# Patient Record
Sex: Male | Born: 1940 | Race: White | Hispanic: No | Smoking: Never smoker
Health system: Southern US, Community
[De-identification: ages and names within clinical notes are randomized; demographics above are authoritative.]

## PROBLEM LIST (undated history)

## (undated) DIAGNOSIS — I480 Paroxysmal atrial fibrillation: Secondary | ICD-10-CM

## (undated) DIAGNOSIS — E782 Mixed hyperlipidemia: Secondary | ICD-10-CM

## (undated) DIAGNOSIS — M1990 Unspecified osteoarthritis, unspecified site: Secondary | ICD-10-CM

## (undated) DIAGNOSIS — I2510 Atherosclerotic heart disease of native coronary artery without angina pectoris: Secondary | ICD-10-CM

## (undated) DIAGNOSIS — Z8701 Personal history of pneumonia (recurrent): Secondary | ICD-10-CM

## (undated) DIAGNOSIS — I219 Acute myocardial infarction, unspecified: Secondary | ICD-10-CM

## (undated) DIAGNOSIS — Z87442 Personal history of urinary calculi: Secondary | ICD-10-CM

## (undated) DIAGNOSIS — R252 Cramp and spasm: Secondary | ICD-10-CM

## (undated) DIAGNOSIS — I10 Essential (primary) hypertension: Secondary | ICD-10-CM

## (undated) DIAGNOSIS — F5221 Male erectile disorder: Secondary | ICD-10-CM

## (undated) DIAGNOSIS — N200 Calculus of kidney: Secondary | ICD-10-CM

## (undated) HISTORY — DX: Essential (primary) hypertension: I10

## (undated) HISTORY — PX: TOOTH EXTRACTION: SUR596

## (undated) HISTORY — DX: Mixed hyperlipidemia: E78.2

## (undated) HISTORY — DX: Male erectile disorder: F52.21

## (undated) HISTORY — DX: Paroxysmal atrial fibrillation: I48.0

## (undated) HISTORY — DX: Personal history of pneumonia (recurrent): Z87.01

## (undated) HISTORY — DX: Atherosclerotic heart disease of native coronary artery without angina pectoris: I25.10

## (undated) HISTORY — PX: CORONARY ANGIOPLASTY: SHX604

## (undated) HISTORY — PX: RETINAL DETACHMENT SURGERY: SHX105

## (undated) HISTORY — PX: BACK SURGERY: SHX140

---

## 1998-07-10 DIAGNOSIS — I219 Acute myocardial infarction, unspecified: Secondary | ICD-10-CM

## 1998-07-10 HISTORY — DX: Acute myocardial infarction, unspecified: I21.9

## 2003-01-21 ENCOUNTER — Encounter: Payer: Self-pay | Admitting: Cardiology

## 2003-01-21 ENCOUNTER — Ambulatory Visit (HOSPITAL_COMMUNITY): Admission: RE | Admit: 2003-01-21 | Discharge: 2003-01-22 | Payer: Self-pay | Admitting: Cardiology

## 2003-03-14 ENCOUNTER — Encounter: Payer: Self-pay | Admitting: Emergency Medicine

## 2003-03-14 ENCOUNTER — Emergency Department (HOSPITAL_COMMUNITY): Admission: EM | Admit: 2003-03-14 | Discharge: 2003-03-14 | Payer: Self-pay | Admitting: Emergency Medicine

## 2003-11-06 ENCOUNTER — Ambulatory Visit (HOSPITAL_COMMUNITY): Admission: RE | Admit: 2003-11-06 | Discharge: 2003-11-06 | Payer: Self-pay | Admitting: Cardiology

## 2004-07-21 ENCOUNTER — Ambulatory Visit: Payer: Self-pay | Admitting: Cardiology

## 2004-09-27 ENCOUNTER — Ambulatory Visit: Payer: Self-pay | Admitting: Gastroenterology

## 2005-07-28 ENCOUNTER — Ambulatory Visit: Payer: Self-pay | Admitting: Cardiology

## 2006-02-15 ENCOUNTER — Emergency Department: Payer: Self-pay

## 2006-03-08 ENCOUNTER — Ambulatory Visit: Payer: Self-pay | Admitting: Urology

## 2006-09-10 ENCOUNTER — Ambulatory Visit: Payer: Self-pay | Admitting: Urology

## 2006-10-11 ENCOUNTER — Ambulatory Visit: Payer: Self-pay | Admitting: Cardiology

## 2006-10-26 ENCOUNTER — Ambulatory Visit: Payer: Self-pay

## 2006-12-11 ENCOUNTER — Ambulatory Visit: Payer: Self-pay | Admitting: Gastroenterology

## 2007-06-12 ENCOUNTER — Ambulatory Visit: Payer: Self-pay | Admitting: Urology

## 2007-11-26 ENCOUNTER — Ambulatory Visit: Payer: Self-pay | Admitting: Cardiology

## 2008-06-10 ENCOUNTER — Ambulatory Visit: Payer: Self-pay | Admitting: Urology

## 2008-06-11 ENCOUNTER — Ambulatory Visit: Payer: Self-pay | Admitting: Internal Medicine

## 2008-09-25 ENCOUNTER — Encounter: Payer: Self-pay | Admitting: Cardiovascular Disease

## 2008-12-14 DIAGNOSIS — E785 Hyperlipidemia, unspecified: Secondary | ICD-10-CM | POA: Insufficient documentation

## 2008-12-14 DIAGNOSIS — I2510 Atherosclerotic heart disease of native coronary artery without angina pectoris: Secondary | ICD-10-CM

## 2008-12-14 DIAGNOSIS — F528 Other sexual dysfunction not due to a substance or known physiological condition: Secondary | ICD-10-CM | POA: Insufficient documentation

## 2008-12-14 DIAGNOSIS — I10 Essential (primary) hypertension: Secondary | ICD-10-CM

## 2008-12-28 ENCOUNTER — Encounter: Payer: Self-pay | Admitting: Internal Medicine

## 2008-12-28 ENCOUNTER — Ambulatory Visit: Payer: Self-pay | Admitting: Internal Medicine

## 2008-12-28 DIAGNOSIS — S60429A Blister (nonthermal) of unspecified finger, initial encounter: Secondary | ICD-10-CM | POA: Insufficient documentation

## 2009-06-14 ENCOUNTER — Ambulatory Visit: Payer: Self-pay | Admitting: Urology

## 2009-12-24 ENCOUNTER — Ambulatory Visit: Payer: Self-pay | Admitting: Cardiovascular Disease

## 2009-12-24 DIAGNOSIS — R5383 Other fatigue: Secondary | ICD-10-CM

## 2009-12-24 DIAGNOSIS — R079 Chest pain, unspecified: Secondary | ICD-10-CM | POA: Insufficient documentation

## 2009-12-24 DIAGNOSIS — R5381 Other malaise: Secondary | ICD-10-CM

## 2009-12-28 ENCOUNTER — Telehealth (INDEPENDENT_AMBULATORY_CARE_PROVIDER_SITE_OTHER): Payer: Self-pay | Admitting: *Deleted

## 2009-12-29 ENCOUNTER — Ambulatory Visit: Payer: Self-pay | Admitting: Cardiology

## 2009-12-29 ENCOUNTER — Encounter (HOSPITAL_COMMUNITY): Admission: RE | Admit: 2009-12-29 | Discharge: 2010-03-16 | Payer: Self-pay | Admitting: Cardiovascular Disease

## 2009-12-29 ENCOUNTER — Encounter: Payer: Self-pay | Admitting: Cardiology

## 2009-12-29 ENCOUNTER — Ambulatory Visit: Payer: Self-pay

## 2010-06-22 ENCOUNTER — Ambulatory Visit: Payer: Self-pay | Admitting: Urology

## 2010-06-28 ENCOUNTER — Encounter: Payer: Self-pay | Admitting: Cardiovascular Disease

## 2010-06-28 ENCOUNTER — Ambulatory Visit: Payer: Self-pay | Admitting: Cardiovascular Disease

## 2010-06-28 DIAGNOSIS — R42 Dizziness and giddiness: Secondary | ICD-10-CM

## 2010-08-09 NOTE — Assessment & Plan Note (Signed)
Summary: Cardiology Nuclear Study  Nuclear Med Background Indications for Stress Test: Evaluation for Ischemia, Stent Patency, PTCA Patency   History: Angioplasty, Heart Catheterization, Myocardial Perfusion Study, Stents  History Comments: '04 PTCA/Stent-RCA x 2; '05 Cath:Moderate, diffuse n/o CAD; '08 MPS:no ischemia, inferior infarct, EF=54%  Symptoms: Chest Pain, Chest Tightness  Symptoms Comments: Last episode of CP:10-days ago.   Nuclear Pre-Procedure Cardiac Risk Factors: Carotid Disease, Hypertension, Lipids, Overweight Caffeine/Decaff Intake: None NPO After: 7:00 PM Lungs: Clear IV 0.9% NS with Angio Cath: 20g     IV Site: (R) AC IV Started by: Irean Hong RN Chest Size (in) 50     Height (in): 74 Weight (lb): 234 BMI: 30.15 Tech Comments: Toprol held > 24 hours.  Nuclear Med Study 1 or 2 day study:  1 day     Stress Test Type:  Stress Reading MD:  Olga Millers, MD     Referring MD:  Julien Nordmann, MD Resting Radionuclide:  Technetium 36m Tetrofosmin     Resting Radionuclide Dose:  11 mCi  Stress Radionuclide:  Technetium 4m Tetrofosmin     Stress Radionuclide Dose:  33 mCi   Stress Protocol Exercise Time (min):  8:00 min     Max HR:  118 bpm     Predicted Max HR:  150 bpm  Max Systolic BP: 172 mm Hg     Percent Max HR:  78.67 %     METS: 10.1 Rate Pressure Product:  [address]    Stress Test Technologist:  Rea College CMA-N     Nuclear Technologist:  Domenic Polite CNMT  Rest Procedure  Myocardial perfusion imaging was performed at rest 45 minutes following the intravenous administration of Myoview Technetium 47m Tetrofosmin.  Stress Procedure  The patient exercised for eight minutes.  The patient stopped due to fatigue and denied any chest pain.  There were no significant ST-T wave changes, only occasional PVC's.  Myoview was injected at peak exercise and myocardial perfusion imaging was performed after a brief delay.  QPS Raw Data Images:  Acuisition  technically good; normal left ventricular size. Stress Images:  There is decreased uptake in the inferior wall. Rest Images:  There is decreased uptake in the inferior wall, less prominent compared to the stress images. Subtraction (SDS):  These findings are consistent with inferior thinning and mild inferior ischemia. Transient Ischemic Dilatation:  1.03  (Normal <1.22)  Lung/Heart Ratio:  .33  (Normal <0.45)  Quantitative Gated Spect Images QGS EDV:  125 ml QGS ESV:  55 ml QGS EF:  56 % QGS cine images:  Normal wall motion.   Overall Impression  Exercise Capacity: Fair exercise capacity. BP Response: Normal blood pressure response. Clinical Symptoms: There is dyspnea. ECG Impression: No significant ST segment change suggestive of ischemia. Overall Impression: Abnormal stress nuclear study with inferior thinning and mild inferior ischemia.  Appended Document: Cardiology Nuclear Study Appears unchanged from previous study  Appended Document: Cardiology Nuclear Study LMOM TCB   Appended Document: Cardiology Nuclear Study pt aware of test results.

## 2010-08-09 NOTE — Assessment & Plan Note (Signed)
Summary: EC6/AMD   Visit Type:  Follow-up Primary Provider:  [name]  CC:  chest pain last sunday when sleeping.  History of Present Illness: [name] is a very pleasant 70 year old male with a history of coronary artery disease.  He is status post previous stenting of the LAD and right coronary artery with drug-eluting stents.  Most recent catheterization was in April 2005, which showed moderate diffuse nonobstructive disease.  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 EF 54%.  He also has a history of hypertension and hyperlipidemia as well as kidney stones.   Typically he does aerobic 4 times per week, does weights 2 times per week. He reports that he likes to eat a lot the burns this on the treadmill. He had an episode of chest pain last week at rest lasting several hours, it was dull with significant tightness. He took a large aspirin and it seemed to go away without having to take any other medications. He also reports feeling hot sometimes when working out on the treadmill. chest pain episode was similar to previous symptoms in 2005.    Current Medications (verified): 1)  Plavix 75 Mg Tabs (Clopidogrel Bisulfate) .... Take 1 Tablet By Mouth Once A Day 2)  Toprol Xl 50 Mg Xr24h-Tab (Metoprolol Succinate) 3)  Zocor 40 Mg Tabs (Simvastatin) .... Take 1 By Mouth At Bedtime 4)  Folic Acid 400 Mcg Tabs (Folic Acid) .... Take 1 By Mouth Once Daily 5)  Aspirin 81 Mg Tbec (Aspirin) .... Take One Tablet By Mouth Daily 6)  Fish Oil 1000 Mg Caps (Omega-3 Fatty Acids) .... Take 1 By Mouth Two Times A Day 7)  Centrum  Tabs (Multiple Vitamins-Minerals) .... Take 1 By Mouth Once Daily 8)  Calcium 600 1500 Mg Tabs (Calcium Carbonate) .... Take 1 By Mouth Once Daily 9)  Ibuprofen 200 Mg Caps (Ibuprofen) .... Take 2-4 As Needed 10)  Allegra Odt 30 Mg Tbdp (Fexofenadine Hcl) .... Take 1 Tablet By Mouth Once A Day As Needed For Allergy  Symptions  Allergies (verified): 1)  ! Codeine  Past History:  Past Medical History: Last updated: 12/28/2008 1. CAD, UNSPECIFIED SITE    --s/p DES to LAD and RCA 2. HYPERLIPIDEMIA-MIXED  3. HYPERTENSION, UNSPECIFIED  4. ERECTILE DYSFUNCTION, NON-ORGANIC   Social History: Last updated: 12/14/2008 Full Time Married  Tobacco Use - No.  Alcohol Use - no Regular Exercise - yes Drug Use - no  Risk Factors: Exercise: yes (12/14/2008)  Risk Factors: Smoking Status: never (12/14/2008)  Review of Systems       The patient complains of chest pain.  The patient denies fever, weight loss, weight gain, vision loss, decreased hearing, hoarseness, syncope, dyspnea on exertion, peripheral edema, prolonged cough, abdominal pain, incontinence, muscle weakness, depression, and enlarged lymph nodes.         Feels hot on the treadmill  Vital Signs:  Patient profile:   69 year old male Height:      74 inches Weight:      239 pounds BMI:     30 .80 Pulse rate:   60 / minute BP sitting:   122 / 80  (left arm) Cuff size:   large  Vitals Entered By: Benedict Needy, RN (December 24, 2009 2:31 PM)  Physical Exam  General:  Well developed, well nourished, in no acute distress. Head:  normocephalic and atraumatic Neck:  Neck supple, no JVD. No masses, thyromegaly or  abnormal cervical nodes. Lungs:  Clear bilaterally to auscultation and percussion. Heart:  Non-displaced PMI, chest non-tender; regular rate and rhythm, S1, S2 without murmurs, rubs or gallops. Carotid upstroke normal, no bruit.  Pedals normal pulses. No edema, no varicosities. Abdomen:  Bowel sounds positive; abdomen soft and non-tender without masses Msk:  Back normal, normal gait. Muscle strength and tone normal. Pulses:  pulses normal in all 4 extremities Extremities:  No clubbing or cyanosis. Neurologic:  Alert and oriented x 3. Skin:  Intact without lesions or rashes. Psych:  Normal affect.   New Orders:     1)  EKG  w/ Interpretation (93000)  Due: 12/24/2009     2)  Nuclear Stress Test (Nuc Stress Test)  Due: 12/24/2009   EKG  Procedure date:  12/24/2009  Findings:      normal sinus rhythm with a rate of 60 beats per minute, first degree AV block. No significant ST or T wave changes.  Impression & Recommendations:  Problem # 1:  CHEST PAIN (ICD-786.50) etiology of his chest pain is concerning for angina. He has known coronary artery disease, History of PCI to the LAD and RCA. history of obesity, hyperlipidemia, hypertension. We have ordered a treadmill Myoview to be done in the next week We will call him with the results.  The following medications were removed from the medication list:    Amlodipine Besylate 5 Mg Tabs (Amlodipine besylate) .Marland Kitchen... Take one tablet by mouth daily His updated medication list for this problem includes:    Plavix 75 Mg Tabs (Clopidogrel bisulfate) .Marland Kitchen... Take 1 tablet by mouth once a day    Toprol Xl 50 Mg Xr24h-tab (Metoprolol succinate)    Aspirin 81 Mg Tbec (Aspirin) .Marland Kitchen... Take one tablet by mouth daily  Orders: Nuclear Stress Test (Nuc Stress Test)  Problem # 2:  HYPERLIPIDEMIA (ICD-272.4) new FDA recommendations have suggested we decrease his Zocor to 20 mg daily as he is on amlodipine. We will discuss this with him in followup with his stress test.  His updated medication list for this problem includes:    Zocor 40 Mg Tabs (Simvastatin) .Marland Kitchen... Take 1 by mouth at bedtime  Problem # 3:  HYPERLIPIDEMIA-MIXED (ICD-272.4) We will try to obtain his most recent lipid panel from Dr. Judithann Sheen. If he is at goal, with LDL less than 70, decreasing his simvastatin to 20 mg daily may not be sufficient and we may need to change him to Lipitor or Crestor.  His updated medication list for this problem includes:    Zocor 40 Mg Tabs (Simvastatin) .Marland Kitchen... Take 1 by mouth at bedtime  Orders: Nuclear Stress Test (Nuc Stress Test)  Other Orders: EKG w/ Interpretation  (93000)  Patient Instructions: 1)  Your physician recommends that you schedule a follow-up appointment in: 6 months 2)  Your physician has requested that you have an exercise stress myoview.  For further information please visit [email or web address]  Please follow instruction sheet, as given.

## 2010-08-09 NOTE — Progress Notes (Signed)
Summary: Nuclear Pre-Procedure  Phone Note Outgoing Call Call back at [facility] Phone [phone]   Call placed by: Stanton Kidney, EMT-P,  December 28, 2009 11:10 AM Action Taken: Phone Call Completed Summary of Call: Left message with information on Myoview Information Sheet (see scanned document for details).     Nuclear Med Background Indications for Stress Test: Evaluation for Ischemia, Stent Patency   History: Angioplasty, Heart Catheterization, Myocardial Perfusion Study, Stents  History Comments: '04 Angioplasty: RCA x2 > Stents: RCA x2 '05 Heart Cath: Mod. diffuse N/O CAD '08 MPS: EF=54%, (-) ischemia, inf. infarct  Symptoms: Chest Pain, Chest Tightness    Nuclear Pre-Procedure Cardiac Risk Factors: Carotid Disease, Hypertension, Lipids Height (in): 74

## 2010-08-11 NOTE — Assessment & Plan Note (Signed)
Summary: F6M/AMD   Visit Type:  Follow-up Primary [name]:  [name]  CC:  c/o mild chest pain for several months.  .  History of Present Illness: [name] is a very pleasant 70 year old male with a history of coronary artery disease.  He is status post previous stenting of the LAD and right coronary artery with drug-eluting stents.  Most recent catheterization was in April 2005, which showed moderate diffuse nonobstructive disease.  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 EF 54%.  He also has a history of hypertension and hyperlipidemia as well as kidney stones. He presents for routine follow up.  [name] reports that he has had dizziness with lying down and sometimes sitting up. Sometimes he notices this when he lies down to do bench press at the gym. He continues to walk on the treadmill and has no symptoms. He works out 4 times per week. No shortness of breath. He works for at least 4 miles at a time. he has had a recent sinus infection  He also reports having a little bit of chest tightness that comes on at night time or typically at rest. This is very light it does not come on with exertion  EKG shows normal sinus rhythm with rate of 59 beats per minute, first degree AV block, no other significant ST or T wave changes  Labs from March 2010 showed total cholesterol 131, LDL 85, HDL 38    Current Medications (verified): 1)  Plavix 75 Mg Tabs (Clopidogrel Bisulfate) .... Take 1 Tablet By Mouth Once A Day 2)  Toprol Xl 50 Mg Xr24h-Tab (Metoprolol Succinate) .... One Tablet Once Daily 3)  Zocor 40 Mg Tabs (Simvastatin) .... Take 1 By Mouth At Bedtime 4)  Folic Acid 400 Mcg Tabs (Folic Acid) .... Take 1 By Mouth Once Daily 5)  Aspirin 81 Mg Tbec (Aspirin) .... Take One Tablet By Mouth Daily 6)  Fish Oil 1000 Mg Caps (Omega-3 Fatty Acids) .... Take 1 By Mouth Two Times A Day 7)  Centrum  Tabs (Multiple Vitamins-Minerals)  .... Take 1 By Mouth Once Daily  Allergies (verified): 1)  ! Codeine  Past History:  Past Medical History: Last updated: 12/28/2008 1. CAD, UNSPECIFIED SITE    --s/p DES to LAD and RCA 2. HYPERLIPIDEMIA-MIXED  3. HYPERTENSION, UNSPECIFIED  4. ERECTILE DYSFUNCTION, NON-ORGANIC   Social History: Last updated: 12/14/2008 Full Time Married  Tobacco Use - No.  Alcohol Use - no Regular Exercise - yes Drug Use - no  Risk Factors: Exercise: yes (12/14/2008)  Risk Factors: Smoking Status: never (12/14/2008)  Review of Systems       The patient complains of chest pain.  The patient denies fever, weight loss, weight gain, vision loss, decreased hearing, hoarseness, syncope, dyspnea on exertion, peripheral edema, prolonged cough, abdominal pain, incontinence, muscle weakness, depression, and enlarged lymph nodes.         Dizzy  Vital Signs:  Patient profile:   70 year old male Height:      74 inches Weight:      238 pounds BMI:     30.67 Pulse rate:   59 / minute BP sitting:   154 / 92  (left arm) Cuff size:   large  Vitals Entered By: Bishop Dublin, CMA (June 28, 2010 10:54 AM)  Physical Exam  General:  Well developed, well nourished, in no acute distress. Head:  normocephalic and atraumatic Neck:  Neck supple, no JVD. No masses, thyromegaly or abnormal cervical nodes. Lungs:  Clear bilaterally to auscultation and percussion. Heart:  Non-displaced PMI, chest non-tender; regular rate and rhythm, S1, S2 without murmurs, rubs or gallops. Carotid upstroke normal, no bruit.  Pedals normal pulses. No edema, no varicosities. Abdomen:  Bowel sounds positive; abdomen soft and non-tender without masses Msk:  Back normal, normal gait. Muscle strength and tone normal. Pulses:  pulses normal in all 4 extremities Extremities:  No clubbing or cyanosis. Neurologic:  Alert and oriented x 3. Skin:  Intact without lesions or rashes. Psych:  Normal affect.   Impression &  Recommendations:  Problem # 1:  CHEST PAIN (ICD-786.50) Symptoms are atypical in nature. He is exercising with no symptoms. I have suggested that we monitor him for now.  His updated medication list for this problem includes:    Plavix 75 Mg Tabs (Clopidogrel bisulfate) .Marland Kitchen... Take 1 tablet by mouth once a day    Toprol Xl 50 Mg Xr24h-tab (Metoprolol succinate) ..... One tablet once daily    Aspirin 81 Mg Tbec (Aspirin) .Marland Kitchen... Take one tablet by mouth daily  Problem # 2:  DIZZINESS (ICD-780.4) Dizziness with lying back is likely secondary to vestibular/inner ear disease. Unlikely to be cardiac. He does have a recent sinus infection.  Problem # 3:  CAD, UNSPECIFIED SITE (ICD-414.00) continue aggressive lipid management. No further testing at this time.  His updated medication list for this problem includes:    Plavix 75 Mg Tabs (Clopidogrel bisulfate) .Marland Kitchen... Take 1 tablet by mouth once a day    Toprol Xl 50 Mg Xr24h-tab (Metoprolol succinate) ..... One tablet once daily    Aspirin 81 Mg Tbec (Aspirin) .Marland Kitchen... Take one tablet by mouth daily  Problem # 4:  HYPERLIPIDEMIA-MIXED (ICD-272.4) We have suggested to him that he have a new lipid panel as his last was March 2010.  His updated medication list for this problem includes:    Zocor 40 Mg Tabs (Simvastatin) .Marland Kitchen... Take 1 by mouth at bedtime  Patient Instructions: 1)  Your physician recommends that you schedule a follow-up appointment in: 6 months 2)  Your physician recommends that you continue on your current medications as directed. Please refer to the Current Medication list given to you today. 3)  Your physician has requested that you regularly monitor and record your blood pressure readings at home.  Please use the same machine at the same time of day to check your readings and record them to bring to your follow-up visit.

## 2010-08-12 ENCOUNTER — Encounter: Payer: Self-pay | Admitting: Cardiovascular Disease

## 2010-08-12 ENCOUNTER — Ambulatory Visit (INDEPENDENT_AMBULATORY_CARE_PROVIDER_SITE_OTHER): Payer: MEDICARE | Admitting: Cardiovascular Disease

## 2010-08-12 DIAGNOSIS — I10 Essential (primary) hypertension: Secondary | ICD-10-CM

## 2010-08-12 DIAGNOSIS — I2510 Atherosclerotic heart disease of native coronary artery without angina pectoris: Secondary | ICD-10-CM

## 2010-08-12 DIAGNOSIS — E785 Hyperlipidemia, unspecified: Secondary | ICD-10-CM

## 2010-08-17 ENCOUNTER — Telehealth: Payer: Self-pay | Admitting: Cardiovascular Disease

## 2010-08-17 NOTE — Assessment & Plan Note (Signed)
Summary: ELEVATED BP/ 183/99 /AMD   Vital Signs:  Patient profile:   70 year old male Height:      74 inches Weight:      240 pounds BMI:     30.93 Pulse rate:   64 / minute BP sitting:   158 / 89  (left arm) Cuff size:   large  Vitals Entered By: Bishop Dublin, CMA (August 12, 2010 2:47 PM)  Visit Type:  Follow-up Primary Provider:  [name]  CC:  Elevated blood pressure and dizziness and headache..  History of Present Illness: 70 year old male with a history of coronary artery disease.  He is status post previous stenting of the LAD and right coronary artery with drug-eluting stents.  Most recent catheterization was in April 2005, which showed moderate diffuse nonobstructive disease.  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 EF 54%.  He also has a history of hypertension and hyperlipidemia as well as kidney stones. He presents for routine follow up.  He has had problems with chronic dizziness and ringing in his ears. He believes this is secondary to an inner ear problem. Overall he feels well though he is very concerned about hypertension recently. He reports a systolic pressure in the 180 range recently. He has not been checking this at home as he does not have a blood pressure cuff. He would like to start a medication for his blood pressure.  He denies any significant chest pain. He has been using a treadmill several times per week with no symptoms of shortness of breath or discomfort. his weight has been increasing over the past several years. He does eat out frequently/most nights.  old EKG shows normal sinus rhythm with rate of 59 beats per minute, first degree AV block, no other significant ST or T wave changes  Labs from March 2010 showed total cholesterol 131, LDL 85, HDL 38    Current Medications (verified): 1)  Plavix 75 Mg Tabs (Clopidogrel Bisulfate) .... Take 1 Tablet By Mouth Once A Day 2)  Toprol Xl 50  Mg Xr24h-Tab (Metoprolol Succinate) .... One Tablet Once Daily 3)  Zocor 40 Mg Tabs (Simvastatin) .... Take 1 By Mouth At Bedtime 4)  Folic Acid 400 Mcg Tabs (Folic Acid) .... Take 1 By Mouth Once Daily 5)  Aspirin 81 Mg Tbec (Aspirin) .... Take One Tablet By Mouth Daily 6)  Fish Oil 1000 Mg Caps (Omega-3 Fatty Acids) .... Take 1 By Mouth Two Times A Day 7)  Centrum  Tabs (Multiple Vitamins-Minerals) .... Take 1 By Mouth Once Daily  Allergies (verified): 1)  ! Codeine  Past History:  Past Medical History: Last updated: 12/28/2008 1. CAD, UNSPECIFIED SITE    --s/p DES to LAD and RCA 2. HYPERLIPIDEMIA-MIXED  3. HYPERTENSION, UNSPECIFIED  4. ERECTILE DYSFUNCTION, NON-ORGANIC   Social History: Last updated: 12/14/2008 Full Time Married  Tobacco Use - No.  Alcohol Use - no Regular Exercise - yes Drug Use - no  Risk Factors: Exercise: yes (12/14/2008)  Risk Factors: Smoking Status: never (12/14/2008)  Review of Systems  The patient denies fever, weight loss, weight gain, vision loss, decreased hearing, hoarseness, chest pain, syncope, dyspnea on exertion, peripheral edema, prolonged cough, abdominal pain, incontinence, muscle weakness, depression, and enlarged lymph nodes.         dizziness and ringing in his ears  Physical Exam  General:  Well developed, well nourished, in no acute distress.obese Head:  normocephalic and atraumatic Neck:  Neck supple, no JVD. No masses, thyromegaly or abnormal cervical nodes. Lungs:  Clear bilaterally to auscultation and percussion. Heart:  Non-displaced PMI, chest non-tender; regular rate and rhythm, S1, S2 without murmurs, rubs or gallops. Carotid upstroke normal, no bruit.  Pedals normal pulses. No edema, no varicosities. Abdomen:  Bowel sounds positive; abdomen soft and non-tender without masses Msk:  Back normal, normal gait. Muscle strength and tone normal. Pulses:  pulses normal in all 4 extremities Extremities:  No clubbing or  cyanosis. Neurologic:  Alert and oriented x 3. Skin:  Intact without lesions or rashes. Psych:  Normal affect.   Impression & Recommendations:  Problem # 1:  HYPERTENSION, UNSPECIFIED (ICD-401.9) his blood pressure is elevated. We will start him on lisinopril 20 mg daily. Alternatively, we could try HCTZ or amlodipine. I've asked him to monitor his blood pressure and call us in the next 2 weeks to confirm his blood pressure is within an adequate range  His updated medication list for this problem includes:    Toprol Xl 50 Mg Xr24h-tab (Metoprolol succinate) ..... One tablet once daily    Aspirin 81 Mg Tbec (Aspirin) .Marland Kitchen... Take one tablet by mouth daily    Lisinopril 20 Mg Tabs (Lisinopril) .Marland Kitchen... 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  Problem # 3:  HYPERLIPIDEMIA (ICD-272.4) Continue Zocor. We'll try to obtain the most recent cholesterol from Dr. Judithann Sheen.  His updated medication list for this problem includes:    Zocor 40 Mg Tabs (Simvastatin) .Marland Kitchen... Take 1 by mouth at bedtime  Problem # 4:  CAD, UNSPECIFIED SITE (ICD-414.00) Continue aggressive medical management and lipid control.  His updated medication list for this problem includes:    Plavix 75 Mg Tabs (Clopidogrel bisulfate) .Marland Kitchen... Take 1 tablet by mouth once a day    Toprol Xl 50 Mg Xr24h-tab (Metoprolol succinate) ..... One tablet once daily    Aspirin 81 Mg Tbec (Aspirin) .Marland Kitchen... Take one tablet by mouth daily    Lisinopril 20 Mg Tabs (Lisinopril) .Marland Kitchen... Take one tablet by mouth daily  Patient Instructions: 1)  Your physician recommends that you schedule a follow-up appointment in: 6 months 2)  Your physician has recommended you make the following change in your medication: START Lisinopril 20mg  once daily. Prescriptions: LISINOPRIL 20 MG TABS (LISINOPRIL) Take one tablet by mouth daily  #30 x 6   Entered by:   Lanny Hurst RN    Authorized by:   Dossie Arbour MD   Signed by:   Lanny Hurst RN on 08/12/2010   Method used:   Electronically to        [facility] [address] (retail)       [address]       [place], [place]  [address]       Ph: [phone]       Fax: [phone]   RxID:   [id number]

## 2010-08-31 NOTE — Progress Notes (Signed)
Summary: [facility]  Called pt, LMOM TCB. Lanny Hurst RN  August 17, 2010 3:59 PM   ---- Converted from flag ---- ---- 08/16/2010 9:17 PM, Dossie Arbour MD wrote: maybe just have him  go to [facility] for lipids/LFTs    ---- 08/15/2010 9:27 AM, Lanny Hurst RN wrote: Requested labs from Dr. Judithann Sheen, they have no record of chol levels. Just FYI. Will contact pt to see if another MD had done these. Thanks, Megan  Pt thinks he did have chol drawn at Dr. Judithann Sheen, pt will call there and let us know. If no results, pt will have them drawn at [facility]. Lanny Hurst RN  August 18, 2010 10:39 AM  ------------------------------

## 2010-11-22 NOTE — Assessment & Plan Note (Signed)
[facility] OFFICE NOTE   NAME:[name], [name]                     MRN:          [id number]  DATE:11/26/2007                            DOB:          08-14-1940    [name] is a pleasant 70 year old male who has a history of coronary  disease and he has had prior drug-eluting stent to his right coronary  artery.  His most recent catheterization was performed on November 06, 2003.  At that time he was found to have minor irregularities in his  left main.  There was a 60% ostial LAD stenosis.  The stent in the  proximal LAD had no significant restenosis.  There was a 50% mid LAD.  The circumflex had a large first obtuse marginal which had a 70%  stenosis in the mid area.  A second smaller second obtuse marginal had a  90% stenosis at origin.  There was a 60-70% stenosis in proximal right  coronary artery but there was no restenosis in the stent.  The PDA had a  70-80% lesion.  He has been treated medically.  His most recent Myoview  was performed on October 26, 2006.  At that time there was a prior  inferior infarct versus diaphragmatic attenuation but there was no  ischemia noted.  His ejection fraction was 54%.  Since he was seen  previously he has done well from a symptomatic standpoint.  There is no  dyspnea, chest pain, palpitations or syncope, and there is no  claudication.  Note, he does not smoke.  He does exercise routinely and  does follow a diet as well.   MEDICATIONS:  1. Plavix 75 mg p.o. daily.  2. Toprol 50 mg p.o. daily.  3. Folate daily.  4. Calcium.  5. Fish oil 1 g p.o. daily.  6. Aspirin 81 mg p.o. daily.  7. Zocor 40 mg p.o. daily.  8. Multivitamin.   Physical exam today shows a blood pressure of 155/86 and his pulse [place]  53.  He weighs 224 pounds.  HEENT:  Normal.  NECK:  Supple.  There are no bruits noted.  CHEST:  Clear.  CARDIOVASCULAR:  A bradycardic rate with a regular rhythm.   There are no  murmurs, rubs or gallops noted.  ABDOMEN:  No pulsatile mass and no bruit.  EXTREMITIES:  No edema.   Electrocardiogram shows a sinus bradycardia at a rate of 53.  The axis  is normal.  There is a first-degree AV block.  There are no ST changes  noted.   DIAGNOSES:  1. Coronary artery disease.  [name] is doing well from a      symptomatic standpoint with no chest pain or shortness of breath.      His most recent Myoview showed no ischemia and preserved left      ventricular function.  We will continue with medical therapy      including his aspirin, Plavix, beta blocker and statin.  Note, he      will continue with risk factor modification.  He does not smoke.  He is exercising routinely and following a diet.  2. Hypertension.  His blood pressure is mildly elevated today.      However, he states it typically runs better than this.  He will      track this at home.  I have instructed him to contact us if the      systolic is greater than 130 or diastolic greater than 85.  If so,      we could add an ACE inhibitor in the future.  3. Hyperlipidemia.  He will continue on his statin and fish oil and we      will have his most recent lipids and liver forward to [place] from Dr.      Judithann Sheen' office.   He will return to the [place] office in 1 year.     Madolyn Frieze Jens Som, MD, [facility]  Electronically Signed    BSC/MedQ  DD: 11/26/2007  DT: 11/26/2007  Job #: [id number]   cc:   Alla German, MD

## 2010-11-22 NOTE — Assessment & Plan Note (Signed)
[facility] OFFICE NOTE   NICOLAI, LABONTE                     MRN:          [id number]  DATE:06/11/2008                            DOB:          [birth year]    PRIMARY CARE PHYSICIAN:  Duane Lope. Judithann Sheen, MD   INTERVAL HISTORY:  [name] is a very pleasant 70 year old male with  a history of coronary artery disease.  He is status post previous  stenting of the LAD and right coronary artery.  This was with drug-  eluting stents.  Most recent catheterization was in April 2005, which  showed moderate diffuse nonobstructive disease.  He did have a Myoview  in April 2008 which showed a 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  There is an EF of 54%.  He also has a history of hypertension and  hyperlipidemia as well as kidney stones.   He presents today to discuss the possibility of starting Viagra for  erectile dysfunction.  He discussed this Dr. Judithann Sheen who referred him  over here for our opinion.  At baseline, he is very active.  He walks 4-  7 miles everyday on the treadmill at 4.5 miles an hour at 10% grade.  He  denies any chest pain or shortness of breath with this at all.  He does  take Imdur at 30 mg a day.  He has not had any problems with heart  failure or other problems.   CURRENT MEDICATIONS:  1. Plavix 75 a day.  2. Toprol 50 a day.  3. Folic acid.  4. Calcium.  5. Fish oil.  6. Aspirin 81.  7. Zocor 40.  8. Imdur 30.  9. Centrum.  10.Multivitamin.   PHYSICAL EXAMINATION:  GENERAL:  He is well appearing in no acute  distress.  Ambulates around the clinic without any respiratory  difficulty.  VITAL SIGNS:  Blood pressure is 158/84, heart rate is 56, weight is 226.  HEENT:  Normal.  NECK:  Supple.  No JVD.  Carotids are 2+ bilaterally without any bruits.  There is no lymphadenopathy or thyromegaly.  CARDIAC:  PMI is nondisplaced.  Regular rate and rhythm.  No murmurs,  rubs, or  gallops.  LUNGS:  Clear.  ABDOMEN:  Obese, nontender, nondistended.  No hepatosplenomegaly.  No  bruits.  No masses.  Good bowel sounds.  EXTREMITIES:  Warm with no cyanosis, clubbing, or edema.  No rash.  NEURO:  Alert and oriented x3.  Cranial nerves II through XII are  intact.  Moves all 4 extremities without difficulty.  Affect is  pleasant.   EKG shows sinus bradycardia at a rate of 55 and a first-degree AV block.  No ST-T wave abnormalities.   ASSESSMENT AND PLAN:  1. Coronary artery disease.  This is stable.  No evidence of ischemia.      He has excellent exercise tolerance.  2. Erectile dysfunction.  Given his excellent exercise capacity with      no ischemic symptoms, I feel fine stopping his Imdur and starting      him on Viagra.  We will  start him on 50 mg a day and he can titrate      to 100 as needed.  I have made it very clear that he is never to      taking Imdur and nitroglycerin together.  3. Hypertension.  I suggested that we probably start an ACE inhibitor.      He would like to discuss this with Dr. Judithann Sheen first.  We would      consider lisinopril 10 mg a day.  4. Hyperlipidemia, followed by Dr. Judithann Sheen.  Goal LDL is less than 70.     Bevelyn Buckles. Bensimhon, MD  Electronically Signed    DRB/MedQ  DD: 06/11/2008  DT: 06/12/2008  Job #: [id number]   cc:   Duane Lope. Judithann Sheen, MD

## 2010-11-25 NOTE — Cardiovascular Report (Signed)
NAMEEVIE, CRUMPLER                        ACCOUNT NO.:  [id number]   MEDICAL RECORD NO.:  [id number]                   PATIENT TYPE:  OIB   LOCATION:  2899                                 FACILITY:  [facility]   PHYSICIAN:  Carole Binning, M.D. [facility]         DATE OF BIRTH:  1941-03-12   DATE OF PROCEDURE:  11/06/2003  DATE OF DISCHARGE:  11/06/2003                              CARDIAC CATHETERIZATION   PROCEDURES PERFORMED:  1. Left heart catheterization with,  2. Coronary angiography and  3. Left ventriculography.   CARDIOLOGIST:  Carole Binning, M.D.   INDICATIONS:  Mr. Mccaskey is a 70 year old male with history of coronary  artery disease and prior stents to the left anterior descending artery and  right coronary artery.  His most recent procedure was in December 2004 at  which time he had two drug-eluting stents placed in the right coronary  artery.  He presented to the office two days ago with symptoms of  progressive chest pain similar to his previous angina.  He was therefore  referred for cardiac catheterization.   PROCEDURAL NOTE:  A 6 French sheath was placed in the right femoral artery.  Coronary angiography was performed using standard Judkins 6 French  catheters.  Left ventriculography was performed with an angled pigtail  catheter.   CONTRAST MATERIAL:  Contrast was Omnipaque.   COMPLICATIONS:  There were no complications.   RESULTS:   HEMODYNAMIC DATA:  Left ventricular pressure 136/12.  Aortic pressure  158/90.  There is no aortic valve gradient.   VENTRICULOGRAPHIC DATA:  Left Ventriculogram:  Wall motion is normal.  Ejection fraction was estimated at greater than or equal to 65%.   ARTERIOGRAPHIC DATA:  Coronary Arteriography (Right Dominant)  Left Main:  The left main has minor luminal irregularities.   Left Anterior Descending Artery:  The left anterior descending artery has a  60% stenosis in the ostium.  There is a stent in the proximal left  anterior  descending, which is patent with less than 10% residual stenosis in the  stent.  Just beyond the stent there is a 50% stenosis in the mid LAD.  Further down in the mid and distal LAD there are serial 50% stenoses.  The  LAD gives rise to two small diagonal branches.   Left Circumflex:  The left circumflex gives rise to a large first obtuse  marginal, which has a 70% stenosis in the mid body.  There is a small second  obtuse marginal with a 90% stenosis at its origin and a very small obtuse  marginal branch.   Right Coronary Artery:  The right coronary artery is a dominant vessel.  There is a 60-70% stenosis in the proximal right coronary artery.  There is  a stent in the midvessel with less than 10% stenosis within the stent.  There is a stent in the distal vessel with less than 10% stenosis within the  stent.  The distal right coronary artery gives rise to a normal-sized  posterior descending artery, which has a somewhat 70-80% stenosis at its  origin.  There is also a normal-sized first posterolateral branch and a  small second posterolateral branch arising from the distal right coronary  artery.   In comparison to the catheterization films from November 2004 the stents  that were placed at that time are widely patent.  The disease that is seen  now was present previously.  Thee are no obvious new significant lesions  identified.   IMPRESSION:  1. Normal left ventricular systolic function.  2. Moderate three-vessel coronary artery disease of borderline severity,     which is not significantly changed from last catheterization in November     2004.   PLAN:  We will proceed with a stress Cardiolite to identify and localize  ischemia.  This will dictate our further management.                                               Carole Binning, M.D. [facility]    MWP/MEDQ  D:  11/06/2003  T:  11/06/2003  Job:  [id number]   cc:   Learta Codding, M.D. [facility]   Cardiac Catheterization  Laboratory   Aram Beecham, M.D.

## 2010-11-25 NOTE — Cardiovascular Report (Signed)
NAMELANKFORD, GUTZMER                        ACCOUNT NO.:  [id number]   MEDICAL RECORD NO.:  [id number]                   PATIENT TYPE:  OIB   LOCATION:  2855                                 FACILITY:  [facility]   PHYSICIAN:  Veneda Melter, M.D.                   DATE OF BIRTH:  [birth year]   DATE OF PROCEDURE:  01/21/2003  DATE OF DISCHARGE:                              CARDIAC CATHETERIZATION   PROCEDURES PERFORMED:  1. PTCA and stent placement to mid right coronary artery.  2. PTCA and stent placement to distal right coronary artery.  3. Perclose of right femoral artery.   DIAGNOSES:  1. Coronary atherosclerotic disease.  2. Angina pectoris.   HISTORY:  Mr. Syme is a 70 year old white male with a history of coronary  disease who has previously undergone percutaneous intervention to the right  coronary artery.  He presents with substernal chest discomfort with stress  imaging studies showing ischemia in the inferior wall.  Cardiac  catheterization by Dr. Andee Lineman showed sequential narrowing in the mid and  distal right coronary artery of a high-grade nature with noncritical disease  in the left coronary system and well-preserved LV function.  He is referred  for percutaneous intervention.   TECHNIQUE:  Informed consent had been obtained.  An existing 6 French sheath  in the right groin was upsized to a 7 [place] sheath.  The patient had been  pretreated with aspirin and Plavix, and he was given heparin to maintain ACT  of 280 seconds.  A 7 [place] guide catheter was then used to engage the  right coronary artery and selective guide shots obtained which confirmed the  presence of a high-grade narrowing of 70% in the mid right coronary artery  after the RV marginal branch and the previously stented segment and a distal  high-grade narrowing of 80-90% prior to the PDA.  A 0.014 extra-support wire  was introduced and advanced into the distal RCA, and a 3.0 x 15-mm Maverick  balloon was introduced.  This was used to predilate the distal lesion at 8  atmospheres for 30 seconds and brought back to predilate the mid lesion at 8  atmospheres for 30 seconds.  Repeat angiography showed significant  improvement in distal lesion with only 30-40% residual narrowing.  There was  50-60% residual narrowing in the mid lesion.  No vessel damage had occurred.  A 3.0 x 18-mm Cypher stent was introduced and carefully positioned in the  distal RCA and deployed at 14 atmospheres for 45 seconds.  A 3.5 x 18-mm  Cypher stent was introduced and carefully positioned in the mid RCA and  deployed at 12 atmospheres for 45 seconds.  A 3.5 x 15-mm Quantum Maverick  balloon was then used to postdilate the mid RCA stent.  Two inflations were  performed at 14 atmospheres for 30 seconds.  A 3.25 x 15-mm Quantum Maverick  balloon  was used to dilate the distal stent.  Two inflations were performed  at 14 atmospheres for 30 seconds.  Repeat angiography was then performed  after administration of intracoronary nitroglycerin showing excellent  result, with full coverage of both lesions, no vessel damage, and TIMI 3  flow through the RCA.  The patient tolerated the procedure well without  chest discomfort.  The guide catheter was then removed due to oozing in the  right groin.  A Perclose suture closure device was deployed.  Adequate  hemostasis was achieved, and manual pressure was held for five minutes for  subcutaneous oozing.  Lidocaine 1% with epinephrine was also injected  locally at the site.  Hemostasis was achieved and the patient transferred to  the holding area in stable condition.   FINAL RESULTS:  1. Successful PTCA and stent placement to the mid right coronary artery with     reduction of 70% narrowing to 0% and placement of a 3.5 x 18-mm Cypher     stent.  2. Successful PTCA and stent placement to the distal right coronary artery     with reduction of 80% narrowing to 0% and  placement of a 3.0 x 18-mm     Cypher stent dilated to 3.25 mm.   ASSESSMENT AND PLAN:  Mr. Simkins is a 70 year old gentleman with severe  disease of the right coronary artery status post placement of two drug-  eluting stents.  He should be continued on Plavix for a minimum of six  months' time.  Aggressive risk factor modification will also be pursued.                                               Veneda Melter, M.D.    Melton Alar  D:  01/21/2003  T:  01/21/2003  Job:  [id number]  Judithann Sheen, M.D.   Learta Codding, M.D.  [address]  [address]  [place]  [place] [address]   cc:   Judithann Sheen, M.D.   Learta Codding, M.D.  [address]  [address]  [place]  [place] [address]

## 2010-11-25 NOTE — Assessment & Plan Note (Signed)
[facility]                            CARDIOLOGY OFFICE NOTE   [name], [name]                     MRN:          [id number]  DATE:10/11/2006                            DOB:          January 30, 1941    [name] returns for follow up today.  He is a pleasant gentleman,  and has a history of coronary disease.  Some call in and previously  followed by Dr. Andee Lineman.  He has had previous and drug-eluting stents to  his right coronary artery.  His most recent nuclear study on Nov 11, 2003, showed no scar or ischemia and ejection fraction was 66%.  Since  he was last seen he denies any chest pain, palpitations, dyspnea, or  pedal edema.  There is no claudication.  He is exercising routinely and  following a diet.  He does not smoke.   MEDICATIONS INCLUDE:  1. Plavix 75 mg p.o. daily.  2. Toprol 50 mg p.o. daily.  3. Folate.  4. Calcium.  5. Fish oil 1000 mg p.o. daily.  6. Aspirin 81 mg p.o. daily.  7. Zocor 40 mg p.o. q.h.s.  8. Imdur 30 mg p.o. daily.  9. A multivitamin.   PHYSICAL EXAM:  VITAL SIGNS:  Blood pressure of 142/73, and his pulse is  60.  NECK:  Supple.  There are no bruits.  CHEST:  Clear.  CARDIOVASCULAR EXAM:  Reveals a regular rate and rhythm.  There are no  murmurs, rubs, or gallops noted.  ABDOMINAL EXAM:  Shows no pulsatile masses, and no bruits.  EXTREMITIES:  Showed no edema.   ELECTROCARDIOGRAM:  Shows a sinus rhythm at a rate of 60.  There is a  first-degree AV block.  There are no ST changes noted.   DIAGNOSES:  1. Coronary artery disease status post drug-eluting stent to the right      coronary artery and 2004 -- no recent symptoms of chest pain, or      shortness of breath.  We will plan to risk stratify with a stress      Myoview is it has been three years since his previous study.  If it      shows no change, then we will continue with medical therapy.  2. Hypertension -- blood pressure reasonably well controlled  on      present medications.  3. Hyperlipidemia -- we will have his most recent lipids and liver      studies forwarded to [place] from      Dr. Judithann Sheen' office.  Our goal LDL should be less than 70, given his      history of coronary disease.  I will see him back in our [place]      office in 12 months.     Madolyn Frieze Jens Som, MD, [facility]  Electronically Signed    BSC/MedQ  DD: 10/11/2006  DT: 10/11/2006  Job #: [id number]   cc:   Duane Lope. Judithann Sheen, M.D.

## 2010-11-25 NOTE — Discharge Summary (Signed)
[name], [name]                        ACCOUNT NO.:  [id number]   MEDICAL RECORD NO.:  [id number]                   PATIENT TYPE:  OIB   LOCATION:  6525                                 FACILITY:  [facility]   PHYSICIAN:  Learta Codding, M.D.                 DATE OF BIRTH:  1940/09/02   DATE OF ADMISSION:  01/21/2003  DATE OF DISCHARGE:  01/22/2003                                 DISCHARGE SUMMARY   DISCHARGE DIAGNOSES:  1. Coronary artery disease.     a. Abnormal Cardiolite on January 05, 2003, with inferior wall ischemia.     b. Status post Cypher stent x 2 to the mid and distal right coronary        artery.     c. Patent prior stent to the mid right coronary artery at catheterization        this admission.     d. Ejection fraction 60%.     e. History of non-Q-wave myocardial infarction.  2. Treated hyperlipidemia.  3. Impaired fasting glucose.  4. History of carotid artery disease.   PROCEDURES PERFORMED THIS ADMISSION:  1. Cardiac catheterization by Learta Codding, M.D., on January 21, 2003:  Left     main with no flow limiting CAD, proximal LAD 50-60%, OM1 60-70%, RCA     patent stent, 90% mid and 99% distal stenoses, no MR, EF 60%.  2. Status post percutaneous coronary intervention by Veneda Melter, M.D., on     January 21, 2003:  PTCA and stent with a Cypher stent x 2 to the mid and     distal RCA.   HISTORY OF PRESENT ILLNESS:  [name] is a 70 year old male with a  history of non-Q-wave myocardial infarction and stenting to the LAD, who was  referred by Dr. Judithann Sheen for both resting and exertional chest pain.  A  Cardiolite stress study was positive for reversible defect in the inferior  wall consistent with ischemia.  Therefore, he was referred for elective  cardiac catheterization.  This was performed on January 21, 2003.  The results  are noted above.   HOSPITAL COURSE:  He was subsequently admitted for percutaneous coronary  intervention with a Cypher stent x 2 to the mid  and distal RCA as noted  above.  He tolerated the procedure well and had no immediate complications.  On the morning of January 22, 2003, he was felt to be ready for discharge to  home by Dr. Andee Lineman.  Dr. Andee Lineman did note that the patient had mildly  elevated blood sugars.  He felt that the patient was exhibiting signs of  impaired fasting glucose and that he should have a followup blood sugar and  hemoglobin A1C in about six months.  He will need ACE inhibitors and this  can be done by his primary care physician at follow-up appointment.  He  recommends enalapril 2.5 mg b.i.d.  or Altace 2.5 mg daily.  The patient's  Zocor was increased to 40 mg q.h.s.  Diet and exercise were both discussed  with the patient.  His Imdur was discontinued.  He was discharged to home on  January 22, 2003, in stable condition.   LABORATORY DATA:  White count 6000, hemoglobin 13.1, hematocrit 37.3,  platelet count 151,000.  INR 1.0.  Sodium 143, potassium 4.0, chloride 109,  CO2 28, glucose 114, BUN 16, creatinine 1.1, calcium 8.7.  Post procedure CK-  MB 1.2.  Chest x-ray with no active disease.   DISCHARGE MEDICATIONS:  1. Plavix 75 mg daily.     a. The patient should be continued on Plavix and aspirin indefinitely.  2. Toprol XL 50 mg daily.  3. Zocor 40 mg q.h.s.  4. Aspirin 325 mg daily.  5. Nitroglycerin p.r.n. chest pain.  6. Folic acid 400 mcg daily.  7. Vioxx 25 mg daily as needed.  8. Calcium 600 mg daily.  9. Centrum vitamins daily.   PAIN MANAGEMENT:  1. Tylenol as needed.  2. Nitroglycerin as needed for chest pain.   SPECIAL INSTRUCTIONS:  He is to call our office or 911 with recurrent chest  pain.   ACTIVITY:  No driving.  No lifting, exertion, work, or sex for three days.   DIET:  Low salt, low fat, carbohydrate modified.  He will be given  information prior to discharge on diet.   WOUND CARE:  He is to call our office for any groin swelling, bleeding, or  bruising.   FOLLOWUP:  He is  to follow up with Dr. Margarita Mail physician assistant on January 29, 2003, at 3 p.m.  He will follow up with Dr. Andee Lineman in three to six  months and will be contacted with an appointment.  He should contact Dr.  Judithann Sheen for a follow-up appointment in the next two to three weeks.     Tereso Newcomer, P.A.                        Learta Codding, M.D.    SW/MEDQ  D:  01/22/2003  T:  01/22/2003  Job:  [id number]   cc:   Aram Beecham, M.D.   Learta Codding, M.D.  [address]  [address]  [place]  [place] [address]    cc:   Aram Beecham, M.D.   Learta Codding, M.D.  [address]  [address]  [place]  [place] [address]

## 2010-12-26 ENCOUNTER — Encounter: Payer: Self-pay | Admitting: Cardiovascular Disease

## 2011-01-11 ENCOUNTER — Other Ambulatory Visit: Payer: Self-pay | Admitting: *Deleted

## 2011-01-11 MED ORDER — METOPROLOL SUCCINATE ER 50 MG PO TB24: 50.0000 mg | ORAL_TABLET | Freq: Every day | ORAL | Status: DC

## 2011-01-12 ENCOUNTER — Other Ambulatory Visit: Payer: Self-pay | Admitting: *Deleted

## 2011-01-12 NOTE — Telephone Encounter (Signed)
I sent this but forgot to put the pharmacy in, we will need to call and verify pharmacy.

## 2011-01-13 NOTE — Telephone Encounter (Signed)
Left message to let us know what pharmacy he uses

## 2011-01-19 ENCOUNTER — Other Ambulatory Visit: Payer: Self-pay | Admitting: *Deleted

## 2011-01-19 MED ORDER — METOPROLOL SUCCINATE ER 50 MG PO TB24: 50.0000 mg | ORAL_TABLET | Freq: Every day | ORAL | Status: DC

## 2011-02-07 ENCOUNTER — Encounter: Payer: Self-pay | Admitting: Cardiovascular Disease

## 2011-02-14 ENCOUNTER — Encounter: Payer: Self-pay | Admitting: Cardiovascular Disease

## 2011-02-14 ENCOUNTER — Ambulatory Visit (INDEPENDENT_AMBULATORY_CARE_PROVIDER_SITE_OTHER): Payer: Medicare Other | Admitting: Cardiovascular Disease

## 2011-02-14 DIAGNOSIS — R079 Chest pain, unspecified: Secondary | ICD-10-CM

## 2011-02-14 DIAGNOSIS — R42 Dizziness and giddiness: Secondary | ICD-10-CM

## 2011-02-14 DIAGNOSIS — E785 Hyperlipidemia, unspecified: Secondary | ICD-10-CM

## 2011-02-14 DIAGNOSIS — I10 Essential (primary) hypertension: Secondary | ICD-10-CM

## 2011-02-14 DIAGNOSIS — I2510 Atherosclerotic heart disease of native coronary artery without angina pectoris: Secondary | ICD-10-CM

## 2011-02-14 NOTE — Assessment & Plan Note (Signed)
Mildly elevated today. He will monitor at home.

## 2011-02-14 NOTE — Progress Notes (Addendum)
Patient ID: [name], male    DOB: 06-16-1941, 70 y.o.   MRN: [id number]  HPI Comments: [name] is a very pleasant 70 year old male with a history of coronary artery disease, status post previous stenting of the LAD and right coronary artery with drug-eluting stents.  Most recent catheterization was in April 2005, which showed moderate diffuse nonobstructive disease.  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 EF 54%.  He also has a history of hypertension and hyperlipidemia as well as kidney stones. He presents for routine follow up.  Overall he reports that he has been doing well. No significant chest pain or shortness of breath. He continues to walk 4 times a week. 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 No new complaints. Previous symptoms of dizziness has resolved.   EKG shows normal sinus rhythm with rate of 57 beats per minute, first degree AV block, no other significant ST or T wave changes   Old labs March 2010 showed total cholesterol 131, LDL 85, HDL 38      Outpatient Encounter Prescriptions as of 02/14/2011  Medication Sig Dispense Refill  . aspirin 81 MG tablet Take 81 mg by mouth daily.        . clopidogrel (PLAVIX) 75 MG tablet Take 75 mg by mouth daily.        . metoprolol (TOPROL-XL) 50 MG 24 hr tablet Take 1 tablet (50 mg total) by mouth daily.  30 tablet  8  . Multiple Vitamins-Minerals (CENTRUM PO) Take by mouth daily.        . simvastatin (ZOCOR) 40 MG tablet Take 40 mg by mouth at bedtime.          Review of Systems  Constitutional: Negative.   HENT: Negative.   Eyes: Negative.   Respiratory: Negative.   Cardiovascular: Negative.   Gastrointestinal: Negative.   Musculoskeletal: Negative.   Skin: Negative.   Neurological: Negative.   Hematological: Negative.   Psychiatric/Behavioral: Negative.   All other systems reviewed and are negative.    BP 151/85  Pulse 57  Ht 5\' 6"   (1.676 m)  Wt 220 lb (99.791 kg)  BMI 35.51 kg/m2   Physical Exam  Nursing note and vitals reviewed. Constitutional: He is oriented to person, place, and time. He appears well-developed and well-nourished.  HENT:  Head: Normocephalic.  Nose: Nose normal.  Mouth/Throat: Oropharynx is clear and moist.  Eyes: Conjunctivae are normal. Pupils are equal, round, and reactive to light.  Neck: Normal range of motion. Neck supple. No JVD present.  Cardiovascular: Normal rate, regular rhythm, S1 normal, S2 normal, normal heart sounds and intact distal pulses.  Exam reveals no gallop and no friction rub.   No murmur heard. Pulmonary/Chest: Effort normal and breath sounds normal. No respiratory distress. He has no wheezes. He has no rales. He exhibits no tenderness.  Abdominal: Soft. Bowel sounds are normal. He exhibits no distension. There is no tenderness.  Musculoskeletal: Normal range of motion. He exhibits no edema and no tenderness.  Lymphadenopathy:    He has no cervical adenopathy.  Neurological: He is alert and oriented to person, place, and time. Coordination normal.  Skin: Skin is warm and dry. No rash noted. No erythema.  Psychiatric: He has a normal mood and affect. His behavior is normal. Judgment and thought content normal.           Assessment and Plan

## 2011-02-14 NOTE — Assessment & Plan Note (Signed)
No further signs of dizziness. Symptoms seem to have resolved.

## 2011-02-14 NOTE — Assessment & Plan Note (Signed)
Currently with no symptoms of angina. No further workup at this time. Continue current medication regimen. 

## 2011-02-14 NOTE — Assessment & Plan Note (Signed)
We will try to obtain his most recent cholesterol panel from Dr. Judithann Sheen.

## 2011-02-14 NOTE — Assessment & Plan Note (Signed)
No significant chest pain on today's visit. No further workup has been ordered.

## 2011-02-14 NOTE — Patient Instructions (Signed)
You are doing well. No medication changes were made. Please call us if you have new issues that need to be addressed before your next appt.  We will call you for a follow up Appt. In 12 months  

## 2011-02-24 ENCOUNTER — Other Ambulatory Visit: Payer: Self-pay | Admitting: *Deleted

## 2011-02-24 MED ORDER — SIMVASTATIN 40 MG PO TABS: 40.0000 mg | ORAL_TABLET | Freq: Every day | ORAL | Status: DC

## 2011-02-24 MED ORDER — CLOPIDOGREL BISULFATE 75 MG PO TABS: 75.0000 mg | ORAL_TABLET | Freq: Every day | ORAL | Status: DC

## 2011-06-26 ENCOUNTER — Ambulatory Visit: Payer: Self-pay | Admitting: Urology

## 2011-10-09 ENCOUNTER — Encounter: Payer: Medicare Other | Admitting: Cardiovascular Disease

## 2011-10-09 NOTE — Progress Notes (Signed)
   Patient ID: [name], male    DOB: June 13, 1941, 71 y.o.   MRN: [id number]  HPI Comments: Mr. Ragsdale is a very pleasant 71 year old male with a history of coronary artery disease, status post previous stenting of the LAD and right coronary artery with drug-eluting stents.  Most recent catheterization was in April 2005, which showed moderate diffuse nonobstructive disease.  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 EF 54%.  He also has a history of hypertension and hyperlipidemia as well as kidney stones. He presents for routine follow up.  Overall he reports that he has been doing well. No significant chest pain or shortness of breath. He continues to walk 4 times a week. 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 No new complaints. Previous symptoms of dizziness has resolved.   EKG shows normal sinus rhythm with rate of 57 beats per minute, first degree AV block, no other significant ST or T wave changes   Old labs March 2010 showed total cholesterol 131, LDL 85, HDL 38      Outpatient Encounter Prescriptions as of 02/14/2011  Medication Sig Dispense Refill  . aspirin 81 MG tablet Take 81 mg by mouth daily.        . clopidogrel (PLAVIX) 75 MG tablet Take 75 mg by mouth daily.        . metoprolol (TOPROL-XL) 50 MG 24 hr tablet Take 1 tablet (50 mg total) by mouth daily.  30 tablet  8  . Multiple Vitamins-Minerals (CENTRUM PO) Take by mouth daily.        . simvastatin (ZOCOR) 40 MG tablet Take 40 mg by mouth at bedtime.          Review of Systems  Constitutional: Negative.   HENT: Negative.   Eyes: Negative.   Respiratory: Negative.   Cardiovascular: Negative.   Gastrointestinal: Negative.   Musculoskeletal: Negative.   Skin: Negative.   Neurological: Negative.   Hematological: Negative.   Psychiatric/Behavioral: Negative.   All other systems reviewed and are negative.    There were no vitals taken for this  visit.   Physical Exam  Nursing note and vitals reviewed. Constitutional: He is oriented to person, place, and time. He appears well-developed and well-nourished.  HENT:  Head: Normocephalic.  Nose: Nose normal.  Mouth/Throat: Oropharynx is clear and moist.  Eyes: Conjunctivae are normal. Pupils are equal, round, and reactive to light.  Neck: Normal range of motion. Neck supple. No JVD present.  Cardiovascular: Normal rate, regular rhythm, S1 normal, S2 normal, normal heart sounds and intact distal pulses.  Exam reveals no gallop and no friction rub.   No murmur heard. Pulmonary/Chest: Effort normal and breath sounds normal. No respiratory distress. He has no wheezes. He has no rales. He exhibits no tenderness.  Abdominal: Soft. Bowel sounds are normal. He exhibits no distension. There is no tenderness.  Musculoskeletal: Normal range of motion. He exhibits no edema and no tenderness.  Lymphadenopathy:    He has no cervical adenopathy.  Neurological: He is alert and oriented to person, place, and time. Coordination normal.  Skin: Skin is warm and dry. No rash noted. No erythema.  Psychiatric: He has a normal mood and affect. His behavior is normal. Judgment and thought content normal.           Assessment and Plan

## 2011-10-20 ENCOUNTER — Ambulatory Visit: Payer: Medicare Other | Admitting: Cardiovascular Disease

## 2012-02-14 ENCOUNTER — Other Ambulatory Visit: Payer: Self-pay | Admitting: *Deleted

## 2012-02-14 MED ORDER — METOPROLOL SUCCINATE ER 50 MG PO TB24: 50.0000 mg | ORAL_TABLET | Freq: Every day | ORAL | Status: DC

## 2012-02-14 NOTE — Telephone Encounter (Signed)
Refilled Metoprolol

## 2012-02-20 ENCOUNTER — Ambulatory Visit (INDEPENDENT_AMBULATORY_CARE_PROVIDER_SITE_OTHER): Payer: Medicare Other | Admitting: Cardiovascular Disease

## 2012-02-20 ENCOUNTER — Encounter: Payer: Self-pay | Admitting: Cardiovascular Disease

## 2012-02-20 VITALS — BP 132/80 | HR 74 | Ht 72.0 in | Wt 216.0 lb

## 2012-02-20 DIAGNOSIS — I2510 Atherosclerotic heart disease of native coronary artery without angina pectoris: Secondary | ICD-10-CM

## 2012-02-20 DIAGNOSIS — R42 Dizziness and giddiness: Secondary | ICD-10-CM

## 2012-02-20 DIAGNOSIS — I10 Essential (primary) hypertension: Secondary | ICD-10-CM

## 2012-02-20 DIAGNOSIS — E785 Hyperlipidemia, unspecified: Secondary | ICD-10-CM

## 2012-02-20 NOTE — Patient Instructions (Addendum)
Hold the metoprolol for a short trial period to see if dizziness resolves Monitor your blood pressure Call the office for high blood pressure (greater than 140 on regular basis)  Please call us if you have new issues that need to be addressed before your next appt.  Your physician wants you to follow-up in: 12 months.  You will receive a reminder letter in the mail two months in advance. If you don't receive a letter, please call our office to schedule the follow-up appointment.

## 2012-02-20 NOTE — Assessment & Plan Note (Signed)
We have suggested he stay on his statin. Cholesterol at goal, LDL less than 70

## 2012-02-20 NOTE — Assessment & Plan Note (Signed)
He will hold his metoprolol for his dizziness, track his blood pressure. We could start lisinopril or other medication for blood pressure after he does a trial off the metoprolol.

## 2012-02-20 NOTE — Progress Notes (Signed)
Patient ID: [name], male    DOB: 12-Dec-1940, 71 y.o.   MRN: [id number]  HPI Comments: Mr. Hellard is a very pleasant 71 year old male with a history of coronary artery disease, status post previous stenting of the LAD and right coronary artery with drug-eluting stents.  Most recent catheterization was in April 2005, which showed moderate diffuse nonobstructive disease.  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 EF 54%.  He also has a history of hypertension and hyperlipidemia as well as kidney stones. He presents for routine follow up.   He reports that he has had dizziness for 10 years, worse in the past year. He reports 3 months ago he bent over and he passed out in the street. Symptoms of dizziness are positional such as when he gets out of bed too quickly. On his last clinic visit, notes indicate his symptoms had resolved. He did have a heart rate of 57 on his last visit. He reports blood pressure at home is in the 130 range systolic, heart rates in the high 50s. He exercises frequently during the week. He has lost 4 pounds from his last clinic visit. Overall he feels well and is working full-time job.  Blood pressures lying down 123/78, heart rate 69, Blood pressure sitting 142/90 with heart rate 67 Blood pressure standing 151/83 heart rate 73    EKG shows normal sinus rhythm with rate of 67 beats per minute, first degree AV block, no other significant ST or T wave changes    Outpatient Encounter Prescriptions as of 02/20/2012  Medication Sig Dispense Refill  . aspirin 81 MG tablet Take 81 mg by mouth daily.        . clopidogrel (PLAVIX) 75 MG tablet Take 1 tablet (75 mg total) by mouth daily.  30 tablet  12  . Fexofenadine HCl (ALLEGRA PO) Take by mouth as needed.      . metoprolol succinate (TOPROL-XL) 50 MG 24 hr tablet Take 1 tablet (50 mg total) by mouth daily.  30 tablet  8  . Multiple Vitamins-Minerals (CENTRUM PO) Take by  mouth daily.        . Omega-3 Fatty Acids (FISH OIL) 1000 MG CAPS Take 1,000 mg by mouth 2 (two) times daily.      . simvastatin (ZOCOR) 40 MG tablet Take 1 tablet (40 mg total) by mouth at bedtime.  30 tablet  12     Review of Systems  Constitutional: Negative.   HENT: Negative.   Eyes: Negative.   Respiratory: Negative.   Cardiovascular: Negative.   Gastrointestinal: Negative.   Musculoskeletal: Negative.   Skin: Negative.   Neurological: Positive for dizziness.  Hematological: Negative.   Psychiatric/Behavioral: Negative.   All other systems reviewed and are negative.    BP 132/80  Pulse 74  Ht 6' (1.829 m)  Wt 216 lb (97.977 kg)  BMI 29.29 kg/m2  Physical Exam  Nursing note and vitals reviewed. Constitutional: He is oriented to person, place, and time. He appears well-developed and well-nourished.  HENT:  Head: Normocephalic.  Nose: Nose normal.  Mouth/Throat: Oropharynx is clear and moist.  Eyes: Conjunctivae are normal. Pupils are equal, round, and reactive to light.  Neck: Normal range of motion. Neck supple. No JVD present.  Cardiovascular: Normal rate, regular rhythm, S1 normal, S2 normal, normal heart sounds and intact distal pulses.  Exam reveals no gallop and no friction rub.   No murmur heard.  Pulmonary/Chest: Effort normal and breath sounds normal. No respiratory distress. He has no wheezes. He has no rales. He exhibits no tenderness.  Abdominal: Soft. Bowel sounds are normal. He exhibits no distension. There is no tenderness.  Musculoskeletal: Normal range of motion. He exhibits no edema and no tenderness.  Lymphadenopathy:    He has no cervical adenopathy.  Neurological: He is alert and oriented to person, place, and time. Coordination normal.  Skin: Skin is warm and dry. No rash noted. No erythema.  Psychiatric: He has a normal mood and affect. His behavior is normal. Judgment and thought content normal.           Assessment and Plan

## 2012-02-20 NOTE — Assessment & Plan Note (Signed)
He is more symptomatic from his dizziness on this visit. We have suggested he weaned back on his metoprolol to see if this helps his symptoms. If this does help his dizziness, he would likely need additional blood pressure medication such as lisinopril. He will closely track his blood pressure and call us with his numbers.

## 2012-02-20 NOTE — Assessment & Plan Note (Signed)
Currently with no symptoms of angina. No further workup at this time. Continue current medication regimen. 

## 2012-03-28 ENCOUNTER — Other Ambulatory Visit: Payer: Self-pay | Admitting: *Deleted

## 2012-03-28 MED ORDER — METOPROLOL SUCCINATE ER 50 MG PO TB24: 50.0000 mg | ORAL_TABLET | Freq: Every day | ORAL | Status: DC

## 2012-03-28 MED ORDER — CLOPIDOGREL BISULFATE 75 MG PO TABS: 75.0000 mg | ORAL_TABLET | Freq: Every day | ORAL | Status: DC

## 2012-03-28 NOTE — Telephone Encounter (Signed)
Refilled Simvastatin and clopidogrel.

## 2012-05-08 ENCOUNTER — Telehealth: Payer: Self-pay | Admitting: Cardiovascular Disease

## 2012-05-08 NOTE — Telephone Encounter (Signed)
Is this ok?

## 2012-05-08 NOTE — Telephone Encounter (Signed)
Pt having colonoscopy on 11/20 and needs cardiac clearance. Needs to hold plavix and ASA for 5 days. [address] [place] [phone] fax [phone]

## 2012-05-12 NOTE — Telephone Encounter (Signed)
Always a risk holding both Would confirm that GI needs both held. No recent stent placement Should be ok

## 2012-05-13 NOTE — Telephone Encounter (Signed)
Letter faxed to # provided 

## 2012-05-29 ENCOUNTER — Ambulatory Visit: Payer: Self-pay | Admitting: Gastroenterology

## 2012-05-31 ENCOUNTER — Other Ambulatory Visit: Payer: Self-pay | Admitting: *Deleted

## 2012-05-31 MED ORDER — SIMVASTATIN 40 MG PO TABS: 40.0000 mg | ORAL_TABLET | Freq: Every day | ORAL | Status: DC

## 2012-05-31 NOTE — Telephone Encounter (Signed)
Refilled Simvastatin. 

## 2013-04-02 ENCOUNTER — Encounter: Payer: Self-pay | Admitting: Cardiovascular Disease

## 2013-04-02 ENCOUNTER — Ambulatory Visit (INDEPENDENT_AMBULATORY_CARE_PROVIDER_SITE_OTHER): Payer: Medicare Other | Admitting: Cardiovascular Disease

## 2013-04-02 VITALS — BP 140/98 | HR 61 | Ht 72.0 in | Wt 216.5 lb

## 2013-04-02 DIAGNOSIS — I10 Essential (primary) hypertension: Secondary | ICD-10-CM

## 2013-04-02 DIAGNOSIS — E785 Hyperlipidemia, unspecified: Secondary | ICD-10-CM

## 2013-04-02 DIAGNOSIS — I2510 Atherosclerotic heart disease of native coronary artery without angina pectoris: Secondary | ICD-10-CM

## 2013-04-02 DIAGNOSIS — R42 Dizziness and giddiness: Secondary | ICD-10-CM

## 2013-04-02 MED ORDER — METOPROLOL SUCCINATE ER 50 MG PO TB24: 50.0000 mg | ORAL_TABLET | Freq: Every day | ORAL | Status: DC

## 2013-04-02 MED ORDER — SIMVASTATIN 40 MG PO TABS: 40.0000 mg | ORAL_TABLET | Freq: Every day | ORAL | Status: DC

## 2013-04-02 MED ORDER — CLOPIDOGREL BISULFATE 75 MG PO TABS: 75.0000 mg | ORAL_TABLET | Freq: Every day | ORAL | Status: DC

## 2013-04-02 NOTE — Assessment & Plan Note (Signed)
Currently with no symptoms of angina. No further workup at this time. Continue current medication regimen. 

## 2013-04-02 NOTE — Assessment & Plan Note (Signed)
Cholesterol is at goal on the current lipid regimen. No changes to the medications were made.  

## 2013-04-02 NOTE — Progress Notes (Signed)
Patient ID: [name], male    DOB: February 06, 1941, 71 y.o.   MRN: [id number]  HPI Comments: [name] is a very pleasant 72 year old male with a history of coronary artery disease, status post previous stenting of the LAD and right coronary artery with drug-eluting stents.  Most recent catheterization was in April 2005, which showed moderate diffuse nonobstructive disease.  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 EF 54%.  He also has a history of hypertension and hyperlipidemia as well as kidney stones. He presents for routine follow up.   Overall he reports that he is doing well. He continues to have his chronic dizziness which she attributes to in her ear problem. Symptoms worse if he looks up and turns his head. He is doing vigorous exercise weekly, walks approximately 20 miles per week. Blood pressure at home typically 120 over 60s. No symptoms of chest pain or shortness of breath. Some dizziness if he gets out of bed too quickly. Overall he feels well and is working full-time job.   EKG shows normal sinus rhythm with rate of 61 beats per minute, first degree AV block, no other significant ST or T wave changes  Lab work August 2014 showing total cholesterol 127, LDL 72, HDL 35    Outpatient Encounter Prescriptions as of 04/02/2013  Medication Sig Dispense Refill  . aspirin 81 MG tablet Take 81 mg by mouth daily.        . clopidogrel (PLAVIX) 75 MG tablet Take 1 tablet (75 mg total) by mouth daily.  30 tablet  8  . Fexofenadine HCl (ALLEGRA PO) Take by mouth as needed.      . metoprolol succinate (TOPROL-XL) 50 MG 24 hr tablet Take 1 tablet (50 mg total) by mouth daily.  30 tablet  8  . Multiple Vitamins-Minerals (CENTRUM PO) Take by mouth daily.        . Omega-3 Fatty Acids (FISH OIL) 1000 MG CAPS Take 1,000 mg by mouth 2 (two) times daily.      . simvastatin (ZOCOR) 40 MG tablet Take 1 tablet (40 mg total) by mouth at bedtime.  30  tablet  6   No facility-administered encounter medications on file as of 04/02/2013.     Review of Systems  Constitutional: Negative.   HENT: Negative.   Eyes: Negative.   Respiratory: Negative.   Cardiovascular: Negative.   Gastrointestinal: Negative.   Endocrine: Negative.   Musculoskeletal: Negative.   Skin: Negative.   Allergic/Immunologic: Negative.   Neurological: Positive for dizziness.  Hematological: Negative.   Psychiatric/Behavioral: Negative.   All other systems reviewed and are negative.    BP 140/98  Pulse 61  Ht 6' (1.829 m)  Wt 216 lb 8 oz (98.204 kg)  BMI 29.36 kg/m2  Physical Exam  Nursing note and vitals reviewed. Constitutional: He is oriented to person, place, and time. He appears well-developed and well-nourished.  HENT:  Head: Normocephalic.  Nose: Nose normal.  Mouth/Throat: Oropharynx is clear and moist.  Eyes: Conjunctivae are normal. Pupils are equal, round, and reactive to light.  Neck: Normal range of motion. Neck supple. No JVD present.  Cardiovascular: Normal rate, regular rhythm, S1 normal, S2 normal, normal heart sounds and intact distal pulses.  Exam reveals no gallop and no friction rub.   No murmur heard. Pulmonary/Chest: Effort normal and breath sounds normal. No respiratory distress. He has no wheezes. He has no rales. He exhibits no  tenderness.  Abdominal: Soft. Bowel sounds are normal. He exhibits no distension. There is no tenderness.  Musculoskeletal: Normal range of motion. He exhibits no edema and no tenderness.  Lymphadenopathy:    He has no cervical adenopathy.  Neurological: He is alert and oriented to person, place, and time. Coordination normal.  Skin: Skin is warm and dry. No rash noted. No erythema.  Psychiatric: He has a normal mood and affect. His behavior is normal. Judgment and thought content normal.      Assessment and Plan

## 2013-04-02 NOTE — Assessment & Plan Note (Signed)
Blood pressure is well controlled on today's visit. No changes made to the medications. 

## 2013-04-02 NOTE — Patient Instructions (Addendum)
You are doing well. No medication changes were made.  Please call us if you have new issues that need to be addressed before your next appt.  Your physician wants you to follow-up in: 12 months.  You will receive a reminder letter in the mail two months in advance. If you don't receive a letter, please call our office to schedule the follow-up appointment. 

## 2013-04-02 NOTE — Assessment & Plan Note (Addendum)
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

## 2013-06-25 ENCOUNTER — Ambulatory Visit: Payer: Self-pay | Admitting: Urology

## 2014-04-03 ENCOUNTER — Encounter: Payer: Self-pay | Admitting: Cardiovascular Disease

## 2014-04-03 ENCOUNTER — Ambulatory Visit (INDEPENDENT_AMBULATORY_CARE_PROVIDER_SITE_OTHER): Payer: Medicare Other | Admitting: Cardiovascular Disease

## 2014-04-03 VITALS — BP 165/93 | HR 56 | Ht 73.0 in | Wt 215.8 lb

## 2014-04-03 DIAGNOSIS — R42 Dizziness and giddiness: Secondary | ICD-10-CM

## 2014-04-03 DIAGNOSIS — R079 Chest pain, unspecified: Secondary | ICD-10-CM

## 2014-04-03 DIAGNOSIS — I2510 Atherosclerotic heart disease of native coronary artery without angina pectoris: Secondary | ICD-10-CM

## 2014-04-03 DIAGNOSIS — E785 Hyperlipidemia, unspecified: Secondary | ICD-10-CM

## 2014-04-03 DIAGNOSIS — I10 Essential (primary) hypertension: Secondary | ICD-10-CM

## 2014-04-03 MED ORDER — LISINOPRIL 20 MG PO TABS: 20.0000 mg | ORAL_TABLET | Freq: Every day | ORAL | Status: DC

## 2014-04-03 NOTE — Assessment & Plan Note (Signed)
Cholesterol is at goal on the current lipid regimen. No changes to the medications were made.  

## 2014-04-03 NOTE — Patient Instructions (Addendum)
You are doing well. Please start lisinopril daily as needed for blood pressure  Please call us if you have new issues that need to be addressed before your next appt.  Your physician wants you to follow-up in: 12 months.  You will receive a reminder letter in the mail two months in advance. If you don't receive a letter, please call our office to schedule the follow-up appointment.

## 2014-04-03 NOTE — Assessment & Plan Note (Addendum)
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

## 2014-04-03 NOTE — Progress Notes (Signed)
Patient ID: [name], male    DOB: 20-Jun-1941, 73 y.o.   MRN: [id number]  HPI Comments: Mr. Mcfadyen is a very pleasant 73 year old male with a history of coronary artery disease, status post previous stenting of the LAD and right coronary artery with drug-eluting stents.   catheterization in April 2005, which showed moderate diffuse nonobstructive disease.  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 EF 54%.  He also has a history of hypertension and hyperlipidemia as well as kidney stones. He presents for routine follow up.   Overall he reports that he is doing well. He reports that he is walking 20 miles per week. Blood pressure at home is sometimes running 150 systolic. Recently has been running higher. Prior to this was running 120s. He reports he has more stress recently. Previously was on lisinopril, took himself off when blood pressure got better. Weight is about the same, possibly down one or 2 pounds from prior clinic visit.  Lab work 01/29/2014 showing total cholesterol 133, LDL 79, HDL 39 Previously had problems with chronic dizziness which he attributes to in her ear problem.  he is working full-time job. No recent chest pain or shortness of breath with exertion   EKG shows normal sinus rhythm with rate of 56 beats per minute, first degree AV block, no other significant ST or T wave changes   Outpatient Encounter Prescriptions as of 04/03/2014  Medication Sig  . aspirin 81 MG tablet Take 81 mg by mouth daily.    . clopidogrel (PLAVIX) 75 MG tablet Take 1 tablet (75 mg total) by mouth daily.  Marland Kitchen Fexofenadine HCl (ALLEGRA PO) Take by mouth as needed.  . metoprolol succinate (TOPROL-XL) 50 MG 24 hr tablet Take 1 tablet (50 mg total) by mouth daily.  . Multiple Vitamins-Minerals (CENTRUM PO) Take by mouth daily.    . Omega-3 Fatty Acids (FISH OIL) 1000 MG CAPS Take 1,000 mg by mouth 2 (two) times daily.  . simvastatin (ZOCOR) 40  MG tablet Take 1 tablet (40 mg total) by mouth at bedtime.    Review of Systems  Constitutional: Negative.   HENT: Negative.   Eyes: Negative.   Respiratory: Negative.   Cardiovascular: Negative.   Gastrointestinal: Negative.   Endocrine: Negative.   Musculoskeletal: Negative.   Skin: Negative.   Allergic/Immunologic: Negative.   Neurological: Negative.   Hematological: Negative.   Psychiatric/Behavioral: Negative.   All other systems reviewed and are negative.   BP 165/93  Pulse 56  Ht  (1.854 m)  Wt 215 lb 12 oz (97.864 kg)  BMI 28.47 kg/m2  Physical Exam  Nursing note and vitals reviewed. Constitutional: He is oriented to person, place, and time. He appears well-developed and well-nourished.  HENT:  Head: Normocephalic.  Nose: Nose normal.  Mouth/Throat: Oropharynx is clear and moist.  Eyes: Conjunctivae are normal. Pupils are equal, round, and reactive to light.  Neck: Normal range of motion. Neck supple. No JVD present.  Cardiovascular: Normal rate, regular rhythm, S1 normal, S2 normal, normal heart sounds and intact distal pulses.  Exam reveals no gallop and no friction rub.   No murmur heard. Pulmonary/Chest: Effort normal and breath sounds normal. No respiratory distress. He has no wheezes. He has no rales. He exhibits no tenderness.  Abdominal: Soft. Bowel sounds are normal. He exhibits no distension. There is no tenderness.  Musculoskeletal: Normal range of motion. He exhibits no edema and no tenderness.  Lymphadenopathy:    He has no cervical adenopathy.  Neurological: He is alert and oriented to person, place, and time. Coordination normal.  Skin: Skin is warm and dry. No rash noted. No erythema.  Psychiatric: He has a normal mood and affect. His behavior is normal. Judgment and thought content normal.      Assessment and Plan

## 2014-04-03 NOTE — Assessment & Plan Note (Signed)
Currently with no symptoms of angina. No further workup at this time. Continue current medication regimen. 

## 2014-04-03 NOTE — Assessment & Plan Note (Signed)
No recent episodes of chest pain or symptoms concerning for angina

## 2014-04-03 NOTE — Assessment & Plan Note (Signed)
Blood pressure is elevated today. We will restart lisinopril 20 mg daily

## 2014-04-08 ENCOUNTER — Other Ambulatory Visit: Payer: Self-pay | Admitting: *Deleted

## 2014-04-08 MED ORDER — SIMVASTATIN 40 MG PO TABS: 40.0000 mg | ORAL_TABLET | Freq: Every day | ORAL | Status: DC

## 2014-04-08 MED ORDER — CLOPIDOGREL BISULFATE 75 MG PO TABS: 75.0000 mg | ORAL_TABLET | Freq: Every day | ORAL | Status: DC

## 2014-04-08 MED ORDER — METOPROLOL SUCCINATE ER 50 MG PO TB24: 50.0000 mg | ORAL_TABLET | Freq: Every day | ORAL | Status: DC

## 2014-04-13 ENCOUNTER — Inpatient Hospital Stay: Payer: Self-pay | Admitting: Internal Medicine

## 2014-04-13 LAB — URINALYSIS, COMPLETE
BACTERIA: NONE SEEN
BILIRUBIN, UR: NEGATIVE
Cellular Cast: 17
Glucose,UR: 50 mg/dL (ref 0–75)
Granular Cast: 10
KETONE: NEGATIVE
LEUKOCYTE ESTERASE: NEGATIVE
NITRITE: NEGATIVE
PH: 5 (ref 4.5–8.0)
RBC,UR: 11 /HPF (ref 0–5)
Specific Gravity: 1.02 (ref 1.003–1.030)
Squamous Epithelial: NONE SEEN

## 2014-04-13 LAB — BASIC METABOLIC PANEL
Anion Gap: 10 (ref 7–16)
BUN: 48 mg/dL — ABNORMAL HIGH (ref 7–18)
CALCIUM: 8.8 mg/dL (ref 8.5–10.1)
Chloride: 109 mmol/L — ABNORMAL HIGH (ref 98–107)
Co2: 19 mmol/L — ABNORMAL LOW (ref 21–32)
Creatinine: 4.46 mg/dL — ABNORMAL HIGH (ref 0.60–1.30)
EGFR (African American): 17 — ABNORMAL LOW
EGFR (Non-African Amer.): 14 — ABNORMAL LOW
Glucose: 140 mg/dL — ABNORMAL HIGH (ref 65–99)
Osmolality: 291 (ref 275–301)
POTASSIUM: 4 mmol/L (ref 3.5–5.1)
Sodium: 138 mmol/L (ref 136–145)

## 2014-04-13 LAB — CBC WITH DIFFERENTIAL/PLATELET
BASOS ABS: 0 10*3/uL (ref 0.0–0.1)
BASOS PCT: 0.3 %
Eosinophil #: 0.1 10*3/uL (ref 0.0–0.7)
Eosinophil %: 1.4 %
HCT: 50.8 % (ref 40.0–52.0)
HGB: 16.9 g/dL (ref 13.0–18.0)
LYMPHS ABS: 0.6 10*3/uL — AB (ref 1.0–3.6)
Lymphocyte %: 12.2 %
MCH: 31 pg (ref 26.0–34.0)
MCHC: 33.1 g/dL (ref 32.0–36.0)
MCV: 94 fL (ref 80–100)
MONOS PCT: 13.2 %
Monocyte #: 0.7 x10 3/mm (ref 0.2–1.0)
NEUTROS ABS: 3.6 10*3/uL (ref 1.4–6.5)
NEUTROS PCT: 72.9 %
PLATELETS: 182 10*3/uL (ref 150–440)
RBC: 5.44 10*6/uL (ref 4.40–5.90)
RDW: 13.4 % (ref 11.5–14.5)
WBC: 4.9 10*3/uL (ref 3.8–10.6)

## 2014-04-13 LAB — TROPONIN I: Troponin-I: <0.02 ng/mL

## 2014-04-13 LAB — CK: CK, TOTAL: 105 U/L

## 2014-04-14 LAB — CBC WITH DIFFERENTIAL/PLATELET
BASOS ABS: 0 10*3/uL (ref 0.0–0.1)
BASOS PCT: 0.4 %
Eosinophil #: 0 10*3/uL (ref 0.0–0.7)
Eosinophil %: 1 %
HCT: 48.1 % (ref 40.0–52.0)
HGB: 15.7 g/dL (ref 13.0–18.0)
LYMPHS ABS: 0.6 10*3/uL — AB (ref 1.0–3.6)
LYMPHS PCT: 14.2 %
MCH: 30.4 pg (ref 26.0–34.0)
MCHC: 32.5 g/dL (ref 32.0–36.0)
MCV: 93 fL (ref 80–100)
Monocyte #: 0.4 x10 3/mm (ref 0.2–1.0)
Monocyte %: 11.3 %
NEUTROS ABS: 2.9 10*3/uL (ref 1.4–6.5)
Neutrophil %: 73.1 %
Platelet: 162 10*3/uL (ref 150–440)
RBC: 5.16 10*6/uL (ref 4.40–5.90)
RDW: 13.1 % (ref 11.5–14.5)
WBC: 3.9 10*3/uL (ref 3.8–10.6)

## 2014-04-14 LAB — BASIC METABOLIC PANEL
Anion Gap: 11 (ref 7–16)
BUN: 46 mg/dL — ABNORMAL HIGH (ref 7–18)
CHLORIDE: 114 mmol/L — AB (ref 98–107)
CREATININE: 4.22 mg/dL — AB (ref 0.60–1.30)
Calcium, Total: 8.1 mg/dL — ABNORMAL LOW (ref 8.5–10.1)
Co2: 12 mmol/L — ABNORMAL LOW (ref 21–32)
EGFR (Non-African Amer.): 15 — ABNORMAL LOW
GFR CALC AF AMER: 18 — AB
Glucose: 113 mg/dL — ABNORMAL HIGH (ref 65–99)
OSMOLALITY: 287 (ref 275–301)
POTASSIUM: 3.5 mmol/L (ref 3.5–5.1)
Sodium: 137 mmol/L (ref 136–145)

## 2014-04-14 LAB — CLOSTRIDIUM DIFFICILE(ARMC)

## 2014-04-14 LAB — RAPID INFLUENZA A&B ANTIGENS (ARMC ONLY)

## 2014-04-15 LAB — COMPREHENSIVE METABOLIC PANEL
AST: 20 U/L (ref 15–37)
Albumin: 2.9 g/dL — ABNORMAL LOW (ref 3.4–5.0)
Alkaline Phosphatase: 55 U/L
Anion Gap: 4 — ABNORMAL LOW (ref 7–16)
BUN: 40 mg/dL — AB (ref 7–18)
Bilirubin,Total: 0.4 mg/dL (ref 0.2–1.0)
CALCIUM: 7.6 mg/dL — AB (ref 8.5–10.1)
Chloride: 116 mmol/L — ABNORMAL HIGH (ref 98–107)
Co2: 18 mmol/L — ABNORMAL LOW (ref 21–32)
Creatinine: 3.12 mg/dL — ABNORMAL HIGH (ref 0.60–1.30)
EGFR (African American): 25 — ABNORMAL LOW
EGFR (Non-African Amer.): 21 — ABNORMAL LOW
GLUCOSE: 106 mg/dL — AB (ref 65–99)
Osmolality: 286 (ref 275–301)
Potassium: 3.3 mmol/L — ABNORMAL LOW (ref 3.5–5.1)
SGPT (ALT): 16 U/L
Sodium: 138 mmol/L (ref 136–145)
Total Protein: 6.3 g/dL — ABNORMAL LOW (ref 6.4–8.2)

## 2014-04-15 LAB — CBC WITH DIFFERENTIAL/PLATELET
Basophil #: 0 10*3/uL (ref 0.0–0.1)
Basophil %: 0.5 %
Eosinophil #: 0 10*3/uL (ref 0.0–0.7)
Eosinophil %: 0.7 %
HCT: 43.5 % (ref 40.0–52.0)
HGB: 14.9 g/dL (ref 13.0–18.0)
Lymphocyte #: 0.8 10*3/uL — ABNORMAL LOW (ref 1.0–3.6)
Lymphocyte %: 19.9 %
MCH: 31 pg (ref 26.0–34.0)
MCHC: 34.1 g/dL (ref 32.0–36.0)
MCV: 91 fL (ref 80–100)
MONO ABS: 0.6 x10 3/mm (ref 0.2–1.0)
Monocyte %: 15.2 %
NEUTROS ABS: 2.7 10*3/uL (ref 1.4–6.5)
Neutrophil %: 63.7 %
Platelet: 173 10*3/uL (ref 150–440)
RBC: 4.78 10*6/uL (ref 4.40–5.90)
RDW: 13.7 % (ref 11.5–14.5)
WBC: 4.3 10*3/uL (ref 3.8–10.6)

## 2014-04-15 LAB — PHOSPHORUS: Phosphorus: 4.3 mg/dL (ref 2.5–4.9)

## 2014-04-16 LAB — CBC WITH DIFFERENTIAL/PLATELET
Basophil #: 0 10*3/uL (ref 0.0–0.1)
Basophil %: 0.6 %
EOS ABS: 0.1 10*3/uL (ref 0.0–0.7)
EOS PCT: 2.3 %
HCT: 38.5 % — AB (ref 40.0–52.0)
HGB: 13.2 g/dL (ref 13.0–18.0)
LYMPHS ABS: 1.1 10*3/uL (ref 1.0–3.6)
Lymphocyte %: 24.9 %
MCH: 31 pg (ref 26.0–34.0)
MCHC: 34.2 g/dL (ref 32.0–36.0)
MCV: 91 fL (ref 80–100)
MONO ABS: 0.7 x10 3/mm (ref 0.2–1.0)
Monocyte %: 16.1 %
Neutrophil #: 2.4 10*3/uL (ref 1.4–6.5)
Neutrophil %: 56.1 %
PLATELETS: 172 10*3/uL (ref 150–440)
RBC: 4.25 10*6/uL — ABNORMAL LOW (ref 4.40–5.90)
RDW: 13.2 % (ref 11.5–14.5)
WBC: 4.3 10*3/uL (ref 3.8–10.6)

## 2014-04-16 LAB — COMPREHENSIVE METABOLIC PANEL
ALBUMIN: 2.4 g/dL — AB (ref 3.4–5.0)
ALT: 15 U/L
Alkaline Phosphatase: 42 U/L — ABNORMAL LOW
Anion Gap: 11 (ref 7–16)
BUN: 29 mg/dL — ABNORMAL HIGH (ref 7–18)
Bilirubin,Total: 0.3 mg/dL (ref 0.2–1.0)
CALCIUM: 7.9 mg/dL — AB (ref 8.5–10.1)
CO2: 17 mmol/L — AB (ref 21–32)
Chloride: 116 mmol/L — ABNORMAL HIGH (ref 98–107)
Creatinine: 1.46 mg/dL — ABNORMAL HIGH (ref 0.60–1.30)
Glucose: 87 mg/dL (ref 65–99)
Osmolality: 292 (ref 275–301)
POTASSIUM: 3.8 mmol/L (ref 3.5–5.1)
SGOT(AST): 18 U/L (ref 15–37)
SODIUM: 144 mmol/L (ref 136–145)
Total Protein: 5.2 g/dL — ABNORMAL LOW (ref 6.4–8.2)

## 2014-04-16 LAB — STOOL CULTURE

## 2014-04-16 LAB — UR PROT ELECTROPHORESIS, URINE RANDOM

## 2014-04-16 LAB — WBCS, STOOL

## 2014-04-17 LAB — CBC WITH DIFFERENTIAL/PLATELET
Basophil #: 0 10*3/uL (ref 0.0–0.1)
Basophil %: 0.5 %
EOS PCT: 2.9 %
Eosinophil #: 0.1 10*3/uL (ref 0.0–0.7)
HCT: 36.6 % — AB (ref 40.0–52.0)
HGB: 12.6 g/dL — AB (ref 13.0–18.0)
LYMPHS ABS: 0.9 10*3/uL — AB (ref 1.0–3.6)
LYMPHS PCT: 19.5 %
MCH: 31 pg (ref 26.0–34.0)
MCHC: 34.5 g/dL (ref 32.0–36.0)
MCV: 90 fL (ref 80–100)
Monocyte #: 0.6 x10 3/mm (ref 0.2–1.0)
Monocyte %: 13.2 %
Neutrophil #: 2.9 10*3/uL (ref 1.4–6.5)
Neutrophil %: 63.9 %
Platelet: 181 10*3/uL (ref 150–440)
RBC: 4.08 10*6/uL — AB (ref 4.40–5.90)
RDW: 12.9 % (ref 11.5–14.5)
WBC: 4.6 10*3/uL (ref 3.8–10.6)

## 2014-04-17 LAB — BASIC METABOLIC PANEL
ANION GAP: 8 (ref 7–16)
BUN: 20 mg/dL — ABNORMAL HIGH (ref 7–18)
CALCIUM: 8 mg/dL — AB (ref 8.5–10.1)
CO2: 21 mmol/L (ref 21–32)
CREATININE: 1.1 mg/dL (ref 0.60–1.30)
Chloride: 117 mmol/L — ABNORMAL HIGH (ref 98–107)
EGFR (African American): >60
Glucose: 88 mg/dL (ref 65–99)
Osmolality: 293 (ref 275–301)
Potassium: 3.6 mmol/L (ref 3.5–5.1)
Sodium: 146 mmol/L — ABNORMAL HIGH (ref 136–145)

## 2014-04-17 LAB — PROTEIN ELECTROPHORESIS(ARMC)

## 2014-05-05 ENCOUNTER — Other Ambulatory Visit: Payer: Self-pay | Admitting: *Deleted

## 2014-05-05 MED ORDER — LISINOPRIL 20 MG PO TABS: 20.0000 mg | ORAL_TABLET | Freq: Every day | ORAL | Status: DC

## 2014-10-31 NOTE — Consult Note (Signed)
PATIENT NAME:  [name], [name] MR#:  [id number] DATE OF BIRTH:  [birth year]  DATE OF CONSULTATION:  04/14/2014  REFERRING PHYSICIAN:   CONSULTING PHYSICIAN:  Cala BradfordKimberly A. Arvilla MarketMills, ANP (Adult Nurse Practitioner)  REFERRING PHYSICIAN:  Aram BeechamJeffrey Sparks, MD.    CONSULTING PHYSICIAN: Lynnae Prudeobert Elliott, MD/Damond Borchers Arvilla MarketMills, ANP.    REASON FOR CONSULTATION: Diarrhea.   HISTORY OF PRESENT ILLNESS: This 74 year old patient was in his usual state of health until last Thursday. He developed acute episode of diarrhea with watery liquid stool 20 times per day. He had a fever, severe chills, and body aches. He thought he had the flu. He took Tylenol. His 75 year old granddaughter who he has been around had the same symptoms. The patient continued to have severe diarrhea for 4 days when he presented to his family doctor with profound weakness. He was found to have renal failure, 13 pound weight loss, and to be severely dehydrated.  Labs revealed a negative Clostridium difficile,  negative influenza study, negative Campylobacter stool infection. Ultrasound of the kidneys showed medical renal disease, stable renal cysts, no hydronephrosis. He has received IV fluids and consultation with nephrologist who suspects this is renal failure from prerenal azotemia from dehydration.  Today, he reports he still has 15 episodes of  diarrhea over 24 hours. He was up out of his sleep with diarrhea 5 times night. He has had green watery stool at least 1 cupful 3 times today. No bloody stools. He does not have cramps, fevers, or chills. He did have 1 episode of vomiting today and that was after lemon Jell-O. He hates the taste of Jell-O. He says he been tolerating other liquids just fine. He says he is really not feeling much better since admission despite the IV fluids.   LABORATORY STUDIES: The patient did have a colonoscopy in 2013 that showed diverticulosis. He has had a history of polyps. The patient reports he has never had diarrhea  like this in the past.   PAST MEDICAL HISTORY:  1. Hypertension.  2. DJD.   3. Kidney stone.  4. BPH.   5. CAD with 3 stents.   PAST SURGICAL HISTORY:  1.  Left eye surgery for detached retina repair.  2.  Three back surgeries.  3.  Cardiac catheterization with 3 stents placed.  4.  Colonoscopies, most recently 2013.   MEDICATIONS AT HOME:   1.  Metoprolol succinate 25 mg daily.  2.  Plavix 75 mg daily.  3.  Zocor 10 mg at bedtime.   ALLERGIES: CODEINE.   HABITS: Negative tobacco or alcohol.   REVIEW OF SYSTEMS: The patient denies recent antibiotic therapy. GI history as noted. He denies chest pain or pressure, heaviness, tightness, or cough. He still feels weak. He has fatigue. No dizziness, no headache. Diarrhea persists as noted. He is no longer is no longer having the body ache or the rigor that he had initially.   LABORATORY DATA: BUN is 46, creatinine 4.46, today it is down to 4.22. Baseline creatinine is 1.1 dated 01/29/2014. His potassium is 3.5 today. CBC is unremarkable including WBC 3.9, hemoglobin 15.7. Clostridium difficile  negative, stool for Campylobacter negative, influenza is negative. Urinalysis was turbid urine, positive for WBC 141 per high-power field, positive for mucus, granular cast, cellular casts, and no bacteria were seen.   LABORATORY TESTS PENDING: Hepatitis viral panel, protein electrophoresis serum, ANCA, stool for comprehensive culture, urine protein electrophoresis.    PHYSICAL EXAMINATION:  VITAL SIGNS: 98, 74, 17, 109/70. Pulse oximetry on  room air is 96%.  GENERAL: Elderly Caucasian male resting in bed talking with his daughter. He is very pleasant. No distress.  HEENT: Head is normocephalic. Conjunctivae are pink. Sclerae anicteric. Oral mucosa is somewhat dry and intact.  NECK: Supple. Trachea is midline.  CARDIAC: S1, S2 without murmur or gallop.  LUNGS: CTA. Respirations are nonlabored.  ABDOMEN: Soft. Bowel sounds are present and normal.  Nontender in all quadrants.  RECTAL: Deferred.  SKIN: Warm and dry without rash or edema  NEUROLOGIC: Cranial nerves grossly intact. Muscle strength is intact. The patient is moving about in bed. Gait not evaluated.  PSYCHIATRIC: Affect and mood within normal.  MUSCULOSKELETAL:  No joint effusion or tenderness noted.   IMPRESSION: The patient presents with acute renal failure thought secondary to significant diarrhea. His diarrhea was accompanied by rigor, body aches, and flulike illness similar to what  his granddaughter experienced. It took her a full week to get over the symptoms. The patient presents to the hospital with bowel movements 20 times per day since last Thursday. He reports about 15 bowel movements while here at the hospital. He is receiving IV fluids and has had nephrology consultation. The patient denies any specific change in medication, antibiotic therapy, or exposure to bad food. He does eat out at restaurants a lot, but he has noted no bad food problems. He has no significant abdominal pain or tenderness. Stool studies so far have been negative for Clostridium difficile, Campylobacter, and negative for influenza. Stool for comprehensive culture is still outstanding.   PLAN: Etiology for such severe diarrhea to include bacterial diarrhea that has not yet been identified. The association with the flulike illness really supports a viral or bacterial gastroenteritis. This is most likely a bacterial diarrheal event.   PLAN:   1.  Recommend stool for WBC. Hold on giving Imodium until we are certain that the WBC returns negative. I discussed this case with Dr. Mechele Collin in collaboration of care. He is in favor of beginning this patient on Cipro 500 mg 1 pill today. We will ask pharmacy to renal dose this medication.  2.  He should continue Zofran as needed for nausea and vomiting. He denies nausea. He says he vomited because of Jell-O. I also would recommend considering  a proton pump  inhibitor. He is on Plavix. If he shows no improvement in the next 24-48 hours then Dr. Mechele Collin will consider doing a flexible sigmoidoscopy for luminal evaluation.   Thank you for the consultation. These services provided by Cala Bradford A. Arvilla Market, MS, APRN, BC, ANP under collaborative agreement with Lynnae Prude, MD.      ____________________________ Ranae Plumber Arvilla Market, ANP (Adult Nurse Practitioner) kam:bu D: 04/14/2014 16:43:44 ET T: 04/14/2014 17:01:10 ET JOB#: [id number]  cc: Cala Bradford A. Arvilla Market, ANP (Adult Nurse Practitioner), <Dictator> Ranae Plumber Suzette Battiest, MSN, ANP-BC Adult Nurse Practitioner ELECTRONICALLY SIGNED 04/14/2014 17:54

## 2014-10-31 NOTE — Consult Note (Signed)
Pt stool cult came back neg.  Abd not tender today.  Will give antidiarrheal now.  Some bacterial pathogens do not show up on stool cultures and would continue cipro for now but looking more like viral, expect improvement in 48 hours.  Electronic Signatures: Scot JunElliott, Merrillyn Ackerley T (MD)  (Signed on 07-Oct-15 17:37)  Authored  Last Updated: 07-Oct-15 17:37 by Scot JunElliott, Saul Dorsi T (MD)

## 2014-10-31 NOTE — H&P (Signed)
PATIENT NAME:  [name], [name] MR#:  [id number] DATE OF BIRTH:  [birth year]  PRIMARY CARE PHYSICIAN:  Duane Lope. Judithann Sheen, MD  REQUESTING PHYSICIAN: Gladstone Pih, MD  CHIEF COMPLAINT: Feeling achy and dehydrated with diarrhea.  HISTORY OF PRESENT ILLNESS: The patient is a 74 year old male with a known history of hypertension, BPH, with history of kidney stone, who is being admitted for acute renal failure. The patient has been having diarrhea for the last 4 days. The patient's dog has been sick for about a week which he claims caused him to start having diarrhea for the last 4 days, watery in nature, about 10-15 times a day. He is also feeling achy all over the body and went to see his primary care physician, Dr. Judithann Sheen, who ordered some labs and was found to have acute renal failure and was requested to come to the Emergency Department. While in the ED, his repeat lab check showed creatinine of 4.46, and he is being admitted for further evaluation and  management.  PAST MEDICAL HISTORY:  1.  Hypertension.  2.  DJD.  3.  History of kidney stone.  4.  BPH.  5.  Erectile dysfunction.  6.  Obesity.  FAMILY HISTORY:  1.  Positive for cancer in the mother (ovarian cancer).  2.  Father had heart disease.   SOCIAL HISTORY: No alcohol. No tobacco abuse.   ALLERGIES: CODEINE.    MEDICATIONS AT HOME: 1.  Metoprolol succinate 25 mg p.o. daily.  2.  Plavix 75 mg p.o. daily.  3.  Zocor 10 mg p.o. at bedtime.   REVIEW OF SYSTEMS:   CONSTITUTIONAL: No fever. Positive for fatigue and weakness.  EYES: No blurred or double vision.  EARS, NOSE AND THROAT: Decreased hearing in one ear for the last couple days. Also some hoarseness of voice. No ear pain.  RESPIRATORY: No cough, wheezing, hemoptysis.  CARDIOVASCULAR: No chest pain, orthopnea, edema.  GASTROINTESTINAL: Positive for diarrhea. No nausea, vomiting, no abdominal pain.  GENITOURINARY: No dysuria or hematuria, decrease urinary stream.   ENDOCRINE: No polyuria or nocturia.   HEMATOLOGIC: No anemia or easy bruising.  SKIN: No rash or lesion.  MUSCULOSKELETAL: Achy all over the body.  NEUROLOGIC: No tingling, numbness, positive for generalized weakness.  PSYCHIATRIC: No history of anxiety or depression.   PHYSICAL EXAMINATION: VITAL SIGNS: Temperature is 98, heart rate 95 per minute, respirations 18, blood pressure 132/79. He is saturating 96% on room air.  GENERAL: The patient is a 74 year old male lying in the bed comfortably without any acute distress.  HEENT: Head atraumatic, normocephalic. Pupils equal, round, react to light and accommodation. No scleral icterus. Extraocular muscles intact. Oropharynx and nasopharynx dry and clear.  NECK:  Supple, no jugular venous distention. Showed no thyroid enlargement or tenderness.  LUNGS: Clear to auscultation bilaterally. No wheezing, rales, rhonchi, or crepitation.  CARDIOVASCULAR: S1, S2 is normal. No murmurs, rubs, or gallop.  ABDOMEN: Soft, nontender, nondistended. Bowel sounds present. No organomegaly or mass.  EXTREMITIES: No pedal edema, cyanosis, clubbing.  NEUROLOGIC: Cranial nerves III through XII intact. Muscle strength 5/5 in extremities. Sensation intact.  PSYCHIATRIC: The patient is alert and oriented x 3.  SKIN: No obvious rash, lesion. It is dry.  MUSCULOSKELETAL: No joint effusion or tenderness.   LABORATORY PANEL: Normal CBC. Negative urinalysis. Normal first set of cardiac enzymes. BMP within normal limits, except BUN of 48, creatinine 4.46. Blood sugar of 140.   IMPRESSION AND PLAN: 1.  Acute renal failure, likely prerenal  in nature from ongoing diarrhea. Would avoid any nephrotoxins, start him on aggressive intravenous hydration.  Obtain renal ultrasound.  Consider Foley if he is not able to void. Monitor closely. 2.  Diarrhea, likely viral in nature. We will get stools for comprehensive study and Clostridium difficile, monitor.   3.  Flu-like symptoms.  Will check influenza titers, provide symptomatic treatment.  Aggressive intravenous hydration. 4.  Hypertension.  Will continue close monitoring, hold metoprolol at this time.   CODE STATUS: Full code.   TOTAL TIME TAKING CARE OF THIS PATIENT: 55 minutes.   ____________________________ Ellamae SiaVipul S. Sherryll BurgerShah, MD vss:LT D: 04/13/2014 19:22:14 ET T: 04/13/2014 20:00:54 ET JOB#: [id number]  cc: Jemima Petko S. Sherryll BurgerShah, MD, <Dictator> Duane LopeJeffrey D. Judithann SheenSparks, MD Patricia PesaVIPUL S Jeslie Lowe MD ELECTRONICALLY SIGNED 04/14/2014 15:56

## 2014-10-31 NOTE — Discharge Summary (Signed)
PATIENT NAME:  [name], [name] MR#:  [id number] DATE OF BIRTH:  11/30/1940  DATE OF ADMISSION:  04/13/2014 DATE OF DISCHARGE:  04/17/2014  REASON FOR ADMISSION: Dehydration with diarrhea.   HISTORY OF PRESENT ILLNESS: Please see the dictated HPI done by Dr. Sherryll BurgerShah on 04/13/2014.   PAST MEDICAL HISTORY:  1.  Benign hypertension.  2.  Osteoarthritis.  3.  Nephrolithiasis.  4.  Benign prostatic hypertrophy.  5.  Obesity.  6.  Erectile dysfunction.   MEDICATIONS ON ADMISSION: Please see admission note.   ALLERGIES: CODEINE.   SOCIAL HISTORY: Negative for alcohol or tobacco abuse.   FAMILY HISTORY: Positive for ovarian cancer and heart disease.   REVIEW OF SYSTEMS: As per HPI.  PHYSICAL EXAMINATION:  GENERAL: The patient was in no acute distress.  VITAL SIGNS: Stable, he was afebrile.  HEENT: Unremarkable.  NECK: Supple without JVD.  LUNGS: Clear.  CARDIAC: Exam revealed a regular rate and rhythm. Normal S1, S2.  ABDOMEN: Soft and nontender.  EXTREMITIES: Without edema.  NEUROLOGIC: Grossly nonfocal.   LABORATORY DATA: Remarkable for a BUN of 48 with a creatinine of 4.46.   HOSPITAL COURSE: The patient was admitted with acute renal failure with dehydration and diarrhea. He was seen in consultation by nephrology and gastroenterology. Stool cultures were negative. Renal workup was negative. He was hydrated with improvement of his renal function. He was placed empirically on antibiotics because of his diarrhea. Again, the cultures were negative. He improved with conservative therapy. His diet was advanced and he tolerated this well. His diarrhea improved. His renal function improved. By 04/17/2014 the patient was stable and ready for discharge.   DISCHARGE DIAGNOSES:  1.  Acute renal failure.  2.  Dehydration.  3.  Diarrhea.  4.  Viral gastroenteritis.  5.  Benign hypertension.   DISCHARGE MEDICATIONS:  1.  Toprol XL 25 mg p.o. daily.  2.  Zocor 10 mg p.o. at bedtime.  3.   Plavix 75 mg p.o. daily.  4.  Tramadol 50 mg p.o. q. 6 hours p.r.n. pain.  5.  Loperamide 2 mg every 4 hours as needed.  6.  Zofran 4 mg every 4 hours as needed.  7.  Cipro 250 mg p.o. b.i.d. x 1 week.   FOLLOWUP PLANS AND APPOINTMENTS: The patient was discharged on a low residue diet. He will follow up with me within 1 week's time, sooner if needed.    ____________________________ Duane LopeJeffrey D. Judithann SheenSparks, MD jds:lt D: 04/25/2014 10:34:47 ET T: 04/25/2014 10:57:18 ET JOB#: [id number]  cc: Duane LopeJeffrey D. Judithann SheenSparks, MD, <Dictator> Lada Fulbright Rodena Medin Graelyn Bihl MD ELECTRONICALLY SIGNED 04/25/2014 13:22

## 2014-10-31 NOTE — Consult Note (Signed)
See consult note from NP Fransico SettersKim Mills.  Pt first notable symptom was diffuse body aches on Thursday and then diarrhea on Friday.  He likely has a problem with viral syndrome causing severe diarrhea.  Stool cult pending.  Will start Cipro at reduced renal dose at this time while waiting on cultures due to severity of diarrhea.  Will follow with you.  Electronic Signatures: Scot JunElliott, Robert T (MD)  (Signed on 06-Oct-15 17:50)  Authored  Last Updated: 06-Oct-15 17:50 by Scot JunElliott, Robert T (MD)

## 2014-10-31 NOTE — Consult Note (Signed)
Pt doing better, no pain and much less diarrhea.  Probably can go home tomorrow. would finish a 5 day course of Cipro, use Imodium prn.  I will sign off.  Electronic Signatures: Scot JunElliott, Nahmir Zeidman T (MD)  (Signed on 08-Oct-15 18:34)  Authored  Last Updated: 08-Oct-15 18:34 by Scot JunElliott, Nalleli Largent T (MD)

## 2015-02-01 ENCOUNTER — Other Ambulatory Visit: Payer: Self-pay | Admitting: Internal Medicine

## 2015-02-01 DIAGNOSIS — R05 Cough: Secondary | ICD-10-CM

## 2015-02-01 DIAGNOSIS — R053 Chronic cough: Secondary | ICD-10-CM

## 2015-02-01 DIAGNOSIS — R0609 Other forms of dyspnea: Secondary | ICD-10-CM

## 2015-02-08 ENCOUNTER — Ambulatory Visit
Admission: RE | Admit: 2015-02-08 | Discharge: 2015-02-08 | Disposition: A | Discharge status: Home / Self Care | Payer: Medicare Other | Source: Ambulatory Visit | Attending: Internal Medicine | Admitting: Internal Medicine

## 2015-02-08 DIAGNOSIS — R0602 Shortness of breath: Secondary | ICD-10-CM | POA: Diagnosis present

## 2015-02-08 DIAGNOSIS — R599 Enlarged lymph nodes, unspecified: Secondary | ICD-10-CM | POA: Insufficient documentation

## 2015-02-08 DIAGNOSIS — R053 Chronic cough: Secondary | ICD-10-CM

## 2015-02-08 DIAGNOSIS — R05 Cough: Secondary | ICD-10-CM | POA: Insufficient documentation

## 2015-02-08 DIAGNOSIS — R0609 Other forms of dyspnea: Secondary | ICD-10-CM

## 2015-02-08 DIAGNOSIS — N289 Disorder of kidney and ureter, unspecified: Secondary | ICD-10-CM | POA: Insufficient documentation

## 2015-03-01 ENCOUNTER — Other Ambulatory Visit: Payer: Self-pay | Admitting: Specialist

## 2015-03-01 DIAGNOSIS — R590 Localized enlarged lymph nodes: Secondary | ICD-10-CM

## 2015-04-09 ENCOUNTER — Ambulatory Visit (INDEPENDENT_AMBULATORY_CARE_PROVIDER_SITE_OTHER): Payer: Medicare Other | Admitting: Cardiovascular Disease

## 2015-04-09 ENCOUNTER — Encounter: Payer: Self-pay | Admitting: Cardiovascular Disease

## 2015-04-09 VITALS — BP 142/88 | HR 56 | Ht 72.0 in | Wt 228.0 lb

## 2015-04-09 DIAGNOSIS — R5383 Other fatigue: Secondary | ICD-10-CM

## 2015-04-09 DIAGNOSIS — Z955 Presence of coronary angioplasty implant and graft: Secondary | ICD-10-CM | POA: Diagnosis not present

## 2015-04-09 DIAGNOSIS — R5381 Other malaise: Secondary | ICD-10-CM

## 2015-04-09 DIAGNOSIS — R42 Dizziness and giddiness: Secondary | ICD-10-CM

## 2015-04-09 DIAGNOSIS — E785 Hyperlipidemia, unspecified: Secondary | ICD-10-CM

## 2015-04-09 DIAGNOSIS — I10 Essential (primary) hypertension: Secondary | ICD-10-CM

## 2015-04-09 DIAGNOSIS — R0602 Shortness of breath: Secondary | ICD-10-CM | POA: Insufficient documentation

## 2015-04-09 DIAGNOSIS — I25119 Atherosclerotic heart disease of native coronary artery with unspecified angina pectoris: Secondary | ICD-10-CM

## 2015-04-09 NOTE — Assessment & Plan Note (Signed)
Etiology of his congestion is unclear. Rales in his lungs bilaterally at the bases suggesting a pulmonary etiology CT scan of the chest reviewed with him in detail, this shows significant three-vessel coronary artery disease. We have scheduled a stress test given his shortness of breath and general malaise and known coronary artery disease

## 2015-04-09 NOTE — Assessment & Plan Note (Addendum)
Stress test ordered to rule out ischemia given symptoms of chest congestion, shortness of breath with exertion If this shows no ischemia, clinical exam suggests pulmonary issue as he has rales at the bases bilaterally Recommended he talk with primary care to start an inhaler Could even try pulse prednisone

## 2015-04-09 NOTE — Assessment & Plan Note (Signed)
Blood pressure is well controlled on today's visit. No changes made to the medications. 

## 2015-04-09 NOTE — Assessment & Plan Note (Signed)
Cholesterol is at goal on the current lipid regimen. No changes to the medications were made.  

## 2015-04-09 NOTE — Progress Notes (Signed)
Patient ID: JYDEN KROMER, male    DOB: August 05, 1940, 74 y.o.   MRN: [id number]  HPI Comments: Mr. Berkovich is a very pleasant 74 year old male with a history of coronary artery disease, status post previous stenting of the LAD and right coronary artery with drug-eluting stents.   catheterization in April 2005, which showed moderate diffuse nonobstructive disease.  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 EF 54%.  He also has a history of hypertension and hyperlipidemia as well as kidney stones. He presents for routine follow up of his coronary artery disease  In follow-up, he reports that it has been a difficult year. He has severe diarrhea October 2015 and was put in the hospital for dehydration He developed the flu in February 2016 Since then he has had chest congestion, shortness of breath Seen by primary care physician, echocardiogram July 2016 was essentially normal Seen by pulmonary, Dr. Meredeth Ide He is walking 3 up to 4 miles several days per week but reports having continued congestion Denies any leg edema, abdominal bloating, weight gain concerning for fluid retention He wonders if it could be cardiac related  Lab work reviewed with him showing total cholesterol 137, LDL 79  EKG on today's visit shows normal sinus rhythm with rate 56 bpm, no significant ST or T-wave changes   other past medical history Lab work 01/29/2014 showing total cholesterol 133, LDL 79, HDL 39 Previously had problems with chronic dizziness which he attributes to in her ear problem.  he is working full-time job.      Allergies  Allergen Reactions  . Codeine     REACTION: Vomiting    Current Outpatient Prescriptions on File Prior to Visit  Medication Sig Dispense Refill  . aspirin 81 MG tablet Take 81 mg by mouth daily.      . clopidogrel (PLAVIX) 75 MG tablet Take 1 tablet (75 mg total) by mouth daily. 90 tablet 3  . Fexofenadine HCl (ALLEGRA PO)  Take by mouth as needed.    . metoprolol succinate (TOPROL-XL) 50 MG 24 hr tablet Take 1 tablet (50 mg total) by mouth daily. 90 tablet 3  . Multiple Vitamins-Minerals (CENTRUM PO) Take by mouth daily.      . Omega-3 Fatty Acids (FISH OIL) 1000 MG CAPS Take 1,000 mg by mouth 2 (two) times daily.    . simvastatin (ZOCOR) 40 MG tablet Take 1 tablet (40 mg total) by mouth at bedtime. 90 tablet 3   No current facility-administered medications on file prior to visit.    Past Medical History  Diagnosis Date  . CAD (coronary artery disease)     unspecified site. s/p DES to LAD and RCA  . Hyperlipidemia, mixed   . HTN (hypertension)   . ED (erectile dysfunction) of non-organic origin     History reviewed. No pertinent past surgical history.  Social History  reports that he has never smoked. He does not have any smokeless tobacco history on file. He reports that he does not drink alcohol or use illicit drugs.  Family History Family history is unknown by patient.   Review of Systems  Constitutional: Positive for fatigue.  Respiratory: Positive for shortness of breath.   Cardiovascular: Negative.   Gastrointestinal: Negative.   Musculoskeletal: Negative.   Neurological: Negative.   Hematological: Negative.   Psychiatric/Behavioral: Negative.   All other systems reviewed and are negative.   BP 142/88 mmHg  Pulse 56  Ht  6' (1.829 m)  Wt 228 lb (103.42 kg)  BMI 30.92 kg/m2  Physical Exam  Constitutional: He is oriented to person, place, and time. He appears well-developed and well-nourished.  HENT:  Head: Normocephalic.  Nose: Nose normal.  Mouth/Throat: Oropharynx is clear and moist.  Eyes: Conjunctivae are normal. Pupils are equal, round, and reactive to light.  Neck: Normal range of motion. Neck supple. No JVD present.  Cardiovascular: Normal rate, regular rhythm, S1 normal, S2 normal, normal heart sounds and intact distal pulses.  Exam reveals no gallop and no friction rub.    No murmur heard. Pulmonary/Chest: Effort normal. No respiratory distress. He has no wheezes. He has rales. He exhibits no tenderness.  Rales at the bases bilaterally  Abdominal: Soft. Bowel sounds are normal. He exhibits no distension. There is no tenderness.  Musculoskeletal: Normal range of motion. He exhibits no edema or tenderness.  Lymphadenopathy:    He has no cervical adenopathy.  Neurological: He is alert and oriented to person, place, and time. Coordination normal.  Skin: Skin is warm and dry. No rash noted. No erythema.  Psychiatric: He has a normal mood and affect. His behavior is normal. Judgment and thought content normal.      Assessment and Plan   Nursing note and vitals reviewed.

## 2015-04-09 NOTE — Patient Instructions (Addendum)
You are doing well. No medication changes were made.  We will schedule a exercise stress test for shortness of breath, coronary disease, h/o stent. You are scheduled for:________________________________ Please do not take your metoprolol the morning of your test  Please call us if you have new issues that need to be addressed before your next appt.  Your physician wants you to follow-up in: 12 months.  You will receive a reminder letter in the mail two months in advance. If you don't receive a letter, please call our office to schedule the follow-up appointment.  Exercise Stress Electrocardiogram An exercise stress electrocardiogram is a test that is done to evaluate the blood supply to your heart. This test may also be called exercise stress electrocardiography. The test is done while you are walking on a treadmill. The goal of this test is to raise your heart rate. This test is done to find areas of poor blood flow to the heart by determining the extent of coronary artery disease (CAD).   CAD is defined as narrowing in one or more heart (coronary) arteries of more than 70%. If you have an abnormal test result, this may mean that you are not getting adequate blood flow to your heart during exercise. Additional testing may be needed to understand why your test was abnormal. LET [facility] CARE [name] KNOW ABOUT:   Any allergies you have.  All medicines you are taking, including vitamins, herbs, eye drops, creams, and over-the-counter medicines.  Previous problems you or members of your family have had with the use of anesthetics.  Any blood disorders you have.  Previous surgeries you have had.  Medical conditions you have.  Possibility of pregnancy, if this applies. RISKS AND COMPLICATIONS Generally, this is a safe procedure. However, as with any procedure, complications can occur. Possible complications can include:  Pain or pressure in the following areas:  Chest.  Jaw or  neck.  Between your shoulder blades.  Radiating down your left arm.  Dizziness or light-headedness.  Shortness of breath.  Increased or irregular heartbeats.  Nausea or vomiting.  Heart attack (rare). BEFORE THE PROCEDURE  Avoid all forms of caffeine 24 hours before your test or as directed by your health care [name]. This includes coffee, tea (even decaffeinated tea), caffeinated sodas, chocolate, cocoa, and certain pain medicines.  Follow your health care [name]'s instructions regarding eating and drinking before the test.  Take your medicines as directed at regular times with water unless instructed otherwise. Exceptions may include:  If you have diabetes, ask how you are to take your insulin or pills. It is common to adjust insulin dosing the morning of the test.  If you are taking beta-blocker medicines, it is important to talk to your health care [name] about these medicines well before the date of your test. Taking beta-blocker medicines may interfere with the test. In some cases, these medicines need to be changed or stopped 24 hours or more before the test.  If you wear a nitroglycerin patch, it may need to be removed prior to the test. Ask your health care [name] if the patch should be removed before the test.  If you use an inhaler for any breathing condition, bring it with you to the test.  If you are an outpatient, bring a snack so you can eat right after the stress phase of the test.  Do not smoke for 4 hours prior to the test or as directed by your health care [name].  Do  not apply lotions, powders, creams, or oils on your chest prior to the test.  Wear loose-fitting clothes and comfortable shoes for the test. This test involves walking on a treadmill. PROCEDURE  Multiple patches (electrodes) will be put on your chest. If needed, small areas of your chest may have to be shaved to get better contact with the electrodes. Once the electrodes are attached  to your body, multiple wires will be attached to the electrodes and your heart rate will be monitored.  Your heart will be monitored both at rest and while exercising.  You will walk on a treadmill. The treadmill will be started at a slow pace. The treadmill speed and incline will gradually be increased to raise your heart rate. AFTER THE PROCEDURE  Your heart rate and blood pressure will be monitored after the test.  You may return to your normal schedule including diet, activities, and medicines, unless your health care [name] tells you otherwise. Document Released: 06/23/2000 Document Revised: 07/01/2013 Document Reviewed: 03/03/2013 [facility] Patient Information 2015 [place], [place]. This information is not intended to replace advice given to you by your health care [name]. Make sure you discuss any questions you have with your health care [name].

## 2015-04-09 NOTE — Assessment & Plan Note (Signed)
Etiology of his symptoms is unclear. Stress test ordered as above

## 2015-04-14 ENCOUNTER — Ambulatory Visit (INDEPENDENT_AMBULATORY_CARE_PROVIDER_SITE_OTHER): Payer: Medicare Other

## 2015-04-14 DIAGNOSIS — Z955 Presence of coronary angioplasty implant and graft: Secondary | ICD-10-CM

## 2015-04-14 DIAGNOSIS — I25119 Atherosclerotic heart disease of native coronary artery with unspecified angina pectoris: Secondary | ICD-10-CM | POA: Diagnosis not present

## 2015-04-14 DIAGNOSIS — R42 Dizziness and giddiness: Secondary | ICD-10-CM | POA: Diagnosis not present

## 2015-04-14 DIAGNOSIS — R0602 Shortness of breath: Secondary | ICD-10-CM | POA: Diagnosis not present

## 2015-04-15 LAB — EXERCISE TOLERANCE TEST
CSEPHR: 71 %
Estimated workload: 7.2 METS
Exercise duration (min): 6 min
Exercise duration (sec): 8 s
MPHR: 145 {beats}/min
Peak HR: 103 {beats}/min
Rest HR: 72 {beats}/min

## 2015-04-16 ENCOUNTER — Other Ambulatory Visit: Payer: Self-pay

## 2015-04-16 DIAGNOSIS — R079 Chest pain, unspecified: Secondary | ICD-10-CM

## 2015-04-21 ENCOUNTER — Telehealth: Payer: Self-pay

## 2015-04-21 NOTE — Telephone Encounter (Signed)
Pt daughter called, states that pt has not received paperwork regarding his stress test. Please call. Pt stress test is in the morning. Daughter states the contact # is her work, if she is not there, her dad will be there.

## 2015-04-21 NOTE — Telephone Encounter (Signed)
Spoke w/ Patty.  Advised her of instructions. She verbalizes understanding and will call back w/ any questions or concerns.

## 2015-04-22 ENCOUNTER — Encounter
Admission: RE | Admit: 2015-04-22 | Discharge: 2015-04-22 | Disposition: A | Discharge status: Home / Self Care | Payer: Medicare Other | Source: Ambulatory Visit | Attending: Cardiovascular Disease | Admitting: Cardiovascular Disease

## 2015-04-22 DIAGNOSIS — R079 Chest pain, unspecified: Secondary | ICD-10-CM

## 2015-04-22 HISTORY — DX: Acute myocardial infarction, unspecified: I21.9

## 2015-04-22 LAB — NM MYOCAR MULTI W/SPECT W/WALL MOTION / EF
CHL CUP NUCLEAR SRS: 8
CHL CUP NUCLEAR SSS: 22
CHL CUP STRESS STAGE 1 HR: 59 {beats}/min
CHL CUP STRESS STAGE 2 GRADE: 0 %
CHL CUP STRESS STAGE 2 SPEED: 0 mph
CHL CUP STRESS STAGE 3 GRADE: 0 %
CHL CUP STRESS STAGE 3 HR: 59 {beats}/min
CHL CUP STRESS STAGE 3 SPEED: 0 mph
CHL CUP STRESS STAGE 4 HR: 65 {beats}/min
CHL CUP STRESS STAGE 5 GRADE: 0 %
CHL CUP STRESS STAGE 5 SPEED: 0 mph
CHL CUP STRESS STAGE 6 DBP: 78 mmHg
CHL CUP STRESS STAGE 6 HR: 68 {beats}/min
CSEPHR: 51 %
Estimated workload: 1 METS
Exercise duration (min): 0 min
Exercise duration (sec): 0 s
LV dias vol: 107 mL
LV sys vol: 51 mL
MPHR: 145 {beats}/min
NUC STRESS TID: 0.93
Peak HR: 65 {beats}/min
Percent of predicted max HR: 44 %
Rest HR: 65 {beats}/min
SDS: 14
Stage 2 HR: 59 {beats}/min
Stage 4 Grade: 0 %
Stage 4 Speed: 0 mph
Stage 5 HR: 72 {beats}/min
Stage 6 Grade: 0 %
Stage 6 SBP: 155 mmHg
Stage 6 Speed: 0 mph

## 2015-04-22 MED ORDER — REGADENOSON 0.4 MG/5ML IV SOLN
0.4000 mg | Freq: Once | INTRAVENOUS | Status: AC
  Administered 2015-04-22: 0.4 mg via INTRAVENOUS

## 2015-04-22 MED ORDER — TECHNETIUM TC 99M SESTAMIBI - CARDIOLITE
12.3100 | Freq: Once | INTRAVENOUS | Status: AC | PRN
  Administered 2015-04-22: 12.31 via INTRAVENOUS

## 2015-04-22 MED ORDER — TECHNETIUM TC 99M SESTAMIBI - CARDIOLITE
30.3800 | Freq: Once | INTRAVENOUS | Status: AC | PRN
  Administered 2015-04-22: 30.38 via INTRAVENOUS

## 2015-04-26 ENCOUNTER — Telehealth: Payer: Self-pay | Admitting: *Deleted

## 2015-04-26 NOTE — Telephone Encounter (Signed)
Pt daughter returning our call  Pt would like to go ahead and do the cath.  But he would like to come in and go over some questions he has.  But he wants to make sure where and how and what we will be doing.  Not sure if we can do this over the phone or not.  Please advise.

## 2015-04-26 NOTE — Telephone Encounter (Signed)
Please see stress test result note.

## 2015-04-27 ENCOUNTER — Ambulatory Visit (INDEPENDENT_AMBULATORY_CARE_PROVIDER_SITE_OTHER): Payer: Medicare Other | Admitting: Cardiovascular Disease

## 2015-04-27 ENCOUNTER — Encounter: Payer: Self-pay | Admitting: Cardiovascular Disease

## 2015-04-27 VITALS — BP 144/86 | HR 60 | Ht 72.0 in | Wt 221.8 lb

## 2015-04-27 DIAGNOSIS — R931 Abnormal findings on diagnostic imaging of heart and coronary circulation: Secondary | ICD-10-CM | POA: Diagnosis not present

## 2015-04-27 DIAGNOSIS — I25119 Atherosclerotic heart disease of native coronary artery with unspecified angina pectoris: Secondary | ICD-10-CM | POA: Diagnosis not present

## 2015-04-27 DIAGNOSIS — I10 Essential (primary) hypertension: Secondary | ICD-10-CM

## 2015-04-27 DIAGNOSIS — I209 Angina pectoris, unspecified: Secondary | ICD-10-CM

## 2015-04-27 DIAGNOSIS — R9439 Abnormal result of other cardiovascular function study: Secondary | ICD-10-CM

## 2015-04-27 DIAGNOSIS — R0602 Shortness of breath: Secondary | ICD-10-CM

## 2015-04-27 DIAGNOSIS — E785 Hyperlipidemia, unspecified: Secondary | ICD-10-CM

## 2015-04-27 NOTE — Assessment & Plan Note (Signed)
Blood pressure is well controlled on today's visit. No changes made to the medications. 

## 2015-04-27 NOTE — Progress Notes (Signed)
Patient ID: [name], male    DOB: 1940/10/02, 74 y.o.   MRN: [id number]  HPI Comments: [name] is a very pleasant 74 year old male with a history of coronary artery disease, status post previous stenting of the LAD and right coronary artery with drug-eluting stents.   catheterization in April 2005, which showed moderate diffuse nonobstructive disease.  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 EF 54%.  He also has a history of hypertension and hyperlipidemia as well as kidney stones. He presents for routine follow up of his stress test  Stress test recently ordered for chest discomfort, shortness of breath This showed anterior wall ischemia extending over to the anterolateral region Results discussed with [name] in detail. He has been sedentary in general, chest congestion and shortness of breath He is been seen by pulmonary, had CT scan 02/08/2015 He is concerned about his coronary disease in the setting of recent positive stress test  EKG on today's visit shows normal sinus rhythm with rate 60 bpm, no significant ST or T-wave changes  Other past medical history He has severe diarrhea October 2015 and was put in the hospital for dehydration He developed the flu in February 2016  Lab work reviewed with [name] showing total cholesterol 137, LDL 79  Lab work 01/29/2014 showing total cholesterol 133, LDL 79, HDL 39 Previously had problems with chronic dizziness which he attributes to in her ear problem.  he is working full-time job.      Allergies  Allergen Reactions  . Codeine     REACTION: Vomiting    Current Outpatient Prescriptions on File Prior to Visit  Medication Sig Dispense Refill  . aspirin 81 MG tablet Take 81 mg by mouth daily.      . clopidogrel (PLAVIX) 75 MG tablet Take 1 tablet (75 mg total) by mouth daily. 90 tablet 3  . Fexofenadine HCl (ALLEGRA PO) Take by mouth as needed.    . metoprolol succinate  (TOPROL-XL) 50 MG 24 hr tablet Take 1 tablet (50 mg total) by mouth daily. 90 tablet 3  . Multiple Vitamins-Minerals (CENTRUM PO) Take by mouth daily.      . Omega-3 Fatty Acids (FISH OIL) 1000 MG CAPS Take 1,000 mg by mouth 2 (two) times daily.    . simvastatin (ZOCOR) 40 MG tablet Take 1 tablet (40 mg total) by mouth at bedtime. 90 tablet 3   No current facility-administered medications on file prior to visit.    Past Medical History  Diagnosis Date  . CAD (coronary artery disease)     unspecified site. s/p DES to LAD and RCA  . Hyperlipidemia, mixed   . HTN (hypertension)   . ED (erectile dysfunction) of non-organic origin   . Myocardial infarction [facility]) 2000    Past Surgical History  Procedure Laterality Date  . Coronary angioplasty      3 stents  . Back surgery      2 lunbar surgeries    Social History  reports that he has never smoked. He does not have any smokeless tobacco history on file. He reports that he does not drink alcohol or use illicit drugs.  Family History Family history is unknown by patient.   Review of Systems  Constitutional: Positive for fatigue.  Respiratory: Positive for shortness of breath.   Cardiovascular: Negative.   Gastrointestinal: Negative.   Musculoskeletal: Negative.   Neurological: Negative.   Hematological: Negative.   Psychiatric/Behavioral:  Negative.   All other systems reviewed and are negative.   BP 144/86 mmHg  Pulse 60  Ht 6' (1.829 m)  Wt 221 lb 12 oz (100.585 kg)  BMI 30.07 kg/m2  Physical Exam  Constitutional: He is oriented to person, place, and time. He appears well-developed and well-nourished.  HENT:  Head: Normocephalic.  Nose: Nose normal.  Mouth/Throat: Oropharynx is clear and moist.  Eyes: Conjunctivae are normal. Pupils are equal, round, and reactive to light.  Neck: Normal range of motion. Neck supple. No JVD present.  Cardiovascular: Normal rate, regular rhythm, S1 normal, S2 normal, normal heart  sounds and intact distal pulses.  Exam reveals no gallop and no friction rub.   No murmur heard. Pulmonary/Chest: Effort normal. No respiratory distress. He has no wheezes. He has rales. He exhibits no tenderness.  Rales at the bases bilaterally  Abdominal: Soft. Bowel sounds are normal. He exhibits no distension. There is no tenderness.  Musculoskeletal: Normal range of motion. He exhibits no edema or tenderness.  Lymphadenopathy:    He has no cervical adenopathy.  Neurological: He is alert and oriented to person, place, and time. Coordination normal.  Skin: Skin is warm and dry. No rash noted. No erythema.  Psychiatric: He has a normal mood and affect. His behavior is normal. Judgment and thought content normal.      Assessment and Plan   Nursing note and vitals reviewed.

## 2015-04-27 NOTE — Assessment & Plan Note (Signed)
Stress test ordered initially for symptoms of shortness of breath. He has been seen by pulmonary Cardiac catheterization scheduled given his symptoms concerning for ischemia

## 2015-04-27 NOTE — Assessment & Plan Note (Addendum)
Recent positive stress test, shortness of breath and chest congestion. Results discussed with him. Known coronary artery disease, prior stenting. We'll schedule him for cardiac catheterization at the end of this week Ejection fraction on stress test was 52%. No recent heart failure symptoms Risk and benefit of catheterization discussed with him in detail including risk of stroke and heart attack

## 2015-04-27 NOTE — Telephone Encounter (Signed)
This encounter was created in error - please disregard.

## 2015-04-27 NOTE — Assessment & Plan Note (Signed)
Cholesterol is at goal on the current lipid regimen. No changes to the medications were made.  

## 2015-04-27 NOTE — Patient Instructions (Addendum)
No medication changes were made.  Your stress test is positive for blockage We will schedule a cardiac cath for Friday  Please call us if you have new issues that need to be addressed before your next appt.  Your physician wants you to follow-up in: 1 month   *per scheduling, your recent CT will replace an x-ray before your cath  [facility] Cardiac Cath Instructions   You are scheduled for a Cardiac Cath on:___Friday, October 21_____  Please arrive at 7:30 am on the day of your procedure  You will need to pre-register prior to the day of your procedure.  Enter through the [employer] at [facility].  Registration is the first desk on your right.  Please take the procedure order we have given you in order to be registered appropriately  Do not eat/drink anything after midnight  Someone will need to drive you home  It is recommended someone be with you for the first 24 hours after your procedure  Wear clothes that are easy to get on/off and wear slip on shoes if possible   Medications bring a current list of all medications with you  __X_ You may take all of your medications the morning of your procedure with enough water to swallow safely  Day of your procedure: [facility] at [facility] entrance.  Free valet service is available.  After entering the [facility] please check-in at the registration desk (1st desk on your right) to receive your armband. After receiving your armband someone will escort you to the cardiac cath/special procedures waiting area.  The usual length of stay after your procedure is about 2 to 3 hours.  This can vary.  If you have any questions, please call our office at [phone].  Angiogram An angiogram, also called angiography, is a procedure used to look at the blood vessels. In this procedure, dye is injected through a long, thin tube (catheter) into an artery. X-rays are then taken. The X-rays will show if there is a blockage or problem in a blood vessel.   LET [facility] CARE PROVIDER KNOW ABOUT:  Any allergies you have, including allergies to shellfish or contrast dye.   All medicines you are taking, including vitamins, herbs, eye drops, creams, and over-the-counter medicines.   Previous problems you or members of your family have had with the use of anesthetics.   Any blood disorders you have.   Previous surgeries you have had.  Any previous kidney problems or failure you have had.  Medical conditions you have.   Possibility of pregnancy, if this applies. RISKS AND COMPLICATIONS Generally, an angiogram is a safe procedure. However, as with any procedure, problems can occur. Possible problems include:  Injury to the blood vessels, including rupture or bleeding.  Infection or bruising at the catheter site.  Allergic reaction to the dye or contrast used.  Kidney damage from the dye or contrast used.  Blood clots that can lead to a stroke or heart attack. BEFORE THE PROCEDURE  Do not eat or drink after midnight on the night before the procedure, or as directed by your health care provider.   Ask your health care provider if you may drink enough water to take any needed medicines the morning of the procedure.  PROCEDURE  You may be given a medicine to help you relax (sedative) before and during the procedure. This medicine is given through an IV access tube that is inserted into one of your veins.   The area  where the catheter will be inserted will be washed and shaved. This is usually done in the groin but may be done in the fold of your arm (near your elbow) or in the wrist.  A medicine will be given to numb the area where the catheter will be inserted (local anesthetic).  The catheter will be inserted with a guide wire into an artery. The catheter is guided by using a type of X-ray (fluoroscopy) to the blood vessel being examined.   Dye is then injected into the catheter, and X-rays are taken. The dye helps to  show where any narrowing or blockages are located.  AFTER THE PROCEDURE   If the procedure is done through the leg, you will be kept in bed lying flat for several hours. You will be instructed to not bend or cross your legs.  The insertion site will be checked frequently.  The pulse in your feet or wrist will be checked frequently.  Additional blood tests, X-rays, and electrocardiography may be done.   You may need to stay in the hospital overnight for observation.    This information is not intended to replace advice given to you by your health care provider. Make sure you discuss any questions you have with your health care provider.   Document Released: 04/05/2005 Document Revised: 07/17/2014 Document Reviewed: 11/27/2012 Elsevier Interactive Patient Education [employer]2016 [employer] Inc.

## 2015-04-28 LAB — CBC WITH DIFFERENTIAL/PLATELET
BASOS ABS: 0 10*3/uL (ref 0.0–0.2)
Basos: 1 %
EOS (ABSOLUTE): 0.2 10*3/uL (ref 0.0–0.4)
Eos: 4 %
Hematocrit: 38.9 % (ref 37.5–51.0)
Hemoglobin: 13.1 g/dL (ref 12.6–17.7)
IMMATURE GRANS (ABS): 0 10*3/uL (ref 0.0–0.1)
IMMATURE GRANULOCYTES: 0 %
LYMPHS: 26 %
Lymphocytes Absolute: 1.4 10*3/uL (ref 0.7–3.1)
MCH: 31.3 pg (ref 26.6–33.0)
MCHC: 33.7 g/dL (ref 31.5–35.7)
MCV: 93 fL (ref 79–97)
MONOS ABS: 0.4 10*3/uL (ref 0.1–0.9)
Monocytes: 8 %
Neutrophils Absolute: 3.3 10*3/uL (ref 1.4–7.0)
Neutrophils: 61 %
PLATELETS: 146 10*3/uL — AB (ref 150–379)
RBC: 4.18 x10E6/uL (ref 4.14–5.80)
RDW: 13.9 % (ref 12.3–15.4)
WBC: 5.3 10*3/uL (ref 3.4–10.8)

## 2015-04-28 LAB — BASIC METABOLIC PANEL
BUN/Creatinine Ratio: 17 (ref 10–22)
BUN: 16 mg/dL (ref 8–27)
CHLORIDE: 105 mmol/L (ref 97–106)
CO2: 22 mmol/L (ref 18–29)
Calcium: 8.9 mg/dL (ref 8.6–10.2)
Creatinine, Ser: 0.94 mg/dL (ref 0.76–1.27)
GFR calc Af Amer: 91 mL/min/{1.73_m2} (ref >59)
GFR calc non Af Amer: 79 mL/min/{1.73_m2} (ref >59)
GLUCOSE: 105 mg/dL — AB (ref 65–99)
POTASSIUM: 4.1 mmol/L (ref 3.5–5.2)
Sodium: 142 mmol/L (ref 136–144)

## 2015-04-28 LAB — PROTIME-INR
INR: 1 (ref 0.8–1.2)
Prothrombin Time: 10.8 s (ref 9.1–12.0)

## 2015-04-29 ENCOUNTER — Other Ambulatory Visit: Payer: Self-pay | Admitting: Cardiovascular Disease

## 2015-04-29 DIAGNOSIS — I209 Angina pectoris, unspecified: Secondary | ICD-10-CM

## 2015-04-30 ENCOUNTER — Encounter: Payer: Self-pay | Admitting: *Deleted

## 2015-04-30 ENCOUNTER — Other Ambulatory Visit: Payer: Self-pay

## 2015-04-30 ENCOUNTER — Encounter
Admission: RE | Disposition: A | Discharge status: Home / Self Care | Payer: Self-pay | Source: Ambulatory Visit | Attending: Cardiovascular Disease

## 2015-04-30 ENCOUNTER — Other Ambulatory Visit: Payer: Self-pay | Admitting: Cardiovascular Disease

## 2015-04-30 ENCOUNTER — Ambulatory Visit
Admission: RE | Admit: 2015-04-30 | Discharge: 2015-04-30 | Disposition: A | Discharge status: Home / Self Care | Payer: Medicare Other | Source: Ambulatory Visit | Attending: Cardiovascular Disease | Admitting: Cardiovascular Disease

## 2015-04-30 ENCOUNTER — Telehealth: Payer: Self-pay | Admitting: *Deleted

## 2015-04-30 ENCOUNTER — Telehealth: Payer: Self-pay

## 2015-04-30 DIAGNOSIS — I259 Chronic ischemic heart disease, unspecified: Secondary | ICD-10-CM | POA: Diagnosis not present

## 2015-04-30 DIAGNOSIS — I209 Angina pectoris, unspecified: Secondary | ICD-10-CM | POA: Diagnosis present

## 2015-04-30 DIAGNOSIS — I10 Essential (primary) hypertension: Secondary | ICD-10-CM | POA: Diagnosis not present

## 2015-04-30 DIAGNOSIS — R5383 Other fatigue: Secondary | ICD-10-CM | POA: Diagnosis present

## 2015-04-30 DIAGNOSIS — I25119 Atherosclerotic heart disease of native coronary artery with unspecified angina pectoris: Secondary | ICD-10-CM

## 2015-04-30 DIAGNOSIS — I252 Old myocardial infarction: Secondary | ICD-10-CM | POA: Diagnosis not present

## 2015-04-30 DIAGNOSIS — Z7902 Long term (current) use of antithrombotics/antiplatelets: Secondary | ICD-10-CM | POA: Diagnosis not present

## 2015-04-30 DIAGNOSIS — Z885 Allergy status to narcotic agent status: Secondary | ICD-10-CM | POA: Diagnosis not present

## 2015-04-30 DIAGNOSIS — Z955 Presence of coronary angioplasty implant and graft: Secondary | ICD-10-CM | POA: Diagnosis not present

## 2015-04-30 DIAGNOSIS — I2510 Atherosclerotic heart disease of native coronary artery without angina pectoris: Secondary | ICD-10-CM | POA: Diagnosis not present

## 2015-04-30 DIAGNOSIS — Z7982 Long term (current) use of aspirin: Secondary | ICD-10-CM | POA: Insufficient documentation

## 2015-04-30 DIAGNOSIS — R931 Abnormal findings on diagnostic imaging of heart and coronary circulation: Secondary | ICD-10-CM

## 2015-04-30 DIAGNOSIS — Z79899 Other long term (current) drug therapy: Secondary | ICD-10-CM | POA: Diagnosis not present

## 2015-04-30 DIAGNOSIS — R0602 Shortness of breath: Secondary | ICD-10-CM | POA: Diagnosis present

## 2015-04-30 DIAGNOSIS — E785 Hyperlipidemia, unspecified: Secondary | ICD-10-CM | POA: Diagnosis not present

## 2015-04-30 DIAGNOSIS — R079 Chest pain, unspecified: Secondary | ICD-10-CM | POA: Diagnosis present

## 2015-04-30 HISTORY — PX: CARDIAC CATHETERIZATION: SHX172

## 2015-04-30 SURGERY — LEFT HEART CATH AND CORONARY ANGIOGRAPHY
Anesthesia: Moderate Sedation | Laterality: Bilateral

## 2015-04-30 SURGERY — LEFT HEART CATH
Anesthesia: Moderate Sedation

## 2015-04-30 MED ORDER — SODIUM CHLORIDE 0.9 % WEIGHT BASED INFUSION
1.0000 mL/kg/h | INTRAVENOUS | Status: DC
  Administered 2015-04-30: 1 mL/kg/h via INTRAVENOUS

## 2015-04-30 MED ORDER — FENTANYL CITRATE (PF) 100 MCG/2ML IJ SOLN
INTRAMUSCULAR | Status: AC
  Filled 2015-04-30: qty 2

## 2015-04-30 MED ORDER — SODIUM CHLORIDE 0.9 % WEIGHT BASED INFUSION: 3.0000 mL/kg/h | INTRAVENOUS | Status: DC

## 2015-04-30 MED ORDER — ACETAMINOPHEN 500 MG PO TABS
1000.0000 mg | ORAL_TABLET | Freq: Once | ORAL | Status: AC
  Administered 2015-04-30: 1000 mg via ORAL

## 2015-04-30 MED ORDER — FENTANYL CITRATE (PF) 100 MCG/2ML IJ SOLN
INTRAMUSCULAR | Status: DC | PRN
  Administered 2015-04-30: 50 ug via INTRAVENOUS

## 2015-04-30 MED ORDER — IOHEXOL 300 MG/ML  SOLN
INTRAMUSCULAR | Status: DC | PRN
Start: 2015-04-30 — End: 2015-04-30
  Administered 2015-04-30: 110 mL via INTRA_ARTERIAL

## 2015-04-30 MED ORDER — MIDAZOLAM HCL 2 MG/2ML IJ SOLN
INTRAMUSCULAR | Status: DC | PRN
  Administered 2015-04-30: 1 mg via INTRAVENOUS

## 2015-04-30 MED ORDER — HEPARIN (PORCINE) IN NACL 2-0.9 UNIT/ML-% IJ SOLN
INTRAMUSCULAR | Status: AC
  Filled 2015-04-30: qty 500

## 2015-04-30 MED ORDER — NITROGLYCERIN 0.4 MG SL SUBL: 0.4000 mg | SUBLINGUAL_TABLET | SUBLINGUAL | Status: DC | PRN

## 2015-04-30 MED ORDER — ASPIRIN 81 MG PO CHEW: 81.0000 mg | CHEWABLE_TABLET | ORAL | Status: DC

## 2015-04-30 MED ORDER — MIDAZOLAM HCL 2 MG/2ML IJ SOLN
INTRAMUSCULAR | Status: AC
  Filled 2015-04-30: qty 2

## 2015-04-30 MED ORDER — ACETAMINOPHEN 500 MG PO TABS
ORAL_TABLET | ORAL | Status: AC
  Filled 2015-04-30: qty 2

## 2015-04-30 SURGICAL SUPPLY — 10 items
CATH INFINITI 5FR ANG PIGTAIL (CATHETERS) ×3 IMPLANT
CATH INFINITI 5FR JL4 (CATHETERS) ×3 IMPLANT
CATH INFINITI JR4 5F (CATHETERS) ×3 IMPLANT
DEVICE CLOSURE MYNXGRIP 5F (Vascular Products) ×2 IMPLANT
KIT MANI 3VAL PERCEP (MISCELLANEOUS) ×3 IMPLANT
NDL PERC 18GX7CM (NEEDLE) ×1 IMPLANT
NEEDLE PERC 18GX7CM (NEEDLE) ×3 IMPLANT
PACK CARDIAC CATH (CUSTOM PROCEDURE TRAY) ×3 IMPLANT
SHEATH AVANTI 5FR X 11CM (SHEATH) ×3 IMPLANT
WIRE EMERALD 3MM-J .035X150CM (WIRE) ×3 IMPLANT

## 2015-04-30 NOTE — Telephone Encounter (Signed)
[facility] [phone] of referral for possible bypass surgery Darl PikesSusan states person who schedules will be back Monday and will work on it at that time.

## 2015-04-30 NOTE — Telephone Encounter (Signed)
Please call [name] at work for any upcoming appointments.

## 2015-04-30 NOTE — Discharge Instructions (Signed)

## 2015-04-30 NOTE — OR Nursing (Signed)
1015 hematoma with ridge noted upon assessment, pressure held x 20 min. Until ridge soft,  Ridge marked. Continue to assess, Cath lab tech in to assess

## 2015-05-04 ENCOUNTER — Institutional Professional Consult (permissible substitution) (INDEPENDENT_AMBULATORY_CARE_PROVIDER_SITE_OTHER): Payer: Medicare Other | Admitting: Cardiothoracic Surgery

## 2015-05-04 ENCOUNTER — Other Ambulatory Visit: Payer: Self-pay | Admitting: *Deleted

## 2015-05-04 ENCOUNTER — Encounter: Payer: Self-pay | Admitting: Cardiothoracic Surgery

## 2015-05-04 VITALS — BP 145/85 | HR 65 | Resp 16 | Ht 73.0 in | Wt 217.0 lb

## 2015-05-04 DIAGNOSIS — I2510 Atherosclerotic heart disease of native coronary artery without angina pectoris: Secondary | ICD-10-CM

## 2015-05-04 NOTE — Progress Notes (Signed)
PCP is SPARKS,JEFFREY D, MD Referring Provider is Antonieta Iba, MD      Chief Complaint  Patient presents with  . Coronary Artery Disease    eval for CABG...cathed 04/30/15 after positive stress test  . Shortness of Breath        [address]       [place] [address]             [phone]        [name] Medical Record #[id number] Date of Birth: 1941/06/08  Referring: Antonieta Iba, MD Primary Care: Marguarite Arbour, MD  Chief Complaint:    Chief Complaint  Patient presents with  . Coronary Artery Disease    eval for CABG...cathed 04/30/15 after positive stress test  . Shortness of Breath   patient examined, coronary angiograms performed last week personally reviewed and discussed with patient and family  History of Present Illness:     74 year old Caucasian male with hypertension, hyperlipidemia, and previous history of CAD with stents placed in the LAD and RCA in 2001, 2005. The patient has for several years followed a fairly disciplined walking program on a treadmill, 5 miles per session 5 days a week. Over the past few weeks he has had shortness of breath with exertion. He was evaluated by pulmonary--there is no history of smoking and apparently PFTs were satisfactory. He underwent a stress test followed by a myocardial perfusion scan which showed anterior wall ischemia. 5 days ago he underwent cardiac catheterization by Dr Mariah Milling which demonstrated severe three-vessel CAD with 80-90 percent stenoses in all major territories. LV systolic function was well preserved and LV EDP was normal. The patient was not felt to be candidate for further percutaneous intervention and multivessel CABG was recommended.  The patient had been on Plavix for several years but was stopped proximally 6 days ago for the cardiac catheterization. He did have some bleeding from his right groin with ecchymoses and a palpable but small hematoma. He is not  resumed his Plavix.  The patient denies history of smoking or alcohol consumption. 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 Patient has positive family history of coronary disease in his father and uncles and brother. He lives alone with his daughter close by.   Current Activity/ Functional Status: The patient has no difficulty with his ADLs and was working running his business up until last week.   Zubrod Score: At the time of surgery this patient's most appropriate activity status/level should be described as: []     0    Normal activity, no symptoms [x]     1    Restricted in physical strenuous activity but ambulatory, able to do out light work []     2    Ambulatory and capable of self care, unable to do work activities, up and about                 more than 50%  Of the time                            []     3    Only limited self care, in bed greater than 50% of waking hours []     4    Completely disabled, no self care, confined to bed or chair []     5    Moribund  Past Medical History  Diagnosis Date  . CAD (  coronary artery disease)     unspecified site. s/p DES to LAD and RCA  . Hyperlipidemia, mixed   . HTN (hypertension)   . ED (erectile dysfunction) of non-organic origin   . Myocardial infarction [facility](HCC) 2000    Past Surgical History  Procedure Laterality Date  . Coronary angioplasty      3 stents  . Back surgery      2 lunbar surgeries  . Cardiac catheterization N/A 04/30/2015    Procedure: Left Heart Cath;  Surgeon: Antonieta Ibaimothy J Gollan, MD;  Location: [facility] INVASIVE CV LAB;  Service: Cardiovascular;  Laterality: N/A;    History  Smoking status  . Never Smoker   Smokeless tobacco  . Not on file    Comment: tobacco use- no   History  Alcohol Use No    Social History   Social History  . Marital Status: Widowed    Spouse Name: N/A  . Number of Children: N/A  . Years of Education: N/A   Occupational History  . OWNER     Full time    Social History Main Topics  . Smoking status: Never Smoker   . Smokeless tobacco: Not on file     Comment: tobacco use- no  . Alcohol Use: No  . Drug Use: No  . Sexual Activity: Not on file   Other Topics Concern  . Not on file   Social History Narrative   Full time. Married. Gets regular exercise.     Allergies  Allergen Reactions  . Codeine     REACTION: Vomiting    Current Outpatient Prescriptions  Medication Sig Dispense Refill  . aspirin 81 MG tablet Take 81 mg by mouth daily.      . clopidogrel (PLAVIX) 75 MG tablet Take 1 tablet (75 mg total) by mouth daily. 90 tablet 3  . fexofenadine (ALLEGRA) 180 MG tablet Take 180 mg by mouth 2 (two) times a week.    Marland Kitchen. Fexofenadine HCl (ALLEGRA PO) Take by mouth as needed.    . metoprolol succinate (TOPROL-XL) 50 MG 24 hr tablet Take 1 tablet (50 mg total) by mouth daily. 90 tablet 3  . Multiple Vitamins-Minerals (CENTRUM PO) Take by mouth daily.      . nitroGLYCERIN (NITROSTAT) 0.4 MG SL tablet Place 1 tablet (0.4 mg total) under the tongue every 5 (five) minutes as needed for chest pain. 25 tablet 3  . Omega-3 Fatty Acids (FISH OIL) 1000 MG CAPS Take 1,000 mg by mouth 2 (two) times daily.    . simvastatin (ZOCOR) 40 MG tablet Take 1 tablet (40 mg total) by mouth at bedtime. 90 tablet 3   No current facility-administered medications for this visit.     (Not in a hospital admission)  Family History  Problem Relation Age of Onset  . Family history unknown: Yes     Review of Systems:       Cardiac Review of Systems: Y or N  Chest Pain [no   ]  Resting SOB [  no ] Exertional SOB  [ yes ]  Orthopnea [no  ]   Pedal Edema [  no ]    Palpitations [ yes ] Syncope  [  ]   Presyncope [no   ]  General Review of Systems: [Y] = yes [  ]=no Constitional: recent weight change [  ]; anorexia [  ]; fatigue [  ]; nausea [  ]; night sweats [  ]; fever [  ]; or chills [  ]  Dental: poor dentition[  ]; Last Dentist visit: Every 6 months  Eye : blurred vision [  ]; diplopia [   ]; vision changes [  ];  Amaurosis fugax[  ]; Resp: cough [  ];  wheezing[  ];  hemoptysis[  ]; shortness of breath[ yes ]; paroxysmal nocturnal dyspnea[  ]; dyspnea on exertion[yes  ]; or orthopnea[  ];  GI:  gallstones[  ], vomiting[  ];  dysphagia[  ]; melena[  ];  hematochezia [  ]; heartburn[  ];   Hx of  Colonoscopy[  ]; GU: kidney stones [  ]; hematuria[  ];   dysuria [  ];  nocturia[  ];  history of     obstruction [  ]; urinary frequency [  ]             Skin: rash, swelling[  ];, hair loss[  ];  peripheral edema[  ];  or itching[  ]; Musculosketetal: myalgias[  ];  joint swelling[  ];  joint erythema[  ];  joint pain[  ];  back pain[  ];  Heme/Lymph: bruising[yes right thigh following cardiac cath  ];  bleeding[  ];  anemia[  ];  Neuro: TIA[  ];  headaches[  ];  stroke[  ];  vertigo[  ];  seizures[  ];   paresthesias[  ];  difficulty walking[  ];  Psych:depression[  ]; anxiety[  ];  Endocrine: diabetes[ no ];  thyroid dysfunction[  ];  Immunizations: Flu [  ]; Pneumococcal[  ];  Other: Patient is right-hand dominant, he denies previous thoracic trauma. Previous surgical history positive for lumbar back surgery, laminectomy.  Last hospitalization was 2015 when he had a viral-induced gastroenteritis with severe diarrhea and dehydration.  Physical Exam: BP 145/85 mmHg  Pulse 65  Resp 16  Ht  (1.854 m)  Wt 217 lb (98.431 kg)  BMI 28.64 kg/m2  SpO2 97%       Physical Exam  General: Well-nourished 74 year old Caucasian male who appears fairly strong in no distress HEENT: Normocephalic pupils equal , dentition adequate Neck: Supple without JVD, adenopathy, or bruit Chest: Clear to auscultation, symmetrical breath sounds, no rhonchi, no tenderness             or deformity Cardiovascular: Regular rate and rhythm, no murmur, no gallop, peripheral pulses              palpable in all extremities Abdomen:  Soft, nontender, no palpable mass or organomegaly Extremities: Warm, well-perfused, no clubbing cyanosis edema or tenderness,              no venous stasis changes of the legs mild varicosities which are superficial left greater than right.    Ecchymoses in the right thigh with small palpable hematoma at the cardiac cath site Rectal/GU: Deferred Neuro: Grossly non--focal and symmetrical throughout Skin: Clean and dry without rash or ulceration   Diagnostic Studies & Laboratory data:     Recent Radiology Findings:   No results found.   I have independently reviewed the above radiologic studies. Cardiac catheterization, previous CT scan of the abdomen, latest chest x-ray  Recent Lab Findings: Lab Results  Component Value Date   WBC 5.3 04/27/2015   HGB 12.6* 04/17/2014   HCT 38.9 04/27/2015   PLT 181 04/17/2014   GLUCOSE 105* 04/27/2015   ALT 15 04/16/2014   AST 18 04/16/2014   NA 142 04/27/2015   K 4.1 04/27/2015   CL 105 04/27/2015  CREATININE 0.94 04/27/2015   BUN 16 04/27/2015   CO2 22 04/27/2015   INR 1.0 04/27/2015      Assessment / Plan:     Severe three-vessel coronary disease, positive stress test/Cardiolite scan    Class III anginal equivalent symptoms of dyspnea  I agree that multivessel CABG is the best treatment for this patient has severe three-vessel disease with increasing symptoms who would be at high risk for an acute MI. I've recommended CABG to the patient in order to preserve LV function, improve symptoms and  exercise tolerance, and improve survival.    @ 05/04/2015 4:11 PM       consult  HPPeter [name] III, MD [facility] [phone]

## 2015-05-06 ENCOUNTER — Telehealth: Payer: Self-pay | Admitting: Cardiovascular Disease

## 2015-05-06 ENCOUNTER — Ambulatory Visit (HOSPITAL_COMMUNITY)
Admission: RE | Admit: 2015-05-06 | Discharge: 2015-05-06 | Disposition: A | Discharge status: Home / Self Care | Payer: Medicare Other | Source: Ambulatory Visit | Attending: Cardiothoracic Surgery | Admitting: Cardiothoracic Surgery

## 2015-05-06 ENCOUNTER — Encounter (HOSPITAL_COMMUNITY): Payer: Self-pay

## 2015-05-06 ENCOUNTER — Encounter (HOSPITAL_COMMUNITY)
Admission: RE | Admit: 2015-05-06 | Discharge: 2015-05-06 | Disposition: A | Discharge status: Home / Self Care | Payer: Medicare Other | Source: Ambulatory Visit | Attending: Cardiothoracic Surgery | Admitting: Cardiothoracic Surgery

## 2015-05-06 VITALS — BP 129/81 | HR 65 | Temp 97.4°F | Resp 18 | Wt 219.6 lb

## 2015-05-06 DIAGNOSIS — I2510 Atherosclerotic heart disease of native coronary artery without angina pectoris: Secondary | ICD-10-CM

## 2015-05-06 DIAGNOSIS — Z8701 Personal history of pneumonia (recurrent): Secondary | ICD-10-CM | POA: Diagnosis not present

## 2015-05-06 DIAGNOSIS — I10 Essential (primary) hypertension: Secondary | ICD-10-CM | POA: Insufficient documentation

## 2015-05-06 DIAGNOSIS — I6523 Occlusion and stenosis of bilateral carotid arteries: Secondary | ICD-10-CM | POA: Insufficient documentation

## 2015-05-06 DIAGNOSIS — Z0183 Encounter for blood typing: Secondary | ICD-10-CM | POA: Insufficient documentation

## 2015-05-06 DIAGNOSIS — I440 Atrioventricular block, first degree: Secondary | ICD-10-CM | POA: Diagnosis not present

## 2015-05-06 DIAGNOSIS — Z01812 Encounter for preprocedural laboratory examination: Secondary | ICD-10-CM | POA: Insufficient documentation

## 2015-05-06 DIAGNOSIS — Z01818 Encounter for other preprocedural examination: Secondary | ICD-10-CM | POA: Diagnosis present

## 2015-05-06 HISTORY — DX: Calculus of kidney: N20.0

## 2015-05-06 HISTORY — DX: Unspecified osteoarthritis, unspecified site: M19.90

## 2015-05-06 HISTORY — DX: Cramp and spasm: R25.2

## 2015-05-06 LAB — BLOOD GAS, ARTERIAL
Acid-base deficit: 0.3 mmol/L (ref 0.0–2.0)
Bicarbonate: 23.4 mEq/L (ref 20.0–24.0)
Drawn by: 42180
FIO2: 0.21
O2 Saturation: 97 %
Patient temperature: 98.6
TCO2: 24.5 mmol/L (ref 0–100)
pCO2 arterial: 35.7 mmHg (ref 35.0–45.0)
pH, Arterial: 7.432 (ref 7.350–7.450)
pO2, Arterial: 93.3 mmHg (ref 80.0–100.0)

## 2015-05-06 LAB — COMPREHENSIVE METABOLIC PANEL
ALT: 13 U/L — ABNORMAL LOW (ref 17–63)
AST: 17 U/L (ref 15–41)
Albumin: 4 g/dL (ref 3.5–5.0)
Alkaline Phosphatase: 65 U/L (ref 38–126)
Anion gap: 10 (ref 5–15)
BUN: 17 mg/dL (ref 6–20)
CO2: 23 mmol/L (ref 22–32)
Calcium: 9.2 mg/dL (ref 8.9–10.3)
Chloride: 107 mmol/L (ref 101–111)
Creatinine, Ser: 1.04 mg/dL (ref 0.61–1.24)
GFR calc Af Amer: >60 mL/min (ref >60)
GFR calc non Af Amer: >60 mL/min (ref >60)
Glucose, Bld: 102 mg/dL — ABNORMAL HIGH (ref 65–99)
Potassium: 4 mmol/L (ref 3.5–5.1)
Sodium: 140 mmol/L (ref 135–145)
Total Bilirubin: 1.2 mg/dL (ref 0.3–1.2)
Total Protein: 6.4 g/dL — ABNORMAL LOW (ref 6.5–8.1)

## 2015-05-06 LAB — PULMONARY FUNCTION TEST
DL/VA % pred: 86 %
DL/VA: 3.91 ml/min/mmHg/L
DLCO cor % pred: 87 %
DLCO cor: 27.08 ml/min/mmHg
DLCO unc % pred: 82 %
DLCO unc: 25.68 ml/min/mmHg
FEF 25-75 Post: 4.05 L/sec
FEF 25-75 Pre: 4.28 L/sec
FEF2575-%Change-Post: -5 %
FEF2575-%Pred-Post: 193 %
FEF2575-%Pred-Pre: 204 %
FEV1-%Change-Post: -1 %
FEV1-%Pred-Post: 132 %
FEV1-%Pred-Pre: 135 %
FEV1-Post: 3.86 L
FEV1-Pre: 3.94 L
FEV1FVC-%Change-Post: 1 %
FEV1FVC-%Pred-Pre: 112 %
FEV6-%Change-Post: -3 %
FEV6-%Pred-Post: 124 %
FEV6-%Pred-Pre: 128 %
FEV6-Post: 4.71 L
FEV6-Pre: 4.87 L
FEV6FVC-%Change-Post: 0 %
FEV6FVC-%Pred-Post: 106 %
FEV6FVC-%Pred-Pre: 106 %
FVC-%Change-Post: -3 %
FVC-%Pred-Post: 116 %
FVC-%Pred-Pre: 120 %
FVC-Post: 4.71 L
FVC-Pre: 4.87 L
Post FEV1/FVC ratio: 82 %
Post FEV6/FVC ratio: 100 %
Pre FEV1/FVC ratio: 81 %
Pre FEV6/FVC Ratio: 100 %
RV % pred: 73 %
RV: 1.85 L
TLC % pred: 100 %
TLC: 6.84 L

## 2015-05-06 LAB — CBC
HCT: 38.3 % — ABNORMAL LOW (ref 39.0–52.0)
Hemoglobin: 12.9 g/dL — ABNORMAL LOW (ref 13.0–17.0)
MCH: 31.9 pg (ref 26.0–34.0)
MCHC: 33.7 g/dL (ref 30.0–36.0)
MCV: 94.8 fL (ref 78.0–100.0)
Platelets: 151 10*3/uL (ref 150–400)
RBC: 4.04 MIL/uL — ABNORMAL LOW (ref 4.22–5.81)
RDW: 13 % (ref 11.5–15.5)
WBC: 5.8 10*3/uL (ref 4.0–10.5)

## 2015-05-06 LAB — URINALYSIS, ROUTINE W REFLEX MICROSCOPIC
Bilirubin Urine: NEGATIVE
Glucose, UA: NEGATIVE mg/dL
Hgb urine dipstick: NEGATIVE
Ketones, ur: NEGATIVE mg/dL
Leukocytes, UA: NEGATIVE
Nitrite: NEGATIVE
Protein, ur: NEGATIVE mg/dL
Specific Gravity, Urine: 1.013 (ref 1.005–1.030)
Urobilinogen, UA: 1 mg/dL (ref 0.0–1.0)
pH: 6.5 (ref 5.0–8.0)

## 2015-05-06 LAB — PROTIME-INR
INR: 1.09 (ref 0.00–1.49)
Prothrombin Time: 14.3 seconds (ref 11.6–15.2)

## 2015-05-06 LAB — SURGICAL PCR SCREEN
MRSA, PCR: NEGATIVE
Staphylococcus aureus: POSITIVE — AB

## 2015-05-06 LAB — ABO/RH: ABO/RH(D): A POS

## 2015-05-06 LAB — APTT: aPTT: 27 seconds (ref 24–37)

## 2015-05-06 MED ORDER — ALBUTEROL SULFATE (2.5 MG/3ML) 0.083% IN NEBU
2.5000 mg | INHALATION_SOLUTION | Freq: Once | RESPIRATORY_TRACT | Status: AC
  Administered 2015-05-06: 2.5 mg via RESPIRATORY_TRACT

## 2015-05-06 NOTE — Telephone Encounter (Signed)
Spoke w/ Ryan @ Dr. Zenaida NieceVan Trigt's office.  They received a call from pre-op regarding pt's hematoma and bruising from cath site. Dr. Donata ClayVan Trigt states that pt is cleared from his perspective to continue, but requests that Dr. Mariah MillingGollan take a look and make sure that he is in agreement.  Pt is sched for CABG on Monday.

## 2015-05-06 NOTE — Pre-Procedure Instructions (Signed)
[name]  05/06/2015      [facility] - [place], [place] - [address] [place] [address] Phone: [phone] Fax: [phone]  [id number] - [place], [place] - [address] [place] [address] Phone: [phone] Fax: [phone]  [id number] - [place],  - [address] [place] [address] Phone: [phone] Fax: [phone]    Your procedure is scheduled on Oct. 31  Report to [facility] Admitting at [phone].  Call this number if you have problems the morning of surgery:  [phone]   Remember:  Do not eat food or drink liquids after midnight.  Take these medicines the morning of surgery with A SIP OF WATER Fexofenadine (Allegra) if needed, Metoprolol succinate (Toprol-XL), Nitro if needed Stop taking Plavix and Aspirin as directed by your Dr.  Stop taking BC's, Goody's, Herbal medications, Fish Oil, Aleve, Ibuprofen   Do not wear jewelry, make-up or nail polish.  Do not wear lotions, powders, or perfumes.  You may wear deodorant.  Do not shave 48 hours prior to surgery.  Men may shave face and neck.  Do not bring valuables to the hospital.  [facility] is not responsible for any belongings or valuables.  Contacts, dentures or bridgework may not be worn into surgery.  Leave your suitcase in the car.  After surgery it may be brought to your room.  For patients admitted to the hospital, discharge time will be determined by your treatment team.  Patients discharged the day of surgery will not be allowed to drive home.    Special instructions:  [place] - Preparing for Surgery  Before surgery, you can play an important role.  Because skin is not sterile, your skin needs to be as free of germs as possible.  You can reduce the number of germs on you skin by washing with CHG (chlorahexidine gluconate) soap before surgery.  CHG is an antiseptic cleaner which kills  germs and bonds with the skin to continue killing germs even after washing.  Please DO NOT use if you have an allergy to CHG or antibacterial soaps.  If your skin becomes reddened/irritated stop using the CHG and inform your nurse when you arrive at Short Stay.  Do not shave (including legs and underarms) for at least 48 hours prior to the first CHG shower.  You may shave your face.  Please follow these instructions carefully:   1.  Shower with CHG Soap the night before surgery and the   morning of Surgery.  2.  If you choose to wash your hair, wash your hair first as usual with your   normal shampoo.  3.  After you shampoo, rinse your hair and body thoroughly to remove the   Shampoo.  4.  Use CHG as you would any other liquid soap.  You can apply chg directly  to the skin and wash gently with scrungie or a clean washcloth.  5.  Apply the CHG Soap to your body ONLY FROM THE NECK DOWN.   Do not use on open wounds or open sores.  Avoid contact with your eyes,   ears, mouth and genitals (private parts).  Wash genitals (private parts)  with your normal soap.  6.  Wash thoroughly, paying special attention to the area where your surgery  will be performed.  7.  Thoroughly rinse your body with warm water  from the neck down.  8.  DO NOT shower/wash with your normal soap after using and rinsing off   the CHG Soap.  9.  Pat yourself dry with a clean towel.            10.  Wear clean pajamas.            11.  Place clean sheets on your bed the night of your first shower and do not   sleep with pets.  Day of Surgery  Do not apply any lotions/deoderants the morning of surgery.  Please wear clean clothes to the hospital/surgery center.     Please read over the following fact sheets that you were given. Pain Booklet, Coughing and Deep Breathing, Blood Transfusion Information, Open Heart Packet, Total Joint Packet, MRSA Information and Surgical Site Infection Prevention

## 2015-05-06 NOTE — Progress Notes (Signed)
Pt reports he had heart cath done and bruising is noted at site and it now bruised almost to his knee. This is further down the thigh since Dr Donata ClayVan Trigt saw him. Message left with Alycia RossettiRyan to let Dr Donata ClayVan Trigt know

## 2015-05-06 NOTE — Telephone Encounter (Signed)
[name] called back to let us know that Dr. Donata ClayVan Trigt documented hematoma in his note, but pre-op states that it has spread and wanted to make Dr. Mariah MillingGollan aware.

## 2015-05-06 NOTE — Telephone Encounter (Signed)
Spoke w/ pt's daughter.  Advised her that I spoke w/ Dr. Mariah MillingGollan and he asks if pt can come over tomorrow for a nurse visit and that Dr. Mariah MillingGollan will take a look at pt's leg.  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 She reports that pre-op did not see the cath site, that he just mentioned that the bruising had spread. She states that neither she nor pt are worried about it, they just wanted to make sure that everyone is aware and she declines appt to have his leg check out.  Advised her to have pt call me in the morning if he feels that he would like to have it looked at, even just for peace of mind. She is appreciative of the call and will let us know if pt changes his mind.

## 2015-05-06 NOTE — Progress Notes (Addendum)
Positive results for MSSA called pt grand daughter and message left for him to pick up the script for Mupirocin. Message also left on his home number.  Script [facility].

## 2015-05-06 NOTE — Telephone Encounter (Signed)
[facility] and daughter has concerns about Hematoma on R leg Started at Cath site and has spread down past knee on inside of thigh Says Donata ClayVan Trigt saw on Tuesday at 2 pm but has not seen it since.  Wants everyone to be aware that this is going on and wants to be sure he ok for surgery .    Douglas BumpRyan Williamson is Nurse at  [phone] please call if need clarification.    Daughter wants to talk to [facility] or [name]

## 2015-05-06 NOTE — Progress Notes (Signed)
PCP is Dr. Aram BeechamJeffrey Sparks Cardiologist is Dr. Dossie Arbourim Gollan Reports he was told to stop taking Plavix 04-29-15

## 2015-05-06 NOTE — Progress Notes (Signed)
Pre-op Cardiac Surgery  Carotid Findings:  1-39% ICA stenosis.  Vertebral artery flow is antegrade.   Upper Extremity Right Left  Brachial Pressures 151T 153T  Radial Waveforms T T  Ulnar Waveforms T T  Palmar Arch (Allen's Test) WNL Doppler signal obliterates with radial compression and remains normal with ulnar compression   Findings:      Lower  Extremity Right Left  Dorsalis Pedis    Anterior Tibial    Posterior Tibial    Ankle/Brachial Indices      Findings:  Palpable

## 2015-05-07 LAB — HEMOGLOBIN A1C
Hgb A1c MFr Bld: 5.6 % (ref 4.8–5.6)
Mean Plasma Glucose: 114 mg/dL

## 2015-05-09 MED ORDER — MAGNESIUM SULFATE 50 % IJ SOLN
40.0000 meq | INTRAMUSCULAR | Status: DC
  Filled 2015-05-09: qty 10

## 2015-05-09 MED ORDER — SODIUM CHLORIDE 0.9 % IV SOLN
INTRAVENOUS | Status: AC
  Administered 2015-05-10: 69 mL/h via INTRAVENOUS
  Filled 2015-05-09: qty 40

## 2015-05-09 MED ORDER — POTASSIUM CHLORIDE 2 MEQ/ML IV SOLN
80.0000 meq | INTRAVENOUS | Status: DC
  Filled 2015-05-09: qty 40

## 2015-05-09 MED ORDER — VANCOMYCIN HCL 10 G IV SOLR
1500.0000 mg | INTRAVENOUS | Status: AC
  Administered 2015-05-10: 1500 mg via INTRAVENOUS
  Filled 2015-05-09: qty 1500

## 2015-05-09 MED ORDER — DEXTROSE 5 % IV SOLN
1.5000 g | INTRAVENOUS | Status: AC
  Administered 2015-05-10: .75 g via INTRAVENOUS
  Administered 2015-05-10: 1.5 g via INTRAVENOUS
  Filled 2015-05-09 (×2): qty 1.5

## 2015-05-09 MED ORDER — PHENYLEPHRINE HCL 10 MG/ML IJ SOLN
30.0000 ug/min | INTRAVENOUS | Status: AC
  Administered 2015-05-10: 20 ug/min via INTRAVENOUS
  Filled 2015-05-09: qty 2

## 2015-05-09 MED ORDER — NITROGLYCERIN IN D5W 200-5 MCG/ML-% IV SOLN
2.0000 ug/min | INTRAVENOUS | Status: AC
  Administered 2015-05-10: 5 ug/min via INTRAVENOUS
  Filled 2015-05-09 (×2): qty 250

## 2015-05-09 MED ORDER — DOPAMINE-DEXTROSE 3.2-5 MG/ML-% IV SOLN
0.0000 ug/kg/min | INTRAVENOUS | Status: AC
Start: 2015-05-10 — End: 2015-05-10
  Administered 2015-05-10: 3 ug/kg/min via INTRAVENOUS
  Filled 2015-05-09: qty 250

## 2015-05-09 MED ORDER — CEFUROXIME SODIUM 750 MG IJ SOLR
750.0000 mg | INTRAMUSCULAR | Status: DC
  Filled 2015-05-09: qty 750

## 2015-05-09 MED ORDER — SODIUM CHLORIDE 0.9 % IV SOLN
INTRAVENOUS | Status: AC
  Administered 2015-05-10: 1 [IU]/h via INTRAVENOUS
  Filled 2015-05-09: qty 2.5

## 2015-05-09 MED ORDER — PLASMA-LYTE 148 IV SOLN
INTRAVENOUS | Status: AC
  Administered 2015-05-10: 500 mL
  Filled 2015-05-09: qty 2.5

## 2015-05-09 MED ORDER — SODIUM CHLORIDE 0.9 % IV SOLN
INTRAVENOUS | Status: DC
  Filled 2015-05-09: qty 30

## 2015-05-09 MED ORDER — DEXMEDETOMIDINE HCL IN NACL 400 MCG/100ML IV SOLN
0.1000 ug/kg/h | INTRAVENOUS | Status: AC
  Administered 2015-05-10: .3 ug/kg/h via INTRAVENOUS
  Filled 2015-05-09: qty 100

## 2015-05-09 MED ORDER — EPINEPHRINE HCL 1 MG/ML IJ SOLN
0.0000 ug/min | INTRAVENOUS | Status: DC
  Filled 2015-05-09: qty 4

## 2015-05-10 ENCOUNTER — Inpatient Hospital Stay (HOSPITAL_COMMUNITY): Payer: Medicare Other | Admitting: Emergency Medicine

## 2015-05-10 ENCOUNTER — Inpatient Hospital Stay (HOSPITAL_COMMUNITY): Payer: Medicare Other

## 2015-05-10 ENCOUNTER — Encounter (HOSPITAL_COMMUNITY): Payer: Self-pay | Admitting: General Practice

## 2015-05-10 ENCOUNTER — Inpatient Hospital Stay (HOSPITAL_COMMUNITY): Payer: Medicare Other | Admitting: Anesthesiology

## 2015-05-10 ENCOUNTER — Inpatient Hospital Stay (HOSPITAL_COMMUNITY)
Admission: AD | Admit: 2015-05-10 | Discharge: 2015-05-15 | DRG: 236 | Disposition: A | Discharge status: Home / Self Care | Payer: Medicare Other | Source: Ambulatory Visit | Attending: Cardiothoracic Surgery | Admitting: Cardiothoracic Surgery

## 2015-05-10 ENCOUNTER — Encounter (HOSPITAL_COMMUNITY)
Admission: AD | Disposition: A | Discharge status: Home / Self Care | Payer: Medicare Other | Source: Ambulatory Visit | Attending: Cardiothoracic Surgery

## 2015-05-10 DIAGNOSIS — E8770 Fluid overload, unspecified: Secondary | ICD-10-CM | POA: Diagnosis not present

## 2015-05-10 DIAGNOSIS — I252 Old myocardial infarction: Secondary | ICD-10-CM | POA: Diagnosis not present

## 2015-05-10 DIAGNOSIS — I25119 Atherosclerotic heart disease of native coronary artery with unspecified angina pectoris: Principal | ICD-10-CM | POA: Diagnosis present

## 2015-05-10 DIAGNOSIS — I440 Atrioventricular block, first degree: Secondary | ICD-10-CM | POA: Diagnosis not present

## 2015-05-10 DIAGNOSIS — I4891 Unspecified atrial fibrillation: Secondary | ICD-10-CM | POA: Diagnosis not present

## 2015-05-10 DIAGNOSIS — I2510 Atherosclerotic heart disease of native coronary artery without angina pectoris: Secondary | ICD-10-CM | POA: Diagnosis present

## 2015-05-10 DIAGNOSIS — D62 Acute posthemorrhagic anemia: Secondary | ICD-10-CM | POA: Diagnosis not present

## 2015-05-10 DIAGNOSIS — D696 Thrombocytopenia, unspecified: Secondary | ICD-10-CM | POA: Diagnosis not present

## 2015-05-10 DIAGNOSIS — Z951 Presence of aortocoronary bypass graft: Secondary | ICD-10-CM

## 2015-05-10 DIAGNOSIS — Z955 Presence of coronary angioplasty implant and graft: Secondary | ICD-10-CM

## 2015-05-10 DIAGNOSIS — J939 Pneumothorax, unspecified: Secondary | ICD-10-CM

## 2015-05-10 DIAGNOSIS — I10 Essential (primary) hypertension: Secondary | ICD-10-CM | POA: Diagnosis present

## 2015-05-10 DIAGNOSIS — E119 Type 2 diabetes mellitus without complications: Secondary | ICD-10-CM | POA: Diagnosis present

## 2015-05-10 DIAGNOSIS — F419 Anxiety disorder, unspecified: Secondary | ICD-10-CM | POA: Diagnosis not present

## 2015-05-10 DIAGNOSIS — E785 Hyperlipidemia, unspecified: Secondary | ICD-10-CM | POA: Diagnosis present

## 2015-05-10 HISTORY — PX: CORONARY ARTERY BYPASS GRAFT: SHX141

## 2015-05-10 HISTORY — PX: TEE WITHOUT CARDIOVERSION: SHX5443

## 2015-05-10 LAB — POCT I-STAT 3, ART BLOOD GAS (G3+)
Acid-Base Excess: 1 mmol/L (ref 0.0–2.0)
Acid-base deficit: 2 mmol/L (ref 0.0–2.0)
Acid-base deficit: 2 mmol/L (ref 0.0–2.0)
Bicarbonate: 27.1 mEq/L — ABNORMAL HIGH (ref 20.0–24.0)
Bicarbonate: 24.4 mEq/L — ABNORMAL HIGH (ref 20.0–24.0)
Bicarbonate: 22.5 mEq/L (ref 20.0–24.0)
Bicarbonate: 24.7 mEq/L — ABNORMAL HIGH (ref 20.0–24.0)
Bicarbonate: 24 mEq/L (ref 20.0–24.0)
O2 Saturation: 100 %
O2 Saturation: 99 %
O2 Saturation: 92 %
O2 Saturation: 97 %
O2 Saturation: 95 %
Patient temperature: 36.1
Patient temperature: 37.6
Patient temperature: 37.8
TCO2: 29 mmol/L (ref 0–100)
TCO2: 26 mmol/L (ref 0–100)
TCO2: 24 mmol/L (ref 0–100)
TCO2: 26 mmol/L (ref 0–100)
TCO2: 25 mmol/L (ref 0–100)
pCO2 arterial: 52.7 mmHg — ABNORMAL HIGH (ref 35.0–45.0)
pCO2 arterial: 39.4 mmHg (ref 35.0–45.0)
pCO2 arterial: 33.5 mmHg — ABNORMAL LOW (ref 35.0–45.0)
pCO2 arterial: 42 mmHg (ref 35.0–45.0)
pCO2 arterial: 44.9 mmHg (ref 35.0–45.0)
pH, Arterial: 7.319 — ABNORMAL LOW (ref 7.350–7.450)
pH, Arterial: 7.401 (ref 7.350–7.450)
pH, Arterial: 7.43 (ref 7.350–7.450)
pH, Arterial: 7.38 (ref 7.350–7.450)
pH, Arterial: 7.34 — ABNORMAL LOW (ref 7.350–7.450)
pO2, Arterial: 370 mmHg — ABNORMAL HIGH (ref 80.0–100.0)
pO2, Arterial: 156 mmHg — ABNORMAL HIGH (ref 80.0–100.0)
pO2, Arterial: 58 mmHg — ABNORMAL LOW (ref 80.0–100.0)
pO2, Arterial: 97 mmHg (ref 80.0–100.0)
pO2, Arterial: 82 mmHg (ref 80.0–100.0)

## 2015-05-10 LAB — POCT I-STAT, CHEM 8
BUN: 22 mg/dL — ABNORMAL HIGH (ref 6–20)
BUN: 18 mg/dL (ref 6–20)
BUN: 20 mg/dL (ref 6–20)
BUN: 19 mg/dL (ref 6–20)
BUN: 18 mg/dL (ref 6–20)
BUN: 17 mg/dL (ref 6–20)
Calcium, Ion: 1.19 mmol/L (ref 1.13–1.30)
Calcium, Ion: 1.02 mmol/L — ABNORMAL LOW (ref 1.13–1.30)
Calcium, Ion: 1.2 mmol/L (ref 1.13–1.30)
Calcium, Ion: 1.08 mmol/L — ABNORMAL LOW (ref 1.13–1.30)
Calcium, Ion: 1.1 mmol/L — ABNORMAL LOW (ref 1.13–1.30)
Calcium, Ion: 1.13 mmol/L (ref 1.13–1.30)
Chloride: 104 mmol/L (ref 101–111)
Chloride: 101 mmol/L (ref 101–111)
Chloride: 102 mmol/L (ref 101–111)
Chloride: 102 mmol/L (ref 101–111)
Chloride: 103 mmol/L (ref 101–111)
Chloride: 106 mmol/L (ref 101–111)
Creatinine, Ser: 0.8 mg/dL (ref 0.61–1.24)
Creatinine, Ser: 0.6 mg/dL — ABNORMAL LOW (ref 0.61–1.24)
Creatinine, Ser: 0.8 mg/dL (ref 0.61–1.24)
Creatinine, Ser: 0.8 mg/dL (ref 0.61–1.24)
Creatinine, Ser: 0.8 mg/dL (ref 0.61–1.24)
Creatinine, Ser: 0.9 mg/dL (ref 0.61–1.24)
Glucose, Bld: 112 mg/dL — ABNORMAL HIGH (ref 65–99)
Glucose, Bld: 120 mg/dL — ABNORMAL HIGH (ref 65–99)
Glucose, Bld: 132 mg/dL — ABNORMAL HIGH (ref 65–99)
Glucose, Bld: 151 mg/dL — ABNORMAL HIGH (ref 65–99)
Glucose, Bld: 144 mg/dL — ABNORMAL HIGH (ref 65–99)
Glucose, Bld: 112 mg/dL — ABNORMAL HIGH (ref 65–99)
HCT: 34 % — ABNORMAL LOW (ref 39.0–52.0)
HCT: 26 % — ABNORMAL LOW (ref 39.0–52.0)
HCT: 31 % — ABNORMAL LOW (ref 39.0–52.0)
HCT: 25 % — ABNORMAL LOW (ref 39.0–52.0)
HCT: 30 % — ABNORMAL LOW (ref 39.0–52.0)
HCT: 28 % — ABNORMAL LOW (ref 39.0–52.0)
Hemoglobin: 11.6 g/dL — ABNORMAL LOW (ref 13.0–17.0)
Hemoglobin: 10.5 g/dL — ABNORMAL LOW (ref 13.0–17.0)
Hemoglobin: 8.8 g/dL — ABNORMAL LOW (ref 13.0–17.0)
Hemoglobin: 8.5 g/dL — ABNORMAL LOW (ref 13.0–17.0)
Hemoglobin: 10.2 g/dL — ABNORMAL LOW (ref 13.0–17.0)
Hemoglobin: 9.5 g/dL — ABNORMAL LOW (ref 13.0–17.0)
Potassium: 3.7 mmol/L (ref 3.5–5.1)
Potassium: 4 mmol/L (ref 3.5–5.1)
Potassium: 4.1 mmol/L (ref 3.5–5.1)
Potassium: 4.2 mmol/L (ref 3.5–5.1)
Potassium: 3.8 mmol/L (ref 3.5–5.1)
Potassium: 4.2 mmol/L (ref 3.5–5.1)
Sodium: 140 mmol/L (ref 135–145)
Sodium: 139 mmol/L (ref 135–145)
Sodium: 140 mmol/L (ref 135–145)
Sodium: 136 mmol/L (ref 135–145)
Sodium: 140 mmol/L (ref 135–145)
Sodium: 141 mmol/L (ref 135–145)
TCO2: 26 mmol/L (ref 0–100)
TCO2: 26 mmol/L (ref 0–100)
TCO2: 27 mmol/L (ref 0–100)
TCO2: 25 mmol/L (ref 0–100)
TCO2: 23 mmol/L (ref 0–100)
TCO2: 24 mmol/L (ref 0–100)

## 2015-05-10 LAB — CBC
HCT: 28.6 % — ABNORMAL LOW (ref 39.0–52.0)
HCT: 29.1 % — ABNORMAL LOW (ref 39.0–52.0)
HEMOGLOBIN: 10.3 g/dL — AB (ref 13.0–17.0)
Hemoglobin: 9.9 g/dL — ABNORMAL LOW (ref 13.0–17.0)
MCH: 33.1 pg (ref 26.0–34.0)
MCH: 31.4 pg (ref 26.0–34.0)
MCHC: 36 g/dL (ref 30.0–36.0)
MCHC: 34 g/dL (ref 30.0–36.0)
MCV: 92 fL (ref 78.0–100.0)
MCV: 92.4 fL (ref 78.0–100.0)
PLATELETS: 112 10*3/uL — AB (ref 150–400)
Platelets: 159 10*3/uL (ref 150–400)
RBC: 3.11 MIL/uL — AB (ref 4.22–5.81)
RBC: 3.15 MIL/uL — ABNORMAL LOW (ref 4.22–5.81)
RDW: 13 % (ref 11.5–15.5)
RDW: 13.1 % (ref 11.5–15.5)
WBC: 8.3 10*3/uL (ref 4.0–10.5)
WBC: 9.3 10*3/uL (ref 4.0–10.5)

## 2015-05-10 LAB — APTT: aPTT: 33 seconds (ref 24–37)

## 2015-05-10 LAB — POCT I-STAT 4, (NA,K, GLUC, HGB,HCT)
Glucose, Bld: 129 mg/dL — ABNORMAL HIGH (ref 65–99)
HCT: 29 % — ABNORMAL LOW (ref 39.0–52.0)
Hemoglobin: 9.9 g/dL — ABNORMAL LOW (ref 13.0–17.0)
Potassium: 3.3 mmol/L — ABNORMAL LOW (ref 3.5–5.1)
Sodium: 142 mmol/L (ref 135–145)

## 2015-05-10 LAB — MAGNESIUM: Magnesium: 2.7 mg/dL — ABNORMAL HIGH (ref 1.7–2.4)

## 2015-05-10 LAB — HEMOGLOBIN AND HEMATOCRIT, BLOOD
HCT: 25.5 % — ABNORMAL LOW (ref 39.0–52.0)
Hemoglobin: 8.8 g/dL — ABNORMAL LOW (ref 13.0–17.0)

## 2015-05-10 LAB — PROTIME-INR
INR: 1.43 (ref 0.00–1.49)
PROTHROMBIN TIME: 17.5 s — AB (ref 11.6–15.2)

## 2015-05-10 LAB — GLUCOSE, CAPILLARY
Glucose-Capillary: 133 mg/dL — ABNORMAL HIGH (ref 65–99)
Glucose-Capillary: 112 mg/dL — ABNORMAL HIGH (ref 65–99)
Glucose-Capillary: 110 mg/dL — ABNORMAL HIGH (ref 65–99)
Glucose-Capillary: 132 mg/dL — ABNORMAL HIGH (ref 65–99)

## 2015-05-10 LAB — PLATELET COUNT: Platelets: 118 10*3/uL — ABNORMAL LOW (ref 150–400)

## 2015-05-10 LAB — CREATININE, SERUM
Creatinine, Ser: 0.88 mg/dL (ref 0.61–1.24)
GFR calc Af Amer: >60 mL/min (ref >60)
GFR calc non Af Amer: >60 mL/min (ref >60)

## 2015-05-10 SURGERY — CORONARY ARTERY BYPASS GRAFTING (CABG)
Anesthesia: General | Site: Chest

## 2015-05-10 MED ORDER — ARTIFICIAL TEARS OP OINT
TOPICAL_OINTMENT | OPHTHALMIC | Status: DC | PRN
  Administered 2015-05-10: 1 via OPHTHALMIC

## 2015-05-10 MED ORDER — FENTANYL CITRATE (PF) 250 MCG/5ML IJ SOLN
INTRAMUSCULAR | Status: AC
  Filled 2015-05-10: qty 5

## 2015-05-10 MED ORDER — ROCURONIUM BROMIDE 100 MG/10ML IV SOLN
INTRAVENOUS | Status: DC | PRN
  Administered 2015-05-10 (×4): 50 mg via INTRAVENOUS

## 2015-05-10 MED ORDER — LIDOCAINE HCL (CARDIAC) 20 MG/ML IV SOLN
INTRAVENOUS | Status: AC
  Filled 2015-05-10: qty 5

## 2015-05-10 MED ORDER — LACTATED RINGERS IV SOLN
INTRAVENOUS | Status: DC
Start: 2015-05-10 — End: 2015-05-15

## 2015-05-10 MED ORDER — INSULIN REGULAR BOLUS VIA INFUSION
0.0000 [IU] | Freq: Three times a day (TID) | INTRAVENOUS | Status: DC
  Filled 2015-05-10: qty 10

## 2015-05-10 MED ORDER — 0.9 % SODIUM CHLORIDE (POUR BTL) OPTIME
TOPICAL | Status: DC | PRN
  Administered 2015-05-10: 6000 mL

## 2015-05-10 MED ORDER — PNEUMOCOCCAL VAC POLYVALENT 25 MCG/0.5ML IJ INJ
0.5000 mL | INJECTION | INTRAMUSCULAR | Status: DC
  Filled 2015-05-10: qty 0.5

## 2015-05-10 MED ORDER — MIDAZOLAM HCL 10 MG/2ML IJ SOLN
INTRAMUSCULAR | Status: AC
  Filled 2015-05-10: qty 4

## 2015-05-10 MED ORDER — METOPROLOL TARTRATE 1 MG/ML IV SOLN: 2.5000 mg | INTRAVENOUS | Status: DC | PRN

## 2015-05-10 MED ORDER — PROPOFOL 10 MG/ML IV BOLUS
INTRAVENOUS | Status: DC | PRN
  Administered 2015-05-10: 60 mg via INTRAVENOUS
  Administered 2015-05-10: 50 mg via INTRAVENOUS

## 2015-05-10 MED ORDER — VANCOMYCIN HCL IN DEXTROSE 1-5 GM/200ML-% IV SOLN
1000.0000 mg | Freq: Once | INTRAVENOUS | Status: DC
  Filled 2015-05-10: qty 200

## 2015-05-10 MED ORDER — DOPAMINE-DEXTROSE 3.2-5 MG/ML-% IV SOLN: 0.0000 ug/kg/min | INTRAVENOUS | Status: DC

## 2015-05-10 MED ORDER — SODIUM CHLORIDE 0.45 % IV SOLN: INTRAVENOUS | Status: DC | PRN

## 2015-05-10 MED ORDER — POTASSIUM CHLORIDE 10 MEQ/50ML IV SOLN
10.0000 meq | INTRAVENOUS | Status: AC
  Administered 2015-05-10 (×2): 10 meq via INTRAVENOUS
  Filled 2015-05-10: qty 50

## 2015-05-10 MED ORDER — PROPOFOL 10 MG/ML IV BOLUS
INTRAVENOUS | Status: AC
  Filled 2015-05-10: qty 20

## 2015-05-10 MED ORDER — FAMOTIDINE IN NACL 20-0.9 MG/50ML-% IV SOLN
20.0000 mg | Freq: Two times a day (BID) | INTRAVENOUS | Status: DC
  Administered 2015-05-10: 20 mg via INTRAVENOUS

## 2015-05-10 MED ORDER — CHLORHEXIDINE GLUCONATE 4 % EX LIQD: 30.0000 mL | CUTANEOUS | Status: DC

## 2015-05-10 MED ORDER — BISACODYL 10 MG RE SUPP
10.0000 mg | Freq: Every day | RECTAL | Status: DC
  Filled 2015-05-10: qty 1

## 2015-05-10 MED ORDER — ARTIFICIAL TEARS OP OINT
TOPICAL_OINTMENT | OPHTHALMIC | Status: AC
  Filled 2015-05-10: qty 3.5

## 2015-05-10 MED ORDER — EPHEDRINE SULFATE 50 MG/ML IJ SOLN
INTRAMUSCULAR | Status: AC
  Filled 2015-05-10: qty 1

## 2015-05-10 MED ORDER — TRAMADOL HCL 50 MG PO TABS
50.0000 mg | ORAL_TABLET | ORAL | Status: DC | PRN
  Administered 2015-05-12 – 2015-05-15 (×8): 100 mg via ORAL
  Filled 2015-05-10 (×8): qty 2

## 2015-05-10 MED ORDER — SODIUM CHLORIDE 0.9 % IJ SOLN
INTRAMUSCULAR | Status: AC
  Filled 2015-05-10: qty 10

## 2015-05-10 MED ORDER — FISH OIL 1000 MG PO CAPS: 1000.0000 mg | ORAL_CAPSULE | Freq: Two times a day (BID) | ORAL | Status: DC

## 2015-05-10 MED ORDER — MUPIROCIN 2 % EX OINT
1.0000 "application " | TOPICAL_OINTMENT | Freq: Two times a day (BID) | CUTANEOUS | Status: AC
  Administered 2015-05-10 – 2015-05-15 (×10): 1 via NASAL
  Filled 2015-05-10 (×2): qty 22

## 2015-05-10 MED ORDER — METOPROLOL TARTRATE 12.5 MG HALF TABLET
12.5000 mg | ORAL_TABLET | Freq: Two times a day (BID) | ORAL | Status: DC
  Administered 2015-05-12 – 2015-05-15 (×7): 12.5 mg via ORAL
  Filled 2015-05-10 (×10): qty 1

## 2015-05-10 MED ORDER — HEPARIN SODIUM (PORCINE) 1000 UNIT/ML IJ SOLN
INTRAMUSCULAR | Status: DC | PRN
  Administered 2015-05-10 (×2): 2000 [IU] via INTRAVENOUS
  Administered 2015-05-10: 26000 [IU] via INTRAVENOUS

## 2015-05-10 MED ORDER — MIDAZOLAM HCL 5 MG/5ML IJ SOLN
INTRAMUSCULAR | Status: DC | PRN
  Administered 2015-05-10: 2 mg via INTRAVENOUS
  Administered 2015-05-10: 3 mg via INTRAVENOUS
  Administered 2015-05-10: 5 mg via INTRAVENOUS

## 2015-05-10 MED ORDER — HEMOSTATIC AGENTS (NO CHARGE) OPTIME
TOPICAL | Status: DC | PRN
  Administered 2015-05-10 (×2): 1 via TOPICAL

## 2015-05-10 MED ORDER — CHLORHEXIDINE GLUCONATE 0.12 % MT SOLN
15.0000 mL | OROMUCOSAL | Status: AC
  Administered 2015-05-10: 15 mL via OROMUCOSAL

## 2015-05-10 MED ORDER — ALBUMIN HUMAN 5 % IV SOLN
250.0000 mL | INTRAVENOUS | Status: AC | PRN
  Administered 2015-05-11: 250 mL via INTRAVENOUS

## 2015-05-10 MED ORDER — MAGNESIUM SULFATE 4 GM/100ML IV SOLN
4.0000 g | Freq: Once | INTRAVENOUS | Status: AC
  Administered 2015-05-10: 4 g via INTRAVENOUS
  Filled 2015-05-10: qty 100

## 2015-05-10 MED ORDER — CHLORHEXIDINE GLUCONATE 0.12 % MT SOLN
15.0000 mL | Freq: Once | OROMUCOSAL | Status: AC
  Administered 2015-05-10: 15 mL via OROMUCOSAL
  Filled 2015-05-10: qty 15

## 2015-05-10 MED ORDER — MORPHINE SULFATE (PF) 2 MG/ML IV SOLN
1.0000 mg | INTRAVENOUS | Status: AC | PRN
  Administered 2015-05-11: 2 mg via INTRAVENOUS

## 2015-05-10 MED ORDER — POTASSIUM CHLORIDE 10 MEQ/50ML IV SOLN
10.0000 meq | INTRAVENOUS | Status: AC
  Administered 2015-05-10 (×3): 10 meq via INTRAVENOUS

## 2015-05-10 MED ORDER — LACTATED RINGERS IV SOLN
INTRAVENOUS | Status: DC | PRN
  Administered 2015-05-10 (×4): via INTRAVENOUS

## 2015-05-10 MED ORDER — SODIUM CHLORIDE 0.9 % IJ SOLN
3.0000 mL | Freq: Two times a day (BID) | INTRAMUSCULAR | Status: DC
  Administered 2015-05-11: 10 mL via INTRAVENOUS
  Administered 2015-05-12 – 2015-05-14 (×5): 3 mL via INTRAVENOUS

## 2015-05-10 MED ORDER — SODIUM CHLORIDE 0.9 % IV SOLN
INTRAVENOUS | Status: DC
  Administered 2015-05-10: 2.3 [IU]/h via INTRAVENOUS
  Filled 2015-05-10 (×2): qty 2.5

## 2015-05-10 MED ORDER — DEXMEDETOMIDINE HCL IN NACL 200 MCG/50ML IV SOLN
0.0000 ug/kg/h | INTRAVENOUS | Status: DC
  Filled 2015-05-10: qty 50

## 2015-05-10 MED ORDER — ACETAMINOPHEN 160 MG/5ML PO SOLN: 1000.0000 mg | Freq: Four times a day (QID) | ORAL | Status: DC

## 2015-05-10 MED ORDER — VANCOMYCIN HCL IN DEXTROSE 1-5 GM/200ML-% IV SOLN
1000.0000 mg | Freq: Two times a day (BID) | INTRAVENOUS | Status: AC
  Administered 2015-05-10 – 2015-05-11 (×3): 1000 mg via INTRAVENOUS
  Filled 2015-05-10 (×3): qty 200

## 2015-05-10 MED ORDER — PANTOPRAZOLE SODIUM 40 MG PO TBEC
40.0000 mg | DELAYED_RELEASE_TABLET | Freq: Every day | ORAL | Status: DC
  Administered 2015-05-12 – 2015-05-15 (×4): 40 mg via ORAL
  Filled 2015-05-10 (×4): qty 1

## 2015-05-10 MED ORDER — ACETAMINOPHEN 160 MG/5ML PO SOLN: 650.0000 mg | Freq: Once | ORAL | Status: AC

## 2015-05-10 MED ORDER — SIMVASTATIN 40 MG PO TABS
40.0000 mg | ORAL_TABLET | Freq: Every day | ORAL | Status: DC
  Administered 2015-05-11: 40 mg via ORAL
  Filled 2015-05-10 (×2): qty 1

## 2015-05-10 MED ORDER — ACETAMINOPHEN 500 MG PO TABS
1000.0000 mg | ORAL_TABLET | Freq: Four times a day (QID) | ORAL | Status: DC
  Administered 2015-05-11 – 2015-05-15 (×15): 1000 mg via ORAL
  Filled 2015-05-10 (×19): qty 2

## 2015-05-10 MED ORDER — BISACODYL 5 MG PO TBEC
10.0000 mg | DELAYED_RELEASE_TABLET | Freq: Every day | ORAL | Status: DC
Start: 2015-05-11 — End: 2015-05-15
  Administered 2015-05-11 – 2015-05-12 (×2): 10 mg via ORAL
  Filled 2015-05-10 (×2): qty 2

## 2015-05-10 MED ORDER — HEPARIN SODIUM (PORCINE) 1000 UNIT/ML IJ SOLN
INTRAMUSCULAR | Status: AC
  Filled 2015-05-10: qty 1

## 2015-05-10 MED ORDER — SODIUM CHLORIDE 0.9 % IV SOLN: Freq: Once | INTRAVENOUS | Status: DC

## 2015-05-10 MED ORDER — INFLUENZA VAC SPLIT QUAD 0.5 ML IM SUSY
0.5000 mL | PREFILLED_SYRINGE | INTRAMUSCULAR | Status: DC
  Filled 2015-05-10: qty 0.5

## 2015-05-10 MED ORDER — SODIUM CHLORIDE 0.9 % IJ SOLN: 3.0000 mL | INTRAMUSCULAR | Status: DC | PRN

## 2015-05-10 MED ORDER — NITROGLYCERIN IN D5W 200-5 MCG/ML-% IV SOLN: 0.0000 ug/min | INTRAVENOUS | Status: DC

## 2015-05-10 MED ORDER — PHENYLEPHRINE HCL 10 MG/ML IJ SOLN
10.0000 mg | INTRAVENOUS | Status: DC | PRN
  Administered 2015-05-10: 10 ug/min via INTRAVENOUS

## 2015-05-10 MED ORDER — SODIUM CHLORIDE 0.9 % IV SOLN: 250.0000 mL | INTRAVENOUS | Status: DC

## 2015-05-10 MED ORDER — PROTAMINE SULFATE 10 MG/ML IV SOLN
INTRAVENOUS | Status: AC
  Filled 2015-05-10: qty 25

## 2015-05-10 MED ORDER — FENTANYL CITRATE (PF) 100 MCG/2ML IJ SOLN
INTRAMUSCULAR | Status: DC | PRN
  Administered 2015-05-10: 150 ug via INTRAVENOUS
  Administered 2015-05-10: 750 ug via INTRAVENOUS
  Administered 2015-05-10 (×2): 250 ug via INTRAVENOUS
  Administered 2015-05-10: 100 ug via INTRAVENOUS

## 2015-05-10 MED ORDER — ACETAMINOPHEN 650 MG RE SUPP
650.0000 mg | Freq: Once | RECTAL | Status: AC
  Administered 2015-05-10: 650 mg via RECTAL

## 2015-05-10 MED ORDER — ASPIRIN 81 MG PO CHEW: 324.0000 mg | CHEWABLE_TABLET | Freq: Every day | ORAL | Status: DC

## 2015-05-10 MED ORDER — ALBUMIN HUMAN 5 % IV SOLN
INTRAVENOUS | Status: DC | PRN
  Administered 2015-05-10 (×2): via INTRAVENOUS

## 2015-05-10 MED ORDER — LACTATED RINGERS IV SOLN: 500.0000 mL | Freq: Once | INTRAVENOUS | Status: DC | PRN

## 2015-05-10 MED ORDER — ONDANSETRON HCL 4 MG/2ML IJ SOLN
4.0000 mg | Freq: Four times a day (QID) | INTRAMUSCULAR | Status: DC | PRN
  Administered 2015-05-10 – 2015-05-12 (×2): 4 mg via INTRAVENOUS
  Filled 2015-05-10 (×2): qty 2

## 2015-05-10 MED ORDER — ROCURONIUM BROMIDE 50 MG/5ML IV SOLN
INTRAVENOUS | Status: AC
  Filled 2015-05-10: qty 3

## 2015-05-10 MED ORDER — PIPERACILLIN-TAZOBACTAM 3.375 G IVPB
3.3750 g | Freq: Three times a day (TID) | INTRAVENOUS | Status: DC
  Administered 2015-05-11 – 2015-05-12 (×5): 3.375 g via INTRAVENOUS
  Filled 2015-05-10 (×5): qty 50

## 2015-05-10 MED ORDER — ROCURONIUM BROMIDE 50 MG/5ML IV SOLN
INTRAVENOUS | Status: AC
  Filled 2015-05-10: qty 1

## 2015-05-10 MED ORDER — METOPROLOL TARTRATE 25 MG/10 ML ORAL SUSPENSION
12.5000 mg | Freq: Two times a day (BID) | ORAL | Status: DC
  Filled 2015-05-10 (×2): qty 5

## 2015-05-10 MED ORDER — PIPERACILLIN-TAZOBACTAM 3.375 G IVPB
3.3750 g | Freq: Four times a day (QID) | INTRAVENOUS | Status: DC
  Administered 2015-05-10: 3.375 g via INTRAVENOUS
  Filled 2015-05-10 (×3): qty 50

## 2015-05-10 MED ORDER — ASPIRIN EC 325 MG PO TBEC
325.0000 mg | DELAYED_RELEASE_TABLET | Freq: Every day | ORAL | Status: DC
  Filled 2015-05-10: qty 1

## 2015-05-10 MED ORDER — SODIUM CHLORIDE 0.9 % IV SOLN: INTRAVENOUS | Status: DC

## 2015-05-10 MED ORDER — LIDOCAINE HCL (CARDIAC) 20 MG/ML IV SOLN
INTRAVENOUS | Status: DC | PRN
  Administered 2015-05-10: 100 mg via INTRAVENOUS

## 2015-05-10 MED ORDER — MIDAZOLAM HCL 2 MG/2ML IJ SOLN: 2.0000 mg | INTRAMUSCULAR | Status: DC | PRN

## 2015-05-10 MED ORDER — NOREPINEPHRINE BITARTRATE 1 MG/ML IV SOLN
0.0000 ug/min | INTRAVENOUS | Status: DC
  Filled 2015-05-10: qty 4

## 2015-05-10 MED ORDER — PHENYLEPHRINE HCL 10 MG/ML IJ SOLN
0.0000 ug/min | INTRAVENOUS | Status: DC
  Administered 2015-05-11 (×2): 30 ug/min via INTRAVENOUS
  Filled 2015-05-10 (×3): qty 2

## 2015-05-10 MED ORDER — DEXTROSE 5 % IV SOLN
1.5000 g | Freq: Two times a day (BID) | INTRAVENOUS | Status: DC
  Filled 2015-05-10: qty 1.5

## 2015-05-10 MED ORDER — CHLORHEXIDINE GLUCONATE CLOTH 2 % EX PADS
6.0000 | MEDICATED_PAD | Freq: Every day | CUTANEOUS | Status: DC
  Administered 2015-05-11 – 2015-05-14 (×4): 6 via TOPICAL

## 2015-05-10 MED ORDER — DOCUSATE SODIUM 100 MG PO CAPS
200.0000 mg | ORAL_CAPSULE | Freq: Every day | ORAL | Status: DC
Start: 2015-05-11 — End: 2015-05-15
  Administered 2015-05-11 – 2015-05-13 (×3): 200 mg via ORAL
  Filled 2015-05-10 (×3): qty 2

## 2015-05-10 MED ORDER — PROTAMINE SULFATE 10 MG/ML IV SOLN
INTRAVENOUS | Status: DC | PRN
  Administered 2015-05-10 (×2): 50 mg via INTRAVENOUS
  Administered 2015-05-10: 40 mg via INTRAVENOUS
  Administered 2015-05-10: 10 mg via INTRAVENOUS
  Administered 2015-05-10 (×2): 50 mg via INTRAVENOUS

## 2015-05-10 MED ORDER — MORPHINE SULFATE (PF) 2 MG/ML IV SOLN
2.0000 mg | INTRAVENOUS | Status: DC | PRN
  Administered 2015-05-10 (×2): 2 mg via INTRAVENOUS
  Administered 2015-05-11 (×2): 4 mg via INTRAVENOUS
  Administered 2015-05-11 (×2): 2 mg via INTRAVENOUS
  Administered 2015-05-12: 4 mg via INTRAVENOUS
  Filled 2015-05-10: qty 2
  Filled 2015-05-10: qty 1
  Filled 2015-05-10 (×2): qty 2
  Filled 2015-05-10 (×2): qty 1
  Filled 2015-05-10: qty 2

## 2015-05-10 MED ORDER — LORATADINE 10 MG PO TABS
10.0000 mg | ORAL_TABLET | Freq: Every day | ORAL | Status: DC
  Administered 2015-05-11 – 2015-05-15 (×5): 10 mg via ORAL
  Filled 2015-05-10 (×5): qty 1

## 2015-05-10 MED ORDER — SODIUM CHLORIDE 0.9 % IJ SOLN
OROMUCOSAL | Status: DC | PRN
  Administered 2015-05-10 (×3): 4 mL via TOPICAL

## 2015-05-10 MED ORDER — DEXMEDETOMIDINE HCL IN NACL 400 MCG/100ML IV SOLN
0.4000 ug/kg/h | INTRAVENOUS | Status: DC
  Filled 2015-05-10: qty 100

## 2015-05-10 MED ORDER — CHLORHEXIDINE GLUCONATE 0.12 % MT SOLN
15.0000 mL | Freq: Four times a day (QID) | OROMUCOSAL | Status: DC
  Administered 2015-05-10 – 2015-05-15 (×12): 15 mL via OROMUCOSAL
  Filled 2015-05-10 (×10): qty 15

## 2015-05-10 MED FILL — Heparin Sodium (Porcine) Inj 1000 Unit/ML: INTRAMUSCULAR | Qty: 30 | Status: AC

## 2015-05-10 MED FILL — Electrolyte-R (PH 7.4) Solution: INTRAVENOUS | Qty: 4000 | Status: AC

## 2015-05-10 MED FILL — Heparin Sodium (Porcine) Inj 1000 Unit/ML: INTRAMUSCULAR | Qty: 10 | Status: AC

## 2015-05-10 MED FILL — Potassium Chloride Inj 2 mEq/ML: INTRAVENOUS | Qty: 40 | Status: AC

## 2015-05-10 MED FILL — Sodium Bicarbonate IV Soln 8.4%: INTRAVENOUS | Qty: 50 | Status: AC

## 2015-05-10 MED FILL — Sodium Chloride IV Soln 0.9%: INTRAVENOUS | Qty: 2000 | Status: AC

## 2015-05-10 MED FILL — Mannitol IV Soln 20%: INTRAVENOUS | Qty: 500 | Status: AC

## 2015-05-10 MED FILL — Magnesium Sulfate Inj 50%: INTRAMUSCULAR | Qty: 10 | Status: AC

## 2015-05-10 MED FILL — Lidocaine HCl IV Inj 20 MG/ML: INTRAVENOUS | Qty: 5 | Status: AC

## 2015-05-10 SURGICAL SUPPLY — 105 items
ADAPTER CARDIO PERF ANTE/RETRO (ADAPTER) ×4 IMPLANT
ADH SKN CLS LQ APL DERMABOND (GAUZE/BANDAGES/DRESSINGS) ×2
ADPR PRFSN 84XANTGRD RTRGD (ADAPTER) ×4
BAG DECANTER FOR FLEXI CONT (MISCELLANEOUS) ×3 IMPLANT
BANDAGE ELASTIC 4 VELCRO ST LF (GAUZE/BANDAGES/DRESSINGS) ×4 IMPLANT
BANDAGE ELASTIC 6 VELCRO ST LF (GAUZE/BANDAGES/DRESSINGS) ×4 IMPLANT
BASKET HEART (ORDER IN 25'S) (MISCELLANEOUS) ×1
BASKET HEART (ORDER IN 25S) (MISCELLANEOUS) ×2 IMPLANT
BLADE STERNUM SYSTEM 6 (BLADE) ×3 IMPLANT
BLADE SURG 11 STRL SS (BLADE) ×1 IMPLANT
BLADE SURG 12 STRL SS (BLADE) ×3 IMPLANT
BLADE SURG ROTATE 9660 (MISCELLANEOUS) ×1 IMPLANT
BNDG GAUZE ELAST 4 BULKY (GAUZE/BANDAGES/DRESSINGS) ×4 IMPLANT
CANISTER SUCTION 2500CC (MISCELLANEOUS) ×3 IMPLANT
CANNULA GUNDRY RCSP 15FR (MISCELLANEOUS) ×3 IMPLANT
CATH CPB KIT VANTRIGT (MISCELLANEOUS) ×3 IMPLANT
CATH ROBINSON RED A/P 18FR (CATHETERS) ×9 IMPLANT
CATH THORACIC 36FR RT ANG (CATHETERS) ×3 IMPLANT
CLIP FOGARTY SPRING 6M (CLIP) ×1 IMPLANT
CLIP TI WIDE RED SMALL 24 (CLIP) ×1 IMPLANT
CONT SPEC 4OZ CLIKSEAL STRL BL (MISCELLANEOUS) ×1 IMPLANT
COVER SURGICAL LIGHT HANDLE (MISCELLANEOUS) ×2 IMPLANT
CRADLE DONUT ADULT HEAD (MISCELLANEOUS) ×2 IMPLANT
DERMABOND ADHESIVE PROPEN (GAUZE/BANDAGES/DRESSINGS) ×1
DERMABOND ADVANCED .7 DNX6 (GAUZE/BANDAGES/DRESSINGS) IMPLANT
DRAIN CHANNEL 32F RND 10.7 FF (WOUND CARE) ×3 IMPLANT
DRAPE CARDIOVASCULAR INCISE (DRAPES) ×3
DRAPE SLUSH/WARMER DISC (DRAPES) ×3 IMPLANT
DRAPE SRG 135X102X78XABS (DRAPES) ×2 IMPLANT
DRSG AQUACEL AG ADV 3.5X14 (GAUZE/BANDAGES/DRESSINGS) ×3 IMPLANT
ELECT BLADE 4.0 EZ CLEAN MEGAD (MISCELLANEOUS) ×3
ELECT BLADE 6.5 EXT (BLADE) ×3 IMPLANT
ELECT CAUTERY BLADE 6.4 (BLADE) ×3 IMPLANT
ELECT REM PT RETURN 9FT ADLT (ELECTROSURGICAL) ×6
ELECTRODE BLDE 4.0 EZ CLN MEGD (MISCELLANEOUS) ×2 IMPLANT
ELECTRODE REM PT RTRN 9FT ADLT (ELECTROSURGICAL) ×4 IMPLANT
GAUZE SPONGE 4X4 12PLY STRL (GAUZE/BANDAGES/DRESSINGS) ×6 IMPLANT
GLOVE BIO SURGEON STRL SZ 6.5 (GLOVE) ×1 IMPLANT
GLOVE BIO SURGEON STRL SZ7.5 (GLOVE) ×9 IMPLANT
GOWN STRL REUS W/ TWL LRG LVL3 (GOWN DISPOSABLE) ×8 IMPLANT
GOWN STRL REUS W/TWL LRG LVL3 (GOWN DISPOSABLE) ×27
HEMOSTAT POWDER SURGIFOAM 1G (HEMOSTASIS) ×9 IMPLANT
HEMOSTAT SURGICEL 2X14 (HEMOSTASIS) ×3 IMPLANT
INSERT FOGARTY XLG (MISCELLANEOUS) IMPLANT
KIT BASIN OR (CUSTOM PROCEDURE TRAY) ×3 IMPLANT
KIT ROOM TURNOVER OR (KITS) ×3 IMPLANT
KIT SUCTION CATH 14FR (SUCTIONS) ×3 IMPLANT
KIT VASOVIEW W/TROCAR VH 2000 (KITS) ×3 IMPLANT
LEAD PACING MYOCARDI (MISCELLANEOUS) ×4 IMPLANT
MARKER GRAFT CORONARY BYPASS (MISCELLANEOUS) ×9 IMPLANT
NS IRRIG 1000ML POUR BTL (IV SOLUTION) ×16 IMPLANT
PACK OPEN HEART (CUSTOM PROCEDURE TRAY) ×3 IMPLANT
PAD ARMBOARD 7.5X6 YLW CONV (MISCELLANEOUS) ×6 IMPLANT
PAD ELECT DEFIB RADIOL ZOLL (MISCELLANEOUS) ×3 IMPLANT
PENCIL BUTTON HOLSTER BLD 10FT (ELECTRODE) ×3 IMPLANT
PUNCH AORTIC ROTATE  4.5MM 8IN (MISCELLANEOUS) ×1 IMPLANT
PUNCH AORTIC ROTATE 4.0MM (MISCELLANEOUS) IMPLANT
PUNCH AORTIC ROTATE 4.5MM 8IN (MISCELLANEOUS) IMPLANT
PUNCH AORTIC ROTATE 5MM 8IN (MISCELLANEOUS) IMPLANT
SET CARDIOPLEGIA MPS 5001102 (MISCELLANEOUS) ×1 IMPLANT
SPONGE LAP 18X18 X RAY DECT (DISPOSABLE) ×1 IMPLANT
SPONGE LAP 4X18 X RAY DECT (DISPOSABLE) ×1 IMPLANT
SURGIFLO W/THROMBIN 8M KIT (HEMOSTASIS) ×3 IMPLANT
SUT BONE WAX W31G (SUTURE) ×3 IMPLANT
SUT ETHIBOND 2 0 SH (SUTURE) ×12
SUT ETHIBOND 2 0 SH 36X2 (SUTURE) IMPLANT
SUT MNCRL AB 4-0 PS2 18 (SUTURE) ×1 IMPLANT
SUT PROLENE 3 0 SH DA (SUTURE) IMPLANT
SUT PROLENE 3 0 SH1 36 (SUTURE) IMPLANT
SUT PROLENE 4 0 RB 1 (SUTURE) ×9
SUT PROLENE 4 0 SH DA (SUTURE) ×3 IMPLANT
SUT PROLENE 4-0 RB1 .5 CRCL 36 (SUTURE) ×2 IMPLANT
SUT PROLENE 5 0 C 1 36 (SUTURE) ×2 IMPLANT
SUT PROLENE 6 0 C 1 30 (SUTURE) ×4 IMPLANT
SUT PROLENE 6 0 CC (SUTURE) ×13 IMPLANT
SUT PROLENE 8 0 BV175 6 (SUTURE) ×2 IMPLANT
SUT PROLENE BLUE 7 0 (SUTURE) ×4 IMPLANT
SUT SILK  1 MH (SUTURE) ×3
SUT SILK 1 MH (SUTURE) IMPLANT
SUT SILK 1 TIES 10X30 (SUTURE) ×1 IMPLANT
SUT SILK 2 0 SH CR/8 (SUTURE) ×3 IMPLANT
SUT SILK 2 0 TIES 10X30 (SUTURE) ×1 IMPLANT
SUT SILK 2 0 TIES 17X18 (SUTURE) ×3
SUT SILK 2-0 18XBRD TIE BLK (SUTURE) IMPLANT
SUT SILK 3 0 SH CR/8 (SUTURE) ×1 IMPLANT
SUT SILK 4 0 TIE 10X30 (SUTURE) ×2 IMPLANT
SUT STEEL 6MS V (SUTURE) ×6 IMPLANT
SUT STEEL SZ 6 DBL 3X14 BALL (SUTURE) ×3 IMPLANT
SUT TEM PAC WIRE 2 0 SH (SUTURE) ×4 IMPLANT
SUT VIC AB 1 CTX 36 (SUTURE) ×12
SUT VIC AB 1 CTX36XBRD ANBCTR (SUTURE) ×4 IMPLANT
SUT VIC AB 2-0 CT1 27 (SUTURE)
SUT VIC AB 2-0 CT1 36 (SUTURE) ×1 IMPLANT
SUT VIC AB 2-0 CT1 TAPERPNT 27 (SUTURE) IMPLANT
SUT VIC AB 2-0 CTX 27 (SUTURE) ×2 IMPLANT
SUT VIC AB 3-0 X1 27 (SUTURE) ×2 IMPLANT
SUTURE E-PAK OPEN HEART (SUTURE) ×2 IMPLANT
SYSTEM SAHARA CHEST DRAIN ATS (WOUND CARE) ×3 IMPLANT
TAPE CLOTH SURG 4X10 WHT LF (GAUZE/BANDAGES/DRESSINGS) ×1 IMPLANT
TOWEL OR 17X24 6PK STRL BLUE (TOWEL DISPOSABLE) ×6 IMPLANT
TOWEL OR 17X26 10 PK STRL BLUE (TOWEL DISPOSABLE) ×6 IMPLANT
TRAY FOLEY IC TEMP SENS 16FR (CATHETERS) ×3 IMPLANT
TUBING INSUFFLATION (TUBING) ×3 IMPLANT
UNDERPAD 30X30 INCONTINENT (UNDERPADS AND DIAPERS) ×3 IMPLANT
WATER STERILE IRR 1000ML POUR (IV SOLUTION) ×6 IMPLANT

## 2015-05-10 NOTE — OR Nursing (Signed)
Twenty minute call to SICU charge nurse at [phone].

## 2015-05-10 NOTE — Transfer of Care (Signed)
Immediate Anesthesia Transfer of Care Note  Patient: [name]  Procedure(s) Performed: Procedure(s): CORONARY ARTERY BYPASS GRAFTING (CABG), ON PUMP, TIMES FOUR, USING LEFT INTERNAL MAMMARY ARTERY, BILATERAL GREATER SAPHENOUS VEINS HARVESTED ENDOSCOPICALLY, WITH CORONARY ENDARTERECTOMY (N/A) TRANSESOPHAGEAL ECHOCARDIOGRAM (TEE) (N/A)  Patient Location: SICU  Anesthesia Type:General  Level of Consciousness: sedated and unresponsive  Airway & Oxygen Therapy: Patient remains intubated per anesthesia plan and Patient placed on Ventilator (see vital sign flow sheet for setting)  Post-op Assessment: Report given to RN and Post -op Vital signs reviewed and stable  Post vital signs: Reviewed and stable  Last Vitals:  Filed Vitals:   05/10/15 0611  BP: 156/79  Temp: 36.7 C  Resp: 18    Complications: No apparent anesthesia complications

## 2015-05-10 NOTE — Brief Op Note (Signed)
05/10/2015  11:57 AM  PATIENT:  [name]  74 y.o. male  PRE-OPERATIVE DIAGNOSIS:  SEVERE CAD  POST-OPERATIVE DIAGNOSIS:  SEVERE CAD  PROCEDURE: TRANSESOPHAGEAL ECHOCARDIOGRAM (TEE), MEDIAN STERNOTOMY for CORONARY ARTERY BYPASS GRAFTING (CABG) x 4 (LIMA to LAD, SVG to DIAGONAL 2, SVG to OM, SVG to RCA)  USING LEFT INTERNAL MAMMARY ARTERY, BILATERAL GREATER SAPHENOUS THIGH VEINS HARVESTED ENDOSCOPICALLY, WITH CORONARY ENDARTERECTOMY (OM)  SURGEON:  Surgeon(s) and Role:    * Kerin PernaPeter Van Trigt, MD - Primary  PHYSICIAN ASSISTANT: Doree Fudgeonielle Zimmerman PA-C  ANESTHESIA:   general  EBL:  Total I/O In: -  Out: 550 [Urine:550]  BLOOD ADMINISTERED:Two FFP and One PLTS  DRAINS: Chest tubes placed in the pleural and mediastinal spaces   SPECIMEN:  Source of Specimen:  Plaque from OM  DISPOSITION OF SPECIMEN:  PATHOLOGY  COUNTS CORRECT:  YES  DICTATION: .Dragon Dictation  PLAN OF CARE: Admit to inpatient   PATIENT DISPOSITION:  ICU - intubated and hemodynamically stable.   Delay start of Pharmacological VTE agent (>24hrs) due to surgical blood loss or risk of bleeding: yes  BASELINE WEIGHT: 99 kg

## 2015-05-10 NOTE — OR Nursing (Signed)
Forty-five minute call to SICU charge nurse at [phone].

## 2015-05-10 NOTE — Anesthesia Procedure Notes (Addendum)
Anesthesia Procedure Note PA catheter:  Routine monitors. Timeout, sterile prep, drape, FBP R neck.  Trendelenburg position.  1% Lido local, finder and trocar RIJ 1st pass with US guidance.  Cordis placed over J wire. PA catheter in easily.  Sterile dressing applied.  Patient tolerated well, VSS.  Jenita Seashore, MD 06:50-07:01 Procedure Name: Intubation Date/Time: 05/10/2015 7:50 AM Performed by: Mariea Clonts Pre-anesthesia Checklist: Emergency Drugs available, Timeout performed, Suction available, Patient identified and Patient being monitored Patient Re-evaluated:Patient Re-evaluated prior to inductionOxygen Delivery Method: Circle system utilized Preoxygenation: Pre-oxygenation with 100% oxygen Ventilation: Mask ventilation without difficulty and Oral airway inserted - appropriate to patient size Laryngoscope Size: Sabra Heck and 3 Grade View: Grade II Tube type: Oral Tube size: 8.0 mm Number of attempts: 1 Placement Confirmation: ETT inserted through vocal cords under direct vision,  breath sounds checked- equal and bilateral and positive ETCO2 Tube secured with: Tape Dental Injury: Teeth and Oropharynx as per pre-operative assessment

## 2015-05-10 NOTE — Anesthesia Preprocedure Evaluation (Signed)
Anesthesia Evaluation  Patient identified by MRN, date of birth, ID band Patient awake    Reviewed: Allergy & Precautions, NPO status , Patient's Chart, lab work & pertinent test results  Airway Mallampati: II  TM Distance: >3 FB Neck ROM: Full    Dental   Pulmonary    Pulmonary exam normal        Cardiovascular hypertension, Pt. on medications + CAD and + Past MI  Normal cardiovascular exam     Neuro/Psych    GI/Hepatic   Endo/Other    Renal/GU      Musculoskeletal   Abdominal   Peds  Hematology   Anesthesia Other Findings   Reproductive/Obstetrics                             Anesthesia Physical Anesthesia Plan  ASA: III  Anesthesia Plan: General   Post-op Pain Management:    Induction: Intravenous  Airway Management Planned: Oral ETT  Additional Equipment: Arterial line, CVP, PA Cath and TEE  Intra-op Plan:   Post-operative Plan: Post-operative intubation/ventilation  Informed Consent: I have reviewed the patients History and Physical, chart, labs and discussed the procedure including the risks, benefits and alternatives for the proposed anesthesia with the patient or authorized representative who has indicated his/her understanding and acceptance.     Plan Discussed with: CRNA and Surgeon  Anesthesia Plan Comments:         Anesthesia Quick Evaluation

## 2015-05-10 NOTE — Progress Notes (Signed)
      [address]       [place] [id number]             [phone]      Intubated, weaning in progress  BP 124/52 mmHg  Pulse 68  Temp(Src) 98.6 F (37 C) (Oral)  Resp 17  Ht 6\' 1"  (1.854 m)  Wt 219 lb 9 oz (99.593 kg)  BMI 28.97 kg/m2  SpO2 100%   Intake/Output Summary (Last 24 hours) at 05/10/15 1759 Last data filed at 05/10/15 1741  Gross per 24 hour  Intake 5205.37 ml  Output   3782 ml  Net 1423.37 ml    PA= 19/7, CI= 2.12  Doing well early postop NIF marginal but good VC- will extubate  Douglas SpareSteven C. Dorris FetchHendrickson, MD [facility] [phone]

## 2015-05-10 NOTE — Procedures (Signed)
Extubation Procedure Note  Patient Details:   Name: Douglas MilesHerman [name] DOB: 03/02/1941 MRN: [id number]   Airway Documentation:     Evaluation  O2 sats: stable throughout  Complications: No apparent complications Patient did tolerate procedure well. Bilateral Breath Sounds: Clear   Yes   Extubation per MD at beside/Wean protocol.  Air leak present prior to extubation.  Patient able to follow commands.  Extubated to 5lpm N/C without complication.  Patient able to speak/cough post extubation.  Respiratory will continue to follow.  Ronda Fairlyicholas J Arend Bahl 05/10/2015, 6:13 PM

## 2015-05-10 NOTE — Progress Notes (Signed)
The patient was examined and preop studies reviewed. There has been no change from the prior exam and the patient is ready for surgery.   Plan CABG on [employer]

## 2015-05-10 NOTE — Progress Notes (Signed)
*  PRELIMINARY RESULTS* Echocardiogram Echocardiogram Transesophageal has been performed.  [name], [name] 05/10/2015, 9:30 AM

## 2015-05-10 NOTE — Anesthesia Postprocedure Evaluation (Signed)
Anesthesia Post Note  Patient: [name]  Procedure(s) Performed: Procedure(s) (LRB): CORONARY ARTERY BYPASS GRAFTING (CABG), ON PUMP, TIMES FOUR, USING LEFT INTERNAL MAMMARY ARTERY, BILATERAL GREATER SAPHENOUS VEINS HARVESTED ENDOSCOPICALLY, WITH CORONARY ENDARTERECTOMY (N/A) TRANSESOPHAGEAL ECHOCARDIOGRAM (TEE) (N/A)  Anesthesia type: General  Patient location: ICU  Post pain: Pain level controlled  Post assessment: Post-op Vital signs reviewed  Last Vitals:  Filed Vitals:   05/10/15 1500  BP: 101/59  Pulse: 68  Temp: 36.4 C  Resp: 12    Post vital signs: stable  Level of consciousness: Patient remains intubated per anesthesia plan  Complications: No apparent anesthesia complications

## 2015-05-11 ENCOUNTER — Inpatient Hospital Stay (HOSPITAL_COMMUNITY): Payer: Medicare Other

## 2015-05-11 ENCOUNTER — Encounter (HOSPITAL_COMMUNITY): Payer: Self-pay | Admitting: Cardiothoracic Surgery

## 2015-05-11 LAB — CREATININE, SERUM
Creatinine, Ser: 1.32 mg/dL — ABNORMAL HIGH (ref 0.61–1.24)
GFR calc Af Amer: 59 mL/min — ABNORMAL LOW (ref >60)
GFR calc non Af Amer: 51 mL/min — ABNORMAL LOW (ref >60)

## 2015-05-11 LAB — GLUCOSE, CAPILLARY
Glucose-Capillary: 106 mg/dL — ABNORMAL HIGH (ref 65–99)
Glucose-Capillary: 107 mg/dL — ABNORMAL HIGH (ref 65–99)
Glucose-Capillary: 112 mg/dL — ABNORMAL HIGH (ref 65–99)
Glucose-Capillary: 99 mg/dL (ref 65–99)
Glucose-Capillary: 99 mg/dL (ref 65–99)
Glucose-Capillary: 108 mg/dL — ABNORMAL HIGH (ref 65–99)
Glucose-Capillary: 112 mg/dL — ABNORMAL HIGH (ref 65–99)
Glucose-Capillary: 117 mg/dL — ABNORMAL HIGH (ref 65–99)
Glucose-Capillary: 121 mg/dL — ABNORMAL HIGH (ref 65–99)
Glucose-Capillary: 123 mg/dL — ABNORMAL HIGH (ref 65–99)
Glucose-Capillary: 138 mg/dL — ABNORMAL HIGH (ref 65–99)
Glucose-Capillary: 117 mg/dL — ABNORMAL HIGH (ref 65–99)
Glucose-Capillary: 159 mg/dL — ABNORMAL HIGH (ref 65–99)
Glucose-Capillary: 150 mg/dL — ABNORMAL HIGH (ref 65–99)

## 2015-05-11 LAB — CBC
HCT: 28 % — ABNORMAL LOW (ref 39.0–52.0)
HEMATOCRIT: 25.7 % — AB (ref 39.0–52.0)
HEMOGLOBIN: 9.4 g/dL — AB (ref 13.0–17.0)
HEMOGLOBIN: 8.7 g/dL — AB (ref 13.0–17.0)
MCH: 31.3 pg (ref 26.0–34.0)
MCH: 31.6 pg (ref 26.0–34.0)
MCHC: 33.6 g/dL (ref 30.0–36.0)
MCHC: 33.9 g/dL (ref 30.0–36.0)
MCV: 93.3 fL (ref 78.0–100.0)
MCV: 93.5 fL (ref 78.0–100.0)
Platelets: 166 10*3/uL (ref 150–400)
Platelets: 145 10*3/uL — ABNORMAL LOW (ref 150–400)
RBC: 3 MIL/uL — ABNORMAL LOW (ref 4.22–5.81)
RBC: 2.75 MIL/uL — ABNORMAL LOW (ref 4.22–5.81)
RDW: 13.2 % (ref 11.5–15.5)
RDW: 13.6 % (ref 11.5–15.5)
WBC: 10.2 10*3/uL (ref 4.0–10.5)
WBC: 9.9 10*3/uL (ref 4.0–10.5)

## 2015-05-11 LAB — BASIC METABOLIC PANEL
Anion gap: 7 (ref 5–15)
BUN: 16 mg/dL (ref 6–20)
CALCIUM: 8.1 mg/dL — AB (ref 8.9–10.3)
CHLORIDE: 110 mmol/L (ref 101–111)
CO2: 24 mmol/L (ref 22–32)
Creatinine, Ser: 0.92 mg/dL (ref 0.61–1.24)
GFR calc non Af Amer: >60 mL/min (ref >60)
Glucose, Bld: 101 mg/dL — ABNORMAL HIGH (ref 65–99)
Potassium: 4.2 mmol/L (ref 3.5–5.1)
SODIUM: 141 mmol/L (ref 135–145)

## 2015-05-11 LAB — POCT I-STAT 3, ART BLOOD GAS (G3+)
Acid-base deficit: 1 mmol/L (ref 0.0–2.0)
Bicarbonate: 23.6 mEq/L (ref 20.0–24.0)
O2 Saturation: 91 %
Patient temperature: 38
TCO2: 25 mmol/L (ref 0–100)
pCO2 arterial: 39.4 mmHg (ref 35.0–45.0)
pH, Arterial: 7.388 (ref 7.350–7.450)
pO2, Arterial: 65 mmHg — ABNORMAL LOW (ref 80.0–100.0)

## 2015-05-11 LAB — TYPE AND SCREEN
ABO/RH(D): A POS
Antibody Screen: NEGATIVE

## 2015-05-11 LAB — MAGNESIUM
MAGNESIUM: 2.4 mg/dL (ref 1.7–2.4)
Magnesium: 2.3 mg/dL (ref 1.7–2.4)

## 2015-05-11 LAB — PREPARE FRESH FROZEN PLASMA
Unit division: 0
Unit division: 0

## 2015-05-11 LAB — POCT I-STAT, CHEM 8
BUN: 18 mg/dL (ref 6–20)
Calcium, Ion: 1.1 mmol/L — ABNORMAL LOW (ref 1.13–1.30)
Chloride: 105 mmol/L (ref 101–111)
Creatinine, Ser: 1.1 mg/dL (ref 0.61–1.24)
Glucose, Bld: 136 mg/dL — ABNORMAL HIGH (ref 65–99)
HCT: 25 % — ABNORMAL LOW (ref 39.0–52.0)
Hemoglobin: 8.5 g/dL — ABNORMAL LOW (ref 13.0–17.0)
Potassium: 3.5 mmol/L (ref 3.5–5.1)
Sodium: 138 mmol/L (ref 135–145)
TCO2: 19 mmol/L (ref 0–100)

## 2015-05-11 LAB — PREPARE PLATELET PHERESIS: Unit division: 0

## 2015-05-11 MED ORDER — KETOROLAC TROMETHAMINE 15 MG/ML IJ SOLN
15.0000 mg | Freq: Four times a day (QID) | INTRAMUSCULAR | Status: AC
  Administered 2015-05-11 (×2): 15 mg via INTRAVENOUS
  Filled 2015-05-11 (×2): qty 1

## 2015-05-11 MED ORDER — INSULIN ASPART 100 UNIT/ML ~~LOC~~ SOLN: 0.0000 [IU] | SUBCUTANEOUS | Status: DC

## 2015-05-11 MED ORDER — INSULIN ASPART 100 UNIT/ML ~~LOC~~ SOLN
0.0000 [IU] | SUBCUTANEOUS | Status: DC
  Administered 2015-05-11 – 2015-05-12 (×4): 2 [IU] via SUBCUTANEOUS

## 2015-05-11 MED ORDER — POTASSIUM CHLORIDE 10 MEQ/50ML IV SOLN
INTRAVENOUS | Status: AC
  Filled 2015-05-11: qty 100

## 2015-05-11 MED ORDER — POTASSIUM CHLORIDE 10 MEQ/50ML IV SOLN
10.0000 meq | INTRAVENOUS | Status: AC
  Administered 2015-05-11 (×4): 10 meq via INTRAVENOUS

## 2015-05-11 MED ORDER — HYDROMORPHONE HCL 2 MG PO TABS
2.0000 mg | ORAL_TABLET | ORAL | Status: DC | PRN
  Administered 2015-05-11 – 2015-05-12 (×2): 2 mg via ORAL
  Filled 2015-05-11 (×2): qty 1

## 2015-05-11 MED ORDER — ASPIRIN EC 81 MG PO TBEC
81.0000 mg | DELAYED_RELEASE_TABLET | Freq: Every day | ORAL | Status: DC
  Administered 2015-05-11 – 2015-05-15 (×5): 81 mg via ORAL
  Filled 2015-05-11 (×5): qty 1

## 2015-05-11 MED ORDER — CLOPIDOGREL BISULFATE 75 MG PO TABS
75.0000 mg | ORAL_TABLET | Freq: Every day | ORAL | Status: DC
  Administered 2015-05-11 – 2015-05-15 (×5): 75 mg via ORAL
  Filled 2015-05-11 (×5): qty 1

## 2015-05-11 MED ORDER — FUROSEMIDE 10 MG/ML IJ SOLN
20.0000 mg | Freq: Two times a day (BID) | INTRAMUSCULAR | Status: AC
  Administered 2015-05-11 – 2015-05-12 (×3): 20 mg via INTRAVENOUS
  Filled 2015-05-11 (×6): qty 2

## 2015-05-11 NOTE — Progress Notes (Signed)
Patient ID: [name], male   DOB: 12/24/1940, 74 y.o.   MRN: [id number]  SICU Evening Rounds:  Hemodynamically stable off neo and dopamine.  Urine output ok  Up in chair  BMP Latest Ref Rng 05/11/2015 05/11/2015 05/11/2015  Glucose 65 - 99 mg/dL 409(W136(H) - 119(J101(H)  BUN 6 - 20 mg/dL 18 - 16  Creatinine 4.780.61 - 1.24 mg/dL 2.951.10 6.21(H1.32(H) 0.860.92  BUN/Creat Ratio 10 - 22 - - -  Sodium 135 - 145 mmol/L 138 - 141  Potassium 3.5 - 5.1 mmol/L 3.5 - 4.2  Chloride 101 - 111 mmol/L 105 - 110  CO2 22 - 32 mmol/L - - 24  Calcium 8.9 - 10.3 mg/dL - - 8.1(L)       CBC Latest Ref Rng 05/11/2015 05/11/2015 05/11/2015  WBC 4.0 - 10.5 K/uL - 9.9 10.2  Hemoglobin 13.0 - 17.0 g/dL 5.7(Q8.5(L) 4.6(N8.7(L) 6.2(X9.4(L)  Hematocrit 39.0 - 52.0 % 25.0(L) 25.7(L) 28.0(L)  Platelets 150 - 400 K/uL - 145(L) 166    A/P: stable, continue current plans.

## 2015-05-11 NOTE — Progress Notes (Signed)
1 Day Post-Op Procedure(s) (LRB): CORONARY ARTERY BYPASS GRAFTING (CABG), ON PUMP, TIMES FOUR, USING LEFT INTERNAL MAMMARY ARTERY, BILATERAL GREATER SAPHENOUS VEINS HARVESTED ENDOSCOPICALLY, WITH CORONARY ENDARTERECTOMY (N/A) TRANSESOPHAGEAL ECHOCARDIOGRAM (TEE) (N/A) Subjective:  CABG w/ cor endarerectomy Oropharyngeal mucosa tear from TEE NSR on neo  Objective: Vital signs in last 24 hours: Temp:  [97.2 F (36.2 C)-101.1 F (38.4 C)] 100 F (37.8 C) (11/01 0700) Pulse Rate:  [64-81] 81 (11/01 0700) Cardiac Rhythm:  [-] Heart block (11/01 0400) Resp:  [12-39] 28 (11/01 0700) BP: (70-184)/(42-71) 96/54 mmHg (11/01 0700) SpO2:  [90 %-100 %] 90 % (11/01 0700) Arterial Line BP: (94-136)/(40-55) 121/49 mmHg (11/01 0700) FiO2 (%):  [40 %-50 %] 40 % (10/31 1705) Weight:  [214 lb 4.6 oz (97.2 kg)] 214 lb 4.6 oz (97.2 kg) (11/01 0500)  Hemodynamic parameters for last 24 hours: PAP: (15-72)/(4-13) 30/12 mmHg CO:  [4.7 L/min-6.5 L/min] 6.5 L/min CI:  [2.1 L/min/m2-3 L/min/m2] 2.9 L/min/m2  Intake/Output from previous day: 10/31 0701 - 11/01 0700 In: 6844.4 [I.V.:4458.9; Blood:1198; IV Piggyback:1187.5] Out: 5862 [Urine:4150; Blood:1000; Chest Tube:712] Intake/Output this shift:    Lungs clear CXR w/  Low lug volumes [name] intact Lab Results:  Recent Labs  05/10/15 2133 05/11/15 0412  WBC 9.3 10.2  HGB 9.9* 9.4*  HCT 29.1* 28.0*  PLT 159 166   BMET:  Recent Labs  05/10/15 2124 05/10/15 2133 05/11/15 0412  NA 141  --  141  K 4.2  --  4.2  CL 106  --  110  CO2  --   --  24  GLUCOSE 112*  --  101*  BUN 17  --  16  CREATININE 0.90 0.88 0.92  CALCIUM  --   --  8.1*    PT/INR:  Recent Labs  05/10/15 1426  LABPROT 17.5*  INR 1.43   ABG    Component Value Date/Time   PHART 7.340* 05/10/2015 1906   HCO3 24.0 05/10/2015 1906   TCO2 24 05/10/2015 2124   ACIDBASEDEF 2.0 05/10/2015 1906   O2SAT 95.0 05/10/2015 1906   CBG (last 3)   Recent Labs  05/11/15 0240  05/11/15 0349 05/11/15 0456  GLUCAP 99 106* 99    Assessment/Plan: S/P Procedure(s) (LRB): CORONARY ARTERY BYPASS GRAFTING (CABG), ON PUMP, TIMES FOUR, USING LEFT INTERNAL MAMMARY ARTERY, BILATERAL GREATER SAPHENOUS VEINS HARVESTED ENDOSCOPICALLY, WITH CORONARY ENDARTERECTOMY (N/A) TRANSESOPHAGEAL ECHOCARDIOGRAM (TEE) (N/A) Mobilize Diuresis Diabetes control d/c tubes/lines See progression orders start plavix for endarterectomy  Zosyn for oropharyngeal injury   LOS: 1 day    [name] 05/11/2015

## 2015-05-12 ENCOUNTER — Inpatient Hospital Stay (HOSPITAL_COMMUNITY): Payer: Medicare Other

## 2015-05-12 LAB — GLUCOSE, CAPILLARY
Glucose-Capillary: 115 mg/dL — ABNORMAL HIGH (ref 65–99)
Glucose-Capillary: 123 mg/dL — ABNORMAL HIGH (ref 65–99)
Glucose-Capillary: 130 mg/dL — ABNORMAL HIGH (ref 65–99)
Glucose-Capillary: 148 mg/dL — ABNORMAL HIGH (ref 65–99)
Glucose-Capillary: 80 mg/dL (ref 65–99)

## 2015-05-12 LAB — BASIC METABOLIC PANEL
Anion gap: 10 (ref 5–15)
BUN: 23 mg/dL — ABNORMAL HIGH (ref 6–20)
CO2: 23 mmol/L (ref 22–32)
Calcium: 8.5 mg/dL — ABNORMAL LOW (ref 8.9–10.3)
Chloride: 101 mmol/L (ref 101–111)
Creatinine, Ser: 1.2 mg/dL (ref 0.61–1.24)
GFR calc Af Amer: >60 mL/min (ref >60)
GFR calc non Af Amer: 57 mL/min — ABNORMAL LOW (ref >60)
Glucose, Bld: 144 mg/dL — ABNORMAL HIGH (ref 65–99)
Potassium: 4.1 mmol/L (ref 3.5–5.1)
Sodium: 134 mmol/L — ABNORMAL LOW (ref 135–145)

## 2015-05-12 LAB — CBC
HCT: 25.4 % — ABNORMAL LOW (ref 39.0–52.0)
Hemoglobin: 8.8 g/dL — ABNORMAL LOW (ref 13.0–17.0)
MCH: 32.7 pg (ref 26.0–34.0)
MCHC: 34.6 g/dL (ref 30.0–36.0)
MCV: 94.4 fL (ref 78.0–100.0)
Platelets: ADEQUATE 10*3/uL (ref 150–400)
RBC: 2.69 MIL/uL — ABNORMAL LOW (ref 4.22–5.81)
RDW: 13.8 % (ref 11.5–15.5)
WBC: 8.3 10*3/uL (ref 4.0–10.5)

## 2015-05-12 MED ORDER — POTASSIUM CHLORIDE 10 MEQ/50ML IV SOLN
10.0000 meq | INTRAVENOUS | Status: AC
  Administered 2015-05-12 (×2): 10 meq via INTRAVENOUS
  Filled 2015-05-12 (×2): qty 50

## 2015-05-12 MED ORDER — SODIUM CHLORIDE 0.9 % IV SOLN: 250.0000 mL | INTRAVENOUS | Status: DC | PRN

## 2015-05-12 MED ORDER — AMIODARONE HCL IN DEXTROSE 360-4.14 MG/200ML-% IV SOLN
60.0000 mg/h | INTRAVENOUS | Status: AC
  Administered 2015-05-12: 60 mg/h via INTRAVENOUS
  Filled 2015-05-12 (×2): qty 200

## 2015-05-12 MED ORDER — SODIUM CHLORIDE 0.9 % IJ SOLN: 3.0000 mL | INTRAMUSCULAR | Status: DC | PRN

## 2015-05-12 MED ORDER — AMIODARONE HCL IN DEXTROSE 360-4.14 MG/200ML-% IV SOLN
30.0000 mg/h | INTRAVENOUS | Status: DC
  Administered 2015-05-12: 30 mg/h via INTRAVENOUS
  Filled 2015-05-12 (×3): qty 200

## 2015-05-12 MED ORDER — FE FUMARATE-B12-VIT C-FA-IFC PO CAPS
1.0000 | ORAL_CAPSULE | Freq: Three times a day (TID) | ORAL | Status: DC
  Administered 2015-05-12 – 2015-05-15 (×10): 1 via ORAL
  Filled 2015-05-12 (×13): qty 1

## 2015-05-12 MED ORDER — ALUM & MAG HYDROXIDE-SIMETH 200-200-20 MG/5ML PO SUSP: 15.0000 mL | ORAL | Status: DC | PRN

## 2015-05-12 MED ORDER — FUROSEMIDE 40 MG PO TABS: 40.0000 mg | ORAL_TABLET | Freq: Every day | ORAL | Status: DC

## 2015-05-12 MED ORDER — INFLUENZA VAC SPLIT QUAD 0.5 ML IM SUSY
0.5000 mL | PREFILLED_SYRINGE | INTRAMUSCULAR | Status: AC
  Administered 2015-05-15: 0.5 mL via INTRAMUSCULAR
  Filled 2015-05-12 (×2): qty 0.5

## 2015-05-12 MED ORDER — HYDROMORPHONE HCL 2 MG PO TABS
1.0000 mg | ORAL_TABLET | ORAL | Status: DC | PRN
  Administered 2015-05-12 – 2015-05-13 (×3): 1 mg via ORAL
  Filled 2015-05-12 (×3): qty 1

## 2015-05-12 MED ORDER — GUAIFENESIN ER 600 MG PO TB12: 600.0000 mg | ORAL_TABLET | Freq: Two times a day (BID) | ORAL | Status: DC | PRN

## 2015-05-12 MED ORDER — ALPRAZOLAM 0.25 MG PO TABS
0.2500 mg | ORAL_TABLET | Freq: Two times a day (BID) | ORAL | Status: DC | PRN
  Administered 2015-05-12 – 2015-05-15 (×4): 0.25 mg via ORAL
  Filled 2015-05-12 (×4): qty 1

## 2015-05-12 MED ORDER — MOVING RIGHT ALONG BOOK
Freq: Once | Status: AC
  Administered 2015-05-12: 13:00:00
  Filled 2015-05-12: qty 1

## 2015-05-12 MED ORDER — PNEUMOCOCCAL VAC POLYVALENT 25 MCG/0.5ML IJ INJ
0.5000 mL | INJECTION | INTRAMUSCULAR | Status: AC
  Administered 2015-05-15: 0.5 mL via INTRAMUSCULAR
  Filled 2015-05-12 (×2): qty 0.5

## 2015-05-12 MED ORDER — ALPRAZOLAM 0.25 MG PO TABS: 0.2500 mg | ORAL_TABLET | Freq: Three times a day (TID) | ORAL | Status: DC | PRN

## 2015-05-12 MED ORDER — INSULIN ASPART 100 UNIT/ML ~~LOC~~ SOLN
0.0000 [IU] | Freq: Three times a day (TID) | SUBCUTANEOUS | Status: DC
  Administered 2015-05-13: 2 [IU] via SUBCUTANEOUS

## 2015-05-12 MED ORDER — SODIUM CHLORIDE 0.9 % IJ SOLN
3.0000 mL | Freq: Two times a day (BID) | INTRAMUSCULAR | Status: DC
  Administered 2015-05-13 – 2015-05-14 (×2): 3 mL via INTRAVENOUS

## 2015-05-12 MED ORDER — FUROSEMIDE 40 MG PO TABS
40.0000 mg | ORAL_TABLET | Freq: Every day | ORAL | Status: DC
  Administered 2015-05-13 – 2015-05-15 (×3): 40 mg via ORAL
  Filled 2015-05-12 (×3): qty 1

## 2015-05-12 MED ORDER — AMIODARONE LOAD VIA INFUSION
150.0000 mg | Freq: Once | INTRAVENOUS | Status: AC
  Administered 2015-05-12: 150 mg via INTRAVENOUS
  Filled 2015-05-12: qty 83.34

## 2015-05-12 MED ORDER — POTASSIUM CHLORIDE CRYS ER 20 MEQ PO TBCR
20.0000 meq | EXTENDED_RELEASE_TABLET | Freq: Every day | ORAL | Status: DC
  Administered 2015-05-12 – 2015-05-15 (×4): 20 meq via ORAL
  Filled 2015-05-12 (×4): qty 1

## 2015-05-12 MED ORDER — ATORVASTATIN CALCIUM 20 MG PO TABS
20.0000 mg | ORAL_TABLET | Freq: Every day | ORAL | Status: DC
  Administered 2015-05-12 – 2015-05-14 (×3): 20 mg via ORAL
  Filled 2015-05-12 (×3): qty 1

## 2015-05-12 MED ORDER — MAGNESIUM HYDROXIDE 400 MG/5ML PO SUSP: 30.0000 mL | Freq: Every day | ORAL | Status: DC | PRN

## 2015-05-12 NOTE — Care Management Note (Signed)
Case Management Note  Patient Details  Name: [name] MRN: [id number] Date of Birth: 05/15/1941  Subjective/Objective:       Lives at home alone.  Supportive family, has daughters and a friend.  Between those one will be staying with him 24/7 initially.               Action/Plan:   Expected Discharge Date:                  Expected Discharge Plan:  Home/Self Care  In-House Referral:     Discharge planning Services  CM Consult  Post Acute Care Choice:    Choice offered to:     DME Arranged:    DME Agency:     HH Arranged:    HH Agency:     Status of Service:  In process, will continue to follow  Medicare Important Message Given:    Date Medicare IM Given:    Medicare IM give by:    Date Additional Medicare IM Given:    Additional Medicare Important Message give by:     If discussed at Long Length of Stay Meetings, dates discussed:    Additional Comments:  Vangie BickerBrown, Kalani Baray Jane, RN 05/12/2015, 8:57 AM

## 2015-05-12 NOTE — Progress Notes (Addendum)
Patient concerned that he is having panic attacks he states when he wakes up he "feels Panicked " and feels short of breath. Pt states he does take medication for this at home. Dr Donata ClayVan Trigt paged awaiting call back. Will monitor patient. Raneshia Derick, Randall AnKristin Jessup RN Message left with OR staff for Dr. Donata ClayVan Trigt will monitor patient. Brooklynne Pereida, Randall AnKristin Jessup RN

## 2015-05-12 NOTE — Progress Notes (Signed)
CARDIAC REHAB PHASE I   PRE:  Rate/Rhythm: 70 SR with PACs    BP: sitting 94/61    SaO2:   MODE:  Ambulation: to bed   POST:  Rate/Rhythm: 75     BP: sitting 125/66     SaO2: 99 RA  Pt feeling very tired. Has been in recliner, eating. Wanted to go to bed for nap. Assisted to bed. SOB upon lying down but VSS. Pt exhausted. Will allow to rest, nursing will walk later.   [id number]  Elissa LovettReeve, Camyah Pultz FarmingtonKristan CES, ACSM 05/12/2015 2:05 PM

## 2015-05-12 NOTE — Progress Notes (Signed)
05/12/2015 1250  Pt. Transferred to room 2w21. rn in room to receive patient. Telemetry safety checks performed. VSS. Family at bedside.  Karysa Heft, Blanchard KelchStephanie Ingold

## 2015-05-12 NOTE — Progress Notes (Addendum)
[facility] DAILY ICU PROGRESS NOTE                   [address]            [place] [id number]          [phone]   2 Days Post-Op Procedure(s) (LRB): CORONARY ARTERY BYPASS GRAFTING (CABG), ON PUMP, TIMES FOUR, USING LEFT INTERNAL MAMMARY ARTERY, BILATERAL GREATER SAPHENOUS VEINS HARVESTED ENDOSCOPICALLY, WITH CORONARY ENDARTERECTOMY (N/A) TRANSESOPHAGEAL ECHOCARDIOGRAM (TEE) (N/A)  Total Length of Stay:  LOS: 2 days   Subjective:  Mr. Laural Williamson states he feels bad all over.  He does not have specific complaints.  He developed rapid Atrial Fibrillation overnight.  He has converted to NSR with use of Amiodarone.  He has ambulated.  No BM  Objective: Vital signs in last 24 hours: Temp:  [97.7 F (36.5 C)-99.9 F (37.7 C)] 97.7 F (36.5 C) (11/02 0735) Pulse Rate:  [66-77] 68 (11/02 0800) Cardiac Rhythm:  [-] Normal sinus rhythm (11/02 0800) Resp:  [11-31] 22 (11/02 0800) BP: (90-122)/(42-74) 105/65 mmHg (11/02 0800) SpO2:  [93 %-100 %] 100 % (11/02 0800) Arterial Line BP: (83-152)/(39-89) 112/50 mmHg (11/02 0800) Weight:  [227 lb 1.2 oz (103 kg)] 227 lb 1.2 oz (103 kg) (11/02 0600)  Filed Weights   05/10/15 0602 05/11/15 0500 05/12/15 0600  Weight: 219 lb 9 oz (99.593 kg) 214 lb 4.6 oz (97.2 kg) 227 lb 1.2 oz (103 kg)    Weight change: 12 lb 12.6 oz (5.8 kg)   Hemodynamic parameters for last 24 hours: PAP: (15-23)/(3-8) 19/3 mmHg CO:  [5.3 L/min] 5.3 L/min CI:  [2.4 L/min/m2] 2.4 L/min/m2  Intake/Output from previous day: 11/01 0701 - 11/02 0700 In: 1505.2 [I.V.:1142.7; IV Piggyback:362.5] Out: 2125 [Urine:1825; Chest Tube:300]  Intake/Output this shift: Total I/O In: 20 [I.V.:20] Out: 150 [Urine:75; Chest Tube:75]  Current Meds: Scheduled Meds: . acetaminophen  1,000 mg Oral 4 times per day   Or  . acetaminophen (TYLENOL) oral liquid 160 mg/5 mL  1,000 mg Per Tube 4 times per day  . aspirin EC  81 mg Oral Daily  . bisacodyl  10 mg Oral Daily   Or  . bisacodyl  10 mg Rectal Daily  . chlorhexidine  15 mL Mouth/Throat QID  . Chlorhexidine Gluconate Cloth  6 each Topical Daily  . clopidogrel  75 mg Oral Daily  . docusate sodium  200 mg Oral Daily  . ferrous fumarate-b12-vitamic C-folic acid  1 capsule Oral TID PC  . Influenza vac split quadrivalent PF  0.5 mL Intramuscular Tomorrow-1000  . loratadine  10 mg Oral Daily  . metoprolol tartrate  12.5 mg Oral BID  . mupirocin ointment  1 application Nasal BID  . pantoprazole  40 mg Oral Daily  . pneumococcal 23 valent vaccine  0.5 mL Intramuscular Tomorrow-1000  . potassium chloride  10 mEq Intravenous Q1 Hr x 2  . simvastatin  40 mg Oral QHS  . sodium chloride  3 mL Intravenous Q12H   Continuous Infusions: . sodium chloride    . sodium chloride Stopped (05/11/15 1900)  . sodium chloride Stopped (05/11/15 1900)  . amiodarone 60 mg/hr (05/12/15 0800)   Followed by  . amiodarone    . lactated ringers    . lactated ringers 20 mL/hr at 05/12/15 0800   PRN Meds:.sodium chloride, HYDROmorphone, metoprolol, ondansetron (ZOFRAN) IV, sodium chloride, traMADol  General appearance: alert, cooperative and no distress Heart: regular rate and rhythm Lungs:  clear to auscultation bilaterally Abdomen: soft, non-tender; bowel sounds normal; no masses,  no organomegaly Extremities: edema trace, ecchymosis RLE Wound: clean and dry  Lab Results: CBC: Recent Labs  05/11/15 1740 05/11/15 1748 05/12/15 0544  WBC 9.9  --  8.3  HGB 8.7* 8.5* 8.8*  HCT 25.7* 25.0* 25.4*  PLT 145*  --  PENDING   BMET:  Recent Labs  05/11/15 0412  05/11/15 1748 05/12/15 0544  NA 141  --  138 134*  K 4.2  --  3.5 4.1  CL 110  --  105 101  CO2 24  --   --  23  GLUCOSE 101*  --  136* 144*  BUN 16  --  18 23*  CREATININE 0.92  < > 1.10 1.20  CALCIUM 8.1*  --   --  8.5*  < > = values in this interval not displayed.  PT/INR:  Recent Labs  05/10/15 1426  LABPROT 17.5*  INR 1.43   Radiology: Dg  Chest Port 1 View  05/12/2015  CLINICAL DATA:  Chest tube.  CABG. EXAM: PORTABLE CHEST 1 VIEW COMPARISON:  05/11/2015 . FINDINGS: Interim removal of Swan-Ganz catheter. Right IJ line, mediastinal drainage catheter, left chest tube in stable position. Cardiomegaly. Prior CABG. Low lung volumes with mild basilar atelectasis and/or infiltrates. No interim change. No pleural effusion or pneumothorax. IMPRESSION: 1. Interim removal Swan-Ganz catheter. Remaining lines and tubes in stable position. No pneumothorax. 2. Prior CABG. Stable cardiomegaly. No pulmonary venous congestion. 3.  Low lung volumes with small bibasilar atelectasis . Electronically Signed   By: Maisie Fus  Register   On: 05/12/2015 07:32     Assessment/Plan: S/P Procedure(s) (LRB): CORONARY ARTERY BYPASS GRAFTING (CABG), ON PUMP, TIMES FOUR, USING LEFT INTERNAL MAMMARY ARTERY, BILATERAL GREATER SAPHENOUS VEINS HARVESTED ENDOSCOPICALLY, WITH CORONARY ENDARTERECTOMY (N/A) TRANSESOPHAGEAL ECHOCARDIOGRAM (TEE) (N/A)  1. CV- Rapid Atrial Fibrillation this morning, currently NSR- continue Amiodarone drip, lopressor 2. Pulm- wean oxygen as tolerated, mild atelectasis on CXR, d/c chest tube today 3. Renal- creatinine elevated at 1.20, mildly hypervolemic continue Lasix 4. Expected post operative blood loss anemia- mild Hgb at 8.8 5. Dispo- patient with rapid A. Fib, maintaining NSR currently- continue IV Amiodarone today will transition to PO tomorrow, d/c chest tube, foley, arterial line... Transfer to 2W possibly later today     Williamson, Douglas 05/12/2015 8:30 AM  Continue IV amiodarone for 24 hours and then transitioned to oral dosing Ready for transfer to step down Continue oral Plavix  a month  after his coronary endarterectomy--with aspirin 81 mg patient examined and medical record reviewed,agree with above note. Kathlee Nations Trigt III 05/12/2015

## 2015-05-12 NOTE — Progress Notes (Signed)
05/12/2015 0900 Verbal order received to leave central line in place at transfer due to amiodarone infusion. Orders enacted. Pt. Updated on plan of care.   Taz Vanness, Blanchard KelchStephanie Ingold

## 2015-05-12 NOTE — Op Note (Signed)
NAMLazarus Gowda:  Boyar, Perkins              ACCOUNT NO.:  [id number]  MEDICAL RECORD NO.:  [id number]  LOCATION:  2S11C                        FACILITY:  [facility]  PHYSICIAN:  Douglas PernaPeter Van Williamson, M.D.  DATE OF BIRTH:  1941/01/13  DATE OF PROCEDURE:  05/11/2015 DATE OF DISCHARGE:                              OPERATIVE REPORT   OPERATION: 1. Coronary artery bypass grafting x4 (left internal mammary artery,     LAD, saphenous vein graft to diagonal, saphenous vein graft to     obtuse marginal, saphenous vein graft to posterior descending). 2. Coronary endarterectomy of circumflex marginal. 3. Endoscopic vein harvest of bilateral greater saphenous vein.  SURGEON:  Douglas PernaPeter Van Williamson, M.D.  ASSISTANT:  Doree Fudgeonielle Zimmerman, P.A.  PREOPERATIVE DIAGNOSIS:  Severe three-vessel coronary artery disease, positive stress test, class III angina.[name].  POSTOPERATIVE DIAGNOSIS:  Severe three-vessel coronary artery disease, positive stress test, class III angina.  INDICATION:  The patient is a 74 year old Caucasian male, who has had symptoms of decrease in the exercise tolerance and a positive stress test for ischemia.  Cardiac cath physician's demonstrated severe multivessel coronary disease and he was referred for surgical coronary revascularization.  His LV function was fairly well preserved.  Prior to surgery, I examined the patient in the office and reviewed the results of his cardiac catheterization and echocardiogram and discussed the diagnosis of severe multivessel coronary artery disease with a strongly positive stress test.  I told the patient I agreed with his cardiologist recommendation for multivessel CABG as the best long-term therapy of his coronary stenosis.  I discussed the details of the procedure including the use of general anesthesia and cardiopulmonary bypass, the location of the surgical incisions, and the expected postoperative hospital recovery.  I discussed with him the potential risks  of the surgery including the risks of bleeding, blood transfusion, stroke, MI, postop for pulmonary problems including pleural effusion, wound infection, sepsis, multisystem failure, and death.  After reviewing these issues, he demonstrated his understanding and agreed to proceed with surgery under what I felt was an informed consent.  OPERATIVE FINDINGS: 1. Severe coronary disease, poor targets with heavily diseased     vessels, circumflex required coronary endarterectomy. 2. No packed cell transfusions required for the surgery. 3. Preserved LV systolic function, following termination from     cardiopulmonary bypass.  DESCRIPTION OF PROCEDURE:  The patient was brought to the operating room and placed supine on the operating table.  General anesthesia was induced under invasive hemodynamic monitoring.  The chest, abdomen, and legs were prepped with Betadine and draped as a sterile field.  A transesophageal echo probe was placed by the Anesthesia Team.  A proper time-out was performed.  A sternal incision was made, as the saphenous vein was harvested endoscopically from both the right leg and the left leg.  The left internal mammary artery was harvested as a pedicle graft from its origin at the subclavian vessels.  It was a good vessel, 1.5 mm with excellent flow.  The sternal retractor was placed and the pericardium was opened and suspended.  Pursestrings were placed in the ascending aorta and right atrium and after the heparin had been administered, the patient was cannulated and  placed on cardiopulmonary bypass.  The coronaries were identified for grafting.  The targets were suboptimal, but graftable.  The mammary artery and vein grafts were prepared for the distal anastomoses.  Cardioplegia cannulas were placed both antegrade and retrograde cold blood cardioplegia.  The patient was cooled to 32 degrees.  The aortic crossclamp was applied.  One liter of cold blood cardioplegia was  delivered in split doses between the antegrade aortic and retrograde coronary sinus catheters.  There was good cardioplegic arrest and septal temperature dropped less than 12 degrees. Cardioplegia was delivered every 20 minutes while the crossclamp was in place.  The distal coronary anastomoses were performed.  The first distal anastomosis was the posterior descending branch of the right coronary. This was a 1.5-mm vessel proximal 95% stenosis.  A reverse saphenous vein was sewn end-to-side with running 7-0 Prolene.  There was good flow through the graft.  Cardioplegia was redosed.  The second distal anastomosis was the circumflex marginal branch.  This was a 1.5-mm vessel, which was heavily calcified its entire length down to a very small vessel.  The arteriotomy was made in the plaque, however, the wall of the vessel was not suitable for anastomosis. Therefore, a coronary endarterectomy was performed removing the plaque after developing a plane between the adventitia and the intimal plaque. The plaque was removed from a feathered tip distally and proximally. The vessel was irrigated with cold saline.  The anastomosis with the vein was sewn end-to-side with running 7-0 Prolene with good flow through the graft.  Cardioplegia was redosed.  The third distal anastomosis was to the diagonal branch and the LAD. This area was also heavily diseased proximally.  The anastomosis was placed distally with the vessels 1.2 mm2.  A reverse saphenous vein was sewn end-to-side with running 7-0 Prolene with good flow through the graft.  Cardioplegia was redosed.  The fourth distal anastomosis was to the distal third of the LAD.  The LAD also had diffuse calcified disease, but a suitable site was located for the anastomosis with the mammary artery brought through the pericardial opening.  The running 8-0 Prolene was used to construct the anastomosis and after briefly releasing the pedicle bulldog on  the mammary artery, there was excellent flow through the anastomosis and the bulldog was reapplied.  The pedicle was secured to the epicardium and cardioplegia was redosed.  While the crossclamp was still in place, 3 proximal vein anastomoses were performed on the ascending aorta with a 4.5 mm punch and running 6- 0 Prolene.  Prior to tying down the final proximal anastomosis, air was vented from the coronaries with a dose of retrograde warm blood cardioplegia.  The crossclamp was removed.  The heart was cardioverted back to a regular rhythm.  The proximal distal anastomoses were checked and found to be hemostatic.  The vein grafts had excellent flow.  The patient was rewarmed and reperfused. Temporary pacing wires were applied.  The lungs were expanded and the ventilator was resumed.  The patient was placed on low-dose renal dose dopamine and weaned successfully off cardiopulmonary bypass without difficulty.  Echocardiogram showed normal LV systolic function. Protamine was administered.  The patient remained hemodynamically stable.  The cannula was removed.  After heparin was reversed with the protamine, there was still diffuse bleeding.  The patient had been on Plavix prior to surgery from a previous stent.  He was given 1 unit of platelets and 1 unit FFP with improved coagulation function.  The superior pericardial fat  was closed over the aorta.  Anterior mediastinal left pleural chest tubes were placed and brought through separate incisions.  The sternum was closed with interrupted steel wire.  He remained stable.  The pectoralis fascia was closed with a running #1 Vicryl.  The subcutaneous and skin layers were closed in running Vicryl.  Sterile dressings were applied.  Total cardiopulmonary bypass time was 130 minutes.     [name], M.D.     PV/MEDQ  D:  05/11/2015  T:  05/12/2015  Job:  [id number]  cc:   Antonieta Iba, MD

## 2015-05-12 NOTE — Progress Notes (Signed)
Patient ambulated about 1/2 way from 2 south to room 2 west 21 and was transported via wheel chair remainder of way to room. Pt placed on telemetry, and called to CCMD vital signs obtained pt in in chair will monitor . Charmon Thorson, Randall AnKristin Jessup RN

## 2015-05-13 ENCOUNTER — Inpatient Hospital Stay (HOSPITAL_COMMUNITY): Payer: Medicare Other

## 2015-05-13 LAB — CBC
HCT: 25.6 % — ABNORMAL LOW (ref 39.0–52.0)
Hemoglobin: 8.6 g/dL — ABNORMAL LOW (ref 13.0–17.0)
MCH: 31.4 pg (ref 26.0–34.0)
MCHC: 33.6 g/dL (ref 30.0–36.0)
MCV: 93.4 fL (ref 78.0–100.0)
Platelets: 131 10*3/uL — ABNORMAL LOW (ref 150–400)
RBC: 2.74 MIL/uL — ABNORMAL LOW (ref 4.22–5.81)
RDW: 13.6 % (ref 11.5–15.5)
WBC: 7 10*3/uL (ref 4.0–10.5)

## 2015-05-13 LAB — BASIC METABOLIC PANEL
Anion gap: 7 (ref 5–15)
BUN: 23 mg/dL — ABNORMAL HIGH (ref 6–20)
CO2: 25 mmol/L (ref 22–32)
Calcium: 8.2 mg/dL — ABNORMAL LOW (ref 8.9–10.3)
Chloride: 102 mmol/L (ref 101–111)
Creatinine, Ser: 1.09 mg/dL (ref 0.61–1.24)
GFR calc Af Amer: >60 mL/min (ref >60)
GFR calc non Af Amer: >60 mL/min (ref >60)
Glucose, Bld: 141 mg/dL — ABNORMAL HIGH (ref 65–99)
Potassium: 4.4 mmol/L (ref 3.5–5.1)
Sodium: 134 mmol/L — ABNORMAL LOW (ref 135–145)

## 2015-05-13 LAB — GLUCOSE, CAPILLARY: Glucose-Capillary: 120 mg/dL — ABNORMAL HIGH (ref 65–99)

## 2015-05-13 MED ORDER — AMIODARONE HCL 200 MG PO TABS
400.0000 mg | ORAL_TABLET | Freq: Two times a day (BID) | ORAL | Status: DC
  Administered 2015-05-13 – 2015-05-15 (×5): 400 mg via ORAL
  Filled 2015-05-13 (×5): qty 2

## 2015-05-13 MED ORDER — MORPHINE SULFATE (PF) 4 MG/ML IV SOLN
4.0000 mg | Freq: Once | INTRAVENOUS | Status: AC
  Administered 2015-05-13: 4 mg via INTRAVENOUS
  Filled 2015-05-13: qty 1

## 2015-05-13 MED ORDER — HYDROMORPHONE HCL 2 MG PO TABS: 2.0000 mg | ORAL_TABLET | ORAL | Status: DC | PRN

## 2015-05-13 NOTE — Progress Notes (Addendum)
      [address]       [place] [id number]             [phone]        3 Days Post-Op Procedure(s) (LRB): CORONARY ARTERY BYPASS GRAFTING (CABG), ON PUMP, TIMES FOUR, USING LEFT INTERNAL MAMMARY ARTERY, BILATERAL GREATER SAPHENOUS VEINS HARVESTED ENDOSCOPICALLY, WITH CORONARY ENDARTERECTOMY (N/A) TRANSESOPHAGEAL ECHOCARDIOGRAM (TEE) (N/A)  Subjective: Patient had a "coughing spell" last evening and then had a lot of incisional pain. He had a bowel movement last evening.He is eating breakfast this am and no specific complaints at this time.  Objective: Vital signs in last 24 hours: Temp:  [97.5 F (36.4 C)-98.2 F (36.8 C)] 98.2 F (36.8 C) (11/03 0354) Pulse Rate:  [59-77] 72 (11/03 0354) Cardiac Rhythm:  [-] Bundle branch block;Heart block (11/02 1927) Resp:  [17-28] 18 (11/03 0354) BP: (94-136)/(59-75) 110/63 mmHg (11/03 0354) SpO2:  [70 %-100 %] 94 % (11/03 0354) Arterial Line BP: (101-148)/(49-63) 134/61 mmHg (11/02 0945) Weight:  [221 lb 9 oz (100.5 kg)] 221 lb 9 oz (100.5 kg) (11/03 0354)  Pre op weight 99 kg Current Weight  05/13/15 221 lb 9 oz (100.5 kg)      Intake/Output from previous day: 11/02 0701 - 11/03 0700 In: 519 [I.V.:419; IV Piggyback:100] Out: 1145 [Urine:1045; Chest Tube:100]   Physical Exam:  Cardiovascular: RRR, no murmur Pulmonary: Slightly diminished at bases; no rales, wheezes, or rhonchi. Abdomen: Soft, non tender, bowel sounds present. Extremities: Mild bilateral lower extremity edema. Ecchymosis right thigh (has had since cath) Wounds: Clean and dry.  No erythema or signs of infection.  Lab Results: CBC: Recent Labs  05/12/15 0544 05/13/15 0324  WBC 8.3 7.0  HGB 8.8* 8.6*  HCT 25.4* 25.6*  PLT PLATELET CLUMPS NOTED ON SMEAR, COUNT APPEARS ADEQUATE 131*   BMET:  Recent Labs  05/12/15 0544 05/13/15 0324  NA 134* 134*  K 4.1 4.4  CL 101 102  CO2 23 25  GLUCOSE 144* 141*  BUN 23* 23*  CREATININE  1.20 1.09  CALCIUM 8.5* 8.2*    PT/INR:  Lab Results  Component Value Date   INR 1.43 05/10/2015   INR 1.09 05/06/2015   INR 1.0 04/27/2015   ABG:  INR: Will add last result for INR, ABG once components are confirmed Will add last 4 CBG results once components are confirmed  Assessment/Plan:  1. CV - Previous a fib with RVR. SR, first degree heart block this am. On Amiodarone drip,Lopressor 12.5 mg bid and Plavix 75 mg daily (coronary endarterectomy). Will start oral Amiodarone and stop drip. Hope to start low dose ACE soon (when BP allows). 2.  Pulmonary - On room air. CXR appears to show no pneumothorax, small pleural effusions, and cardiomegaly. Encourage incentive spirometer 3. Volume Overload - On Lasix 40 mg daily 4.  Acute blood loss anemia - H and H stable at 8.6 and 25.6. On Trinsicon 5. Thrombocytopenia-platelets 131,000 6. Remove EPW in am 7. CBGs 115/80/120. Pre op HGA1C 5.6. Will stop accu checks and SS PRN. 8. Likely home this weekend  ZIMMERMAN,DONIELLE MPA-C 05/13/2015,7:32 AM  patient examined and medical record reviewed,agree with above note. Kathlee Nationseter Van Trigt III 05/13/2015

## 2015-05-13 NOTE — Progress Notes (Signed)
CARDIAC REHAB PHASE I   PRE:  Rate/Rhythm: 66 SR  BP:  Supine: 117/70  Sitting:   Standing:    SaO2: 96%RA  ear  MODE:  Ambulation: 150 ft   POST:  Rate/Rhythm: 88 SR  BP:  Supine:   Sitting: 117/64  Standing:    SaO2: 100%RA  Ear 1305-1330 Pt walked 150 ft on RA with rolling walker and asst x 1 with slow steady gait. Encouraged pt to not put pressure on arms. Pt c/o lightheadedness that last through walk. To bed after walk. Tired by end of walk. Family in room.   Luetta Nuttingharlene Caylor Cerino, RN BSN  05/13/2015 1:26 PM

## 2015-05-13 NOTE — Progress Notes (Signed)
Utilization review completed.  

## 2015-05-13 NOTE — Discharge Instructions (Signed)
Activity: 1.May walk up steps                2.No lifting more than ten pounds for four weeks.                 3.No driving for four weeks.                4.Stop any activity that causes chest pain, shortness of breath, dizziness, sweating or excessive weakness.                5.Avoid straining.                6.Continue with your breathing exercises daily.  Diet: Low sugar and low salt    Coronary Artery Bypass Grafting, Care After Refer to this sheet in the next few weeks. These instructions provide you with information on caring for yourself after your procedure. Your health care provider may also give you more specific instructions. Your treatment has been planned according to current medical practices, but problems sometimes occur. Call your health care provider if you have any problems or questions after your procedure. WHAT TO EXPECT AFTER THE PROCEDURE Recovery from surgery will be different for everyone. Some people feel well after 3 or 4 weeks, while for others it takes longer. After your procedure, it is typical to have the following:  Nausea and a lack of appetite.   Constipation.  Weakness and fatigue.   Depression or irritability.   Pain or discomfort at your incision site. HOME CARE INSTRUCTIONS  Take medicines only as directed by your health care provider. Do not stop taking medicines or start any new medicines without first checking with your health care provider.  Take your pulse as directed by your health care provider.  Perform deep breathing as directed by your health care provider. If you were given a device called an incentive spirometer, use it to practice deep breathing several times a day. Support your chest with a pillow or your arms when you take deep breaths or cough.  Keep incision areas clean, dry, and protected. Remove or change any bandages (dressings) only as directed by your health care provider. You may have skin adhesive strips over the  incision areas. Do not take the strips off. They will fall off on their own.  Check incision areas daily for any swelling, redness, or drainage.  If incisions were made in your legs, do the following:  Avoid crossing your legs.   Avoid sitting for long periods of time. Change positions every 30 minutes.   Elevate your legs when you are sitting.  Wear compression stockings as directed by your health care provider. These stockings help keep blood clots from forming in your legs.  Take showers once your health care provider approves. Until then, only take sponge baths. Pat incisions dry. Do not rub incisions with a washcloth or towel. Do not take baths, swim, or use a hot tub until your health care provider approves.  Eat foods that are high in fiber, such as raw fruits and vegetables, whole grains, beans, and nuts. Meats should be lean cut. Avoid canned, processed, and fried foods.  Drink enough fluid to keep your urine clear or pale yellow.  Weigh yourself every day. This helps identify if you are retaining fluid that may make your heart and lungs work harder.  Rest and limit activity as directed by your health care provider. You may be instructed to:  Stop any activity at  once if you have chest pain, shortness of breath, irregular heartbeats, or dizziness. Get help right away if you have any of these symptoms.  Move around frequently for short periods or take short walks as directed by your health care provider. Increase your activities gradually. You may need physical therapy or cardiac rehabilitation to help strengthen your muscles and build your endurance.  Avoid lifting, pushing, or pulling anything heavier than 10 lb (4.5 kg) for at least 6 weeks after surgery.  Do not drive until your health care provider approves.  Ask your health care provider when you may return to work.  Ask your health care provider when you may resume sexual activity.  Keep all follow-up visits as  directed by your health care provider. This is important. SEEK MEDICAL CARE IF:  You have swelling, redness, increasing pain, or drainage at the site of an incision.  You have a fever.  You have swelling in your ankles or legs.  You have pain in your legs.   You gain 2 or more pounds (0.9 kg) a day.  You are nauseous or vomit.  You have diarrhea. SEEK IMMEDIATE MEDICAL CARE IF:  You have chest pain that goes to your jaw or arms.  You have shortness of breath.   You have a fast or irregular heartbeat.   You notice a "clicking" in your breastbone (sternum) when you move.   You have numbness or weakness in your arms or legs.  You feel dizzy or light-headed.  MAKE SURE YOU:  Understand these instructions.  Will watch your condition.  Will get help right away if you are not doing well or get worse.   This information is not intended to replace advice given to you by your health care provider. Make sure you discuss any questions you have with your health care provider.   Document Released: 01/13/2005 Document Revised: 07/17/2014 Document Reviewed: 12/03/2012 Elsevier Interactive Patient Education 2016 [employer]  and Low fat, Low cholesterol diet  Wound Care: May shower.  Clean wounds with mild soap and water daily. Contact the office at [phone] if any problems arise.

## 2015-05-13 NOTE — Progress Notes (Signed)
Pt ambulated approx 150 ft using RW, slow steady gait, no complaints.  To bed after walk with call bell in reach and daughter at bedside.  Will cont plan of care.

## 2015-05-13 NOTE — Care Management Important Message (Signed)
Important Message  Patient Details  Name: Elijio MilesHerman C Carcamo MRN: [id number] Date of Birth: 06/17/1941   Medicare Important Message Given:  Yes-second notification given    Kyla BalzarineShealy, Marcell Chavarin Abena 05/13/2015, 3:39 PM

## 2015-05-13 NOTE — Discharge Summary (Signed)
Physician Discharge Summary       [address]       [place] [address]             [phone]    Patient ID: [name] MRN: [id number] DOB/AGE: 12/23/1940 74 y.o.  Admit date: 05/10/2015 Discharge date: 05/15/2015  Principle Diagnosis: History of CAD (s/p MI,PCI with DES stent to LAD and RCA)   Discharge Diagnoses:  1. History of hyperlipidemia 2. History of hypertension 3. History of ED 4. ABL anemia 5. Post op atrial fibrillation 6. Mild thrombocytopenia   Procedure (s):  1. Coronary artery bypass grafting x4 (left internal mammary artery, LAD, saphenous vein graft to diagonal, saphenous vein graft to obtuse marginal, saphenous vein graft to posterior descending). 2. Coronary endarterectomy of circumflex marginal. 3. Endoscopic vein harvest of bilateral greater saphenous vein by Dr. [name] on 05/11/2015   History of Presenting Illness: This is a 74 year old Caucasian male with hypertension, hyperlipidemia, and previous history of CAD with stents placed in the LAD and RCA in 2001, 2005. The patient has for several years followed a fairly disciplined walking program on a treadmill, 5 miles per session 5 days a week. Over the past few weeks, he has had shortness of breath with exertion. He was evaluated by pulmonary. There is no history of smoking and apparently PFTs were satisfactory. He underwent a stress test followed by a myocardial perfusion scan which showed anterior wall ischemia. Five days ago he underwent cardiac catheterization by Dr Mariah Milling which demonstrated severe three-vessel CAD with 80-90 percent stenoses in all major territories. LV systolic function was well preserved and LV EDP was normal. The patient was not felt to be candidate for further percutaneous intervention and multivessel CABG was recommended.  The patient had been on Plavix for several years but was stopped proximally 6 days ago for the cardiac catheterization. He did have  some bleeding from his right groin with ecchymoses and a palpable but small hematoma. He has not resumed his Plavix.  The patient denies history of smoking or alcohol consumption. 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 Patient has positive family history of coronary disease in his father and uncles and brother. He lives alone with his daughter close by.  Dr. [name] discussed the need for coronary artery bypass grafting surgery. Potential risks, benefits, and complications of the surgery were discussed with the patient and he agreed to proceed with surgery. Pre operative carotid duplex US showed no significant bilateral internal carotid artery stenosis. Patient underwent a CABG x 4 and coronary artery endarterectomy on 05/11/2015.    Brief Hospital Course:  The patient was extubated the evening of surgery without difficulty. He remained afebrile and hemodynamically stable. He was weaned off of Neo synephrine and Dopamine drips. It should be noted he did have an oropharyngeal mucosa tear from TEE. Theone Murdoch, a line, chest tubes, and foley were removed early in the post operative course. Lopressor was started and titrated accordingly. He went into a fib with RVR. He was put on an Amiodarone drip. He later converted to sinus rhythm. He was volume over loaded and diuresed. He had ABL anemia. He did not require a post op transfusion. His last H and H was stable at 8.6 and 25.6. He also had mild thrombocytopenia. His last platelet count was 131,000. He was weaned off the insulin drip. The patient's glucose remained well controlled. The patient's HGA1C pre op was 5.6. Accu  checks and SS PRN were stopped.  The patient was felt surgically stable for transfer from the ICU to PCTU for further convalescence on 05/12/2015. He continues to progress with cardiac rehab. He was ambulating on room air. He has been tolerating a diet and has had a bowel movement. Epicardial pacing wires will be  removed on 05/14/2015. Chest tube sutures will be removed prior to discharge. The patient is felt surgically stable for discharge today.   Latest Vital Signs: Blood pressure 130/59, pulse 77, temperature 98.4 F (36.9 C), temperature source Oral, resp. rate 18, height 6\' 1"  (1.854 m), weight 228 lb 2.8 oz (103.5 kg), SpO2 96 %.  Physical Exam: Cardiovascular: RRR, no murmur Pulmonary: Slightly diminished at bases; no rales, wheezes, or rhonchi. Abdomen: Soft, non tender, bowel sounds present. Extremities: Mild bilateral lower extremity edema. Ecchymosis right thigh (has had since cath) Wounds: Clean and dry. No erythema or signs of infection.   Discharge Condition: Stable and discharged to home  Recent laboratory studies:  Lab Results  Component Value Date   WBC 7.0 05/13/2015   HGB 8.6* 05/13/2015   HCT 25.6* 05/13/2015   MCV 93.4 05/13/2015   PLT 131* 05/13/2015   Lab Results  Component Value Date   NA 130* 05/15/2015   K 4.3 05/15/2015   CL 95* 05/15/2015   CO2 26 05/15/2015   CREATININE 0.97 05/15/2015   GLUCOSE 114* 05/15/2015    Diagnostic Studies: Dg Chest 2 View  05/13/2015  CLINICAL DATA:  CABG. EXAM: CHEST  2 VIEW COMPARISON:  05/12/2015. FINDINGS: Right IJ sheath in good anatomic position. Interim removal of left chest tube and mediastinal drainage catheter. Prior CABG. Stable cardiomegaly. No pulmonary venous congestion. Low lung volumes with bibasilar atelectasis and/or infiltrates again noted. No interim change. No pleural effusion or pneumothorax. IMPRESSION: 1. Interim removal of left chest tube and mediastinal drainage catheter. Right IJ sheath in stable position. No pneumothorax. 2.  Prior CABG.  Heart size stable. 3.  Low lung volumes with stable bibasilar subsegmental atelectasis. Electronically Signed   By: Maisie Fushomas  Register   On: 05/13/2015 07:42      Discharge Instructions    Amb Referral to Cardiac Rehabilitation    Complete by:  As directed    Diagnosis:  CABG            Discharge Medications:   Medication List    STOP taking these medications        metoprolol succinate 50 MG 24 hr tablet  Commonly known as:  TOPROL-XL     nitroGLYCERIN 0.4 MG SL tablet  Commonly known as:  NITROSTAT      TAKE these medications        amiodarone 200 MG tablet  Commonly known as:  PACERONE  Take 2 tablets (400 mg total) by mouth 2 (two) times daily. X 7 days, then decrease to 200 mg BID x 7 days, then decrease to 200 mg QD     aspirin 81 MG tablet  Take 81 mg by mouth daily.     CENTRUM PO  Take by mouth daily.     clopidogrel 75 MG tablet  Commonly known as:  PLAVIX  Take 1 tablet (75 mg total) by mouth daily.     diazepam 5 MG tablet  Commonly known as:  VALIUM  Take 1 tablet (5 mg total) by mouth every 6 (six) hours as needed for anxiety.     ferrous fumarate-b12-vitamic C-folic acid capsule  Commonly known as:  TRINSICON / FOLTRIN  Take 1 capsule by mouth 3 (three) times daily after meals.     fexofenadine 180 MG tablet  Commonly known as:  ALLEGRA  Take 180 mg by mouth 2 (two) times a week.     Fish Oil 1000 MG Caps  Take 1,000 mg by mouth 2 (two) times daily.     furosemide 40 MG tablet  Commonly known as:  LASIX  Take 1 tablet (40 mg total) by mouth daily. X 7 days     lisinopril 2.5 MG tablet  Commonly known as:  PRINIVIL,ZESTRIL  Take 1 tablet (2.5 mg total) by mouth daily.     metoprolol tartrate 25 MG tablet  Commonly known as:  LOPRESSOR  Take 0.5 tablets (12.5 mg total) by mouth 2 (two) times daily.     OVER THE COUNTER MEDICATION  Take 1 tablet by mouth daily as needed (leg cramps). Takes Legatrin for leg cramps     potassium chloride SA 20 MEQ tablet  Commonly known as:  K-DUR,KLOR-CON  Take 1 tablet (20 mEq total) by mouth daily. X 7 days     simvastatin 40 MG tablet  Commonly known as:  ZOCOR  Take 1 tablet (40 mg total) by mouth at bedtime.     traMADol 50 MG tablet  Commonly  known as:  ULTRAM  Take 1-2 tablets (50-100 mg total) by mouth every 4 (four) hours as needed for moderate pain.       The patient has been discharged on:   1.Beta Blocker:  Yes [  x ]                              No   [   ]                              If No, reason:  2.Ace Inhibitor/ARB: Yes [ x ]                                     No  [    ]                                     If No, reason:  3.Statin:   Yes [  x ]                  No  [   ]                  If No, reason:  4.Ecasa:  Yes  [ x  ]                  No   [   ]                  If No, reason:   Follow Up Appointments: Follow-up Information    Follow up with Kerin Perna III, MD On 05/13/2015.   Specialty:  Cardiothoracic Surgery   Why:  PA/LAT CXR to be taken (at [facility] which is in the same building as Dr. Zenaida Niece Trigt's office) on 06/16/2015 at 9:45 am;Appointment time is at 10:30 am   Contact information:   [address] [place] [address] [phone]       Follow up with Nicolasa Ducking, NP On 05/28/2015.   Specialties:  Nurse Practitioner, Cardiology, Radiology   Why:  Appointment is at 2:00   Contact information:   [address] [place] [address] [phone]        Signed: Marrion Coy 05/15/2015, 8:46 AM

## 2015-05-14 MED ORDER — LISINOPRIL 2.5 MG PO TABS
2.5000 mg | ORAL_TABLET | Freq: Every day | ORAL | Status: DC
  Administered 2015-05-14 – 2015-05-15 (×2): 2.5 mg via ORAL
  Filled 2015-05-14 (×2): qty 1

## 2015-05-14 NOTE — Progress Notes (Signed)
Pt ambulated 150 ft using a rolling walker with standby assist. Pt tolerated well. No new complaints. Pt back in bed with call light within reach. Family member at bedside. Will continue to monitor.   Tyree Fluharty, RN

## 2015-05-14 NOTE — Progress Notes (Signed)
After sitting up recliner for 2.5 hrs, Pt ambulated 300 ft using RW.  To edge of bed for dinner with family in room after walk.  Will con't plan of care.

## 2015-05-14 NOTE — Progress Notes (Addendum)
       [address]       [place] [id number]             [phone]          4 Days Post-Op Procedure(s) (LRB): CORONARY ARTERY BYPASS GRAFTING (CABG), ON PUMP, TIMES FOUR, USING LEFT INTERNAL MAMMARY ARTERY, BILATERAL GREATER SAPHENOUS VEINS HARVESTED ENDOSCOPICALLY, WITH CORONARY ENDARTERECTOMY (N/A) TRANSESOPHAGEAL ECHOCARDIOGRAM (TEE) (N/A)  Subjective: Feels well. Less cough today. Main complaint is "anxiety attacks".  Has had problems with this in the past and takes diazepam prn.   Objective: Vital signs in last 24 hours: Patient Vitals for the past 24 hrs:  BP Temp Temp src Pulse Resp SpO2 Weight  05/14/15 0516 139/75 mmHg 98.2 F (36.8 C) Oral 75 18 95 % 229 lb 3.2 oz (103.964 kg)  05/13/15 2104 134/73 mmHg 98.1 F (36.7 C) Oral 79 18 92 % -  05/13/15 1356 117/64 mmHg 97.9 F (36.6 C) Oral 69 18 96 % -   Current Weight  05/14/15 229 lb 3.2 oz (103.964 kg)  BASELINE WEIGHT: 99 kg   Intake/Output from previous day: 11/03 0701 - 11/04 0700 In: 720 [P.O.:720] Out: 1150 [Urine:1150]    PHYSICAL EXAM:  Heart: RRR Lungs: Clear Wound: Clean and dry Extremities: +LE edema, ecchymosis R thigh    Lab Results: CBC: Recent Labs  05/12/15 0544 05/13/15 0324  WBC 8.3 7.0  HGB 8.8* 8.6*  HCT 25.4* 25.6*  PLT PLATELET CLUMPS NOTED ON SMEAR, COUNT APPEARS ADEQUATE 131*   BMET:  Recent Labs  05/12/15 0544 05/13/15 0324  NA 134* 134*  K 4.1 4.4  CL 101 102  CO2 23 25  GLUCOSE 144* 141*  BUN 23* 23*  CREATININE 1.20 1.09  CALCIUM 8.5* 8.2*    PT/INR: No results for input(s): LABPROT, INR in the last 72 hours.    Assessment/Plan: S/P Procedure(s) (LRB): CORONARY ARTERY BYPASS GRAFTING (CABG), ON PUMP, TIMES FOUR, USING LEFT INTERNAL MAMMARY ARTERY, BILATERAL GREATER SAPHENOUS VEINS HARVESTED ENDOSCOPICALLY, WITH CORONARY ENDARTERECTOMY (N/A) TRANSESOPHAGEAL ECHOCARDIOGRAM (TEE) (N/A)  CV- AF, now maintaining SR. Continue Amio,  Lopressor. BPs trending up, may consider starting low dose ACE-I now that renal function is back to baseline.  Vol overload- continue diuresis.  Pulm- continue IS/pulm toilet.  Anxiety- Continue prn Xanax.  Expected postop blood loss anemia- H/H stable. Continue Fe.  D/c EPWs, Ct sutures. Home in the next 1-2 days if he remains stable.   LOS: 4 days    COLLINS,GINA H 05/14/2015  patient examined and medical record reviewed,agree with above note. Kathlee Nationseter Van Tr[place] III 05/14/2015

## 2015-05-14 NOTE — Progress Notes (Signed)
CARDIAC REHAB PHASE I   PRE:  Rate/Rhythm: 77 SR  BP:  Supine:   Sitting: 150/76  Standing:    SaO2: 95%RA  MODE:  Ambulation: 300 ft   POST:  Rate/Rhythm: 90 SR  BP:  Supine:   Sitting: 159/80  Standing:    SaO2: 95%RA 0926-1020 Pt walked 300 ft on RA with rolling walker and asst x1 with steady gait. Tolerated well. Education completed with pt and family who voiced understanding. Encouraged IS. Discussed CRP 2 and will refer to Fernando Salinas. Wrote down how to view post op video for them to watch later. Pt has rolling walker at home to use. To bed after walk.   Luetta Nuttingharlene Jjesus Dingley, RN BSN  05/14/2015 10:15 AM

## 2015-05-14 NOTE — Progress Notes (Signed)
EPWs and CT sutures removed per order and unit protocol.  Pt tolerated well, VSS stable.  Sites painted, steri's applied.  Pt and family understand bedrest for one hour.  CCMD notified, will monitor closely.

## 2015-05-15 DIAGNOSIS — I25119 Atherosclerotic heart disease of native coronary artery with unspecified angina pectoris: Secondary | ICD-10-CM | POA: Diagnosis not present

## 2015-05-15 LAB — BASIC METABOLIC PANEL
ANION GAP: 9 (ref 5–15)
BUN: 15 mg/dL (ref 6–20)
CO2: 26 mmol/L (ref 22–32)
Calcium: 8.5 mg/dL — ABNORMAL LOW (ref 8.9–10.3)
Chloride: 95 mmol/L — ABNORMAL LOW (ref 101–111)
Creatinine, Ser: 0.97 mg/dL (ref 0.61–1.24)
GFR calc Af Amer: >60 mL/min (ref >60)
Glucose, Bld: 114 mg/dL — ABNORMAL HIGH (ref 65–99)
POTASSIUM: 4.3 mmol/L (ref 3.5–5.1)
Sodium: 130 mmol/L — ABNORMAL LOW (ref 135–145)

## 2015-05-15 MED ORDER — POTASSIUM CHLORIDE CRYS ER 20 MEQ PO TBCR: 20.0000 meq | EXTENDED_RELEASE_TABLET | Freq: Every day | ORAL | Status: DC

## 2015-05-15 MED ORDER — LISINOPRIL 2.5 MG PO TABS: 2.5000 mg | ORAL_TABLET | Freq: Every day | ORAL | Status: DC

## 2015-05-15 MED ORDER — FUROSEMIDE 40 MG PO TABS: 40.0000 mg | ORAL_TABLET | Freq: Every day | ORAL | Status: DC

## 2015-05-15 MED ORDER — AMIODARONE HCL 200 MG PO TABS: 400.0000 mg | ORAL_TABLET | Freq: Two times a day (BID) | ORAL | Status: DC

## 2015-05-15 MED ORDER — DIAZEPAM 5 MG PO TABS: 5.0000 mg | ORAL_TABLET | Freq: Four times a day (QID) | ORAL | Status: DC | PRN

## 2015-05-15 MED ORDER — METOPROLOL TARTRATE 25 MG PO TABS: 12.5000 mg | ORAL_TABLET | Freq: Two times a day (BID) | ORAL | Status: DC

## 2015-05-15 MED ORDER — TRAMADOL HCL 50 MG PO TABS: 50.0000 mg | ORAL_TABLET | ORAL | Status: DC | PRN

## 2015-05-15 MED ORDER — FE FUMARATE-B12-VIT C-FA-IFC PO CAPS: 1.0000 | ORAL_CAPSULE | Freq: Three times a day (TID) | ORAL | Status: DC

## 2015-05-15 NOTE — Progress Notes (Signed)
      [address]       [place] [id number]             [phone]      5 Days Post-Op Procedure(s) (LRB): CORONARY ARTERY BYPASS GRAFTING (CABG), ON PUMP, TIMES FOUR, USING LEFT INTERNAL MAMMARY ARTERY, BILATERAL GREATER SAPHENOUS VEINS HARVESTED ENDOSCOPICALLY, WITH CORONARY ENDARTERECTOMY (N/A) TRANSESOPHAGEAL ECHOCARDIOGRAM (TEE) (N/A)   Subjective:  [name] has no complaints this morning.  He states he would like to go home.  + ambulation + BM  Objective: Vital signs in last 24 hours: Temp:  [98.2 F (36.8 C)-98.4 F (36.9 C)] 98.4 F (36.9 C) (11/05 0432) Pulse Rate:  [71-77] 77 (11/05 0432) Cardiac Rhythm:  [-] Normal sinus rhythm (11/04 1900) Resp:  [18] 18 (11/05 0432) BP: (127-139)/(59-77) 130/59 mmHg (11/05 0432) SpO2:  [94 %-96 %] 96 % (11/05 0432) Weight:  [228 lb 2.8 oz (103.5 kg)] 228 lb 2.8 oz (103.5 kg) (11/05 0432)  Intake/Output from previous day: 11/04 0701 - 11/05 0700 In: 720 [P.O.:720] Out: 3050 [Urine:3050]  General appearance: alert, cooperative and no distress Heart: regular rate and rhythm Lungs: clear to auscultation bilaterally Abdomen: soft, non-tender; bowel sounds normal; no masses,  no organomegaly Extremities: edema trace, RLE ecchymosis Wound: clean and dry  Lab Results:  Recent Labs  05/13/15 0324  WBC 7.0  HGB 8.6*  HCT 25.6*  PLT 131*   BMET:  Recent Labs  05/13/15 0324 05/15/15 0332  NA 134* 130*  K 4.4 4.3  CL 102 95*  CO2 25 26  GLUCOSE 141* 114*  BUN 23* 15  CREATININE 1.09 0.97  CALCIUM 8.2* 8.5*    PT/INR: No results for input(s): LABPROT, INR in the last 72 hours. ABG    Component Value Date/Time   PHART 7.388 05/11/2015 0615   HCO3 23.6 05/11/2015 0615   TCO2 19 05/11/2015 1748   ACIDBASEDEF 1.0 05/11/2015 0615   O2SAT 91.0 05/11/2015 0615   CBG (last 3)   Recent Labs  05/12/15 1705 05/12/15 2137 05/13/15 0638  GLUCAP 115* 80 120*    Assessment/Plan: S/P  Procedure(s) (LRB): CORONARY ARTERY BYPASS GRAFTING (CABG), ON PUMP, TIMES FOUR, USING LEFT INTERNAL MAMMARY ARTERY, BILATERAL GREATER SAPHENOUS VEINS HARVESTED ENDOSCOPICALLY, WITH CORONARY ENDARTERECTOMY (N/A) TRANSESOPHAGEAL ECHOCARDIOGRAM (TEE) (N/A)  1. Cv- remains hemodynamically stable- continue Lopressor, hold off on ACE for now, BP is well controlled 2. Pulm- no acute issues, continue IS 3. Renal- creatinine WNL, remains about 9lbs above baseline weight, continue lasix 4. Anxiety- continue Xanax, explained to family will need to follow up with PCP for management of this 5. DIspo- patient stable, will plan to d/c home today   LOS: 5 days    Raford PitcherBARRETT, [facility] - [facility] WayneERIN 05/15/2015

## 2015-05-19 NOTE — Progress Notes (Signed)
This encounter was created in error - please disregard.

## 2015-05-28 ENCOUNTER — Encounter: Payer: Self-pay | Admitting: Nurse Practitioner

## 2015-05-28 ENCOUNTER — Ambulatory Visit (INDEPENDENT_AMBULATORY_CARE_PROVIDER_SITE_OTHER): Payer: Medicare Other | Admitting: Nurse Practitioner

## 2015-05-28 VITALS — BP 108/62 | HR 75 | Ht 72.0 in | Wt 220.2 lb

## 2015-05-28 DIAGNOSIS — I10 Essential (primary) hypertension: Secondary | ICD-10-CM | POA: Diagnosis not present

## 2015-05-28 DIAGNOSIS — I25810 Atherosclerosis of coronary artery bypass graft(s) without angina pectoris: Secondary | ICD-10-CM

## 2015-05-28 DIAGNOSIS — E782 Mixed hyperlipidemia: Secondary | ICD-10-CM | POA: Diagnosis not present

## 2015-05-28 DIAGNOSIS — I480 Paroxysmal atrial fibrillation: Secondary | ICD-10-CM

## 2015-05-28 NOTE — Patient Instructions (Signed)
Medication Instructions:  Please continue your current medications   Labwork: None  Testing/Procedures: None  Follow-Up: 4 months w/ Dr. Mariah MillingGollan  If you need a refill on your cardiac medications before your next appointment, please call your pharmacy.

## 2015-05-28 NOTE — Progress Notes (Signed)
Patient Name: [name] Date of Encounter: 05/28/2015  Primary Care Provider:  Marguarite Arbour, MD Primary Cardiologist:  Concha Se, MD   Chief Complaint  74 year old male status post recent coronary artery bypass grafting who presents for follow-up.  Past Medical History   Past Medical History  Diagnosis Date  . CAD (coronary artery disease)     a. s/p DES to LAD and RCA (2001/2005);  b. 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  Marland Kitchen Hyperlipidemia, mixed   . Essential hypertension   . ED (erectile dysfunction) of non-organic origin   . History of pneumonia   . Kidney stones   . Arthritis   . Leg cramps   . Post-operative Atrial Fibrillation    Past Surgical History  Procedure Laterality Date  . Coronary angioplasty      3 stents  . Back surgery      2 lunbar surgeries  . Cardiac catheterization N/A 04/30/2015    Procedure: Left Heart Cath;  Surgeon: Antonieta Iba, MD;  Location: [facility] INVASIVE CV LAB;  Service: Cardiovascular;  Laterality: N/A;  . Retinal detachment surgery Left   . Tee without cardioversion N/A 05/10/2015    Procedure: TRANSESOPHAGEAL ECHOCARDIOGRAM (TEE);  Surgeon: Kerin Perna, MD;  Location: [facility] OR;  Service: Open Heart Surgery;  Laterality: N/A;  . Coronary artery bypass graft N/A 05/10/2015    Procedure: CORONARY ARTERY BYPASS GRAFTING (CABG), ON PUMP, TIMES FOUR, USING LEFT INTERNAL MAMMARY ARTERY, BILATERAL GREATER SAPHENOUS VEINS HARVESTED ENDOSCOPICALLY, WITH CORONARY ENDARTERECTOMY;  Surgeon: Kerin Perna, MD;  Location: [facility] OR;  Service: Open Heart Surgery;  Laterality: N/A;    Allergies  Allergies  Allergen Reactions  . Codeine     REACTION: Vomiting    HPI  74 year old male with the above complex problem list. He has a prior history of CAD  (74 year old male) status post stenting of LAD and RCA in the early 2000. More recently, he had been experiencing exertional dyspnea and was subsequently evaluated by pulmonology and  then cardiology. He had an abnormal stress test and then catheterization revealed severe multivessel coronary artery disease. He was referred to thoracic surgery and subsequently underwent coronary artery bypass grafting 4 on 05/11/2015. Postoperative course was complicated by mild anemia, mild thrombocytopenia, and atrial fibrillation requiring initiation of amiodarone therapy with subsequent conversion to sinus rhythm. Following discharge he has done well. He currently denies any chest wall pain and reports that his wounds have been healing well. He has not had any angina or significant dyspnea on exertion. He previously was very active, walking 5 miles several days a week and is looking for to getting back to that level of activity though currently fatigues fairly easily-noting that he hasn't been doing very much since discharge. He denies PND, orthopnea, dizziness, syncope, edema, or early satiety.  Home Medications  Prior to Admission medications   Medication Sig Start Date End Date Taking? Authorizing Provider  amiodarone (PACERONE) 200 MG tablet Take 2 tablets (400 mg total) by mouth 2 (two) times daily. X 7 days, then decrease to 200 mg BID x 7 days, then decrease to 200 mg QD 05/15/15  Yes Erin R Barrett, PA-C  aspirin 81 MG tablet Take 81 mg by mouth daily.     Yes Historical Provider, MD  clopidogrel (PLAVIX) 75 MG tablet Take 1 tablet (75 mg total) by mouth daily. 04/08/14  Yes Antonieta Iba, MD  diazepam (VALIUM) 5 MG tablet Take 1 tablet (5 mg total) by  mouth every 6 (six) hours as needed for anxiety. 05/15/15  Yes Erin R Barrett, PA-C  ferrous fumarate-b12-vitamic C-folic acid (TRINSICON / FOLTRIN) capsule Take 1 capsule by mouth 3 (three) times daily after meals. 05/15/15  Yes Erin R Barrett, PA-C  fexofenadine (ALLEGRA) 180 MG tablet Take 180 mg by mouth 2 (two) times a week.   Yes Historical Provider, MD  lisinopril (PRINIVIL,ZESTRIL) 2.5 MG tablet Take 1 tablet (2.5 mg total) by mouth  daily. 05/15/15  Yes Erin R Barrett, PA-C  metoprolol tartrate (LOPRESSOR) 25 MG tablet Take 0.5 tablets (12.5 mg total) by mouth 2 (two) times daily. 05/15/15  Yes Erin R Barrett, PA-C  Multiple Vitamins-Minerals (CENTRUM PO) Take by mouth daily.     Yes Historical Provider, MD  Omega-3 Fatty Acids (FISH OIL) 1000 MG CAPS Take 1,000 mg by mouth 2 (two) times daily.   Yes Historical Provider, MD  simvastatin (ZOCOR) 40 MG tablet Take 1 tablet (40 mg total) by mouth at bedtime. 04/08/14  Yes Antonieta Ibaimothy J Gollan, MD    Review of Systems  As above, he is somewhat fatigued with activity but has been walking some. He denies PND, orthopnea, dizziness, syncope, edema, early satiety, chest pain, or palpitations. All other systems reviewed and are otherwise negative except as noted above.  Physical Exam  VS:  BP 108/62 mmHg  Pulse 75  Ht 6' (1.829 m)  Wt 220 lb 4 oz (99.905 kg)  BMI 29.86 kg/m2 , BMI Body mass index is 29.86 kg/(m^2). GEN: Well nourished, well developed, in no acute distress. HEENT: normal. Neck: Supple, no JVD, carotid bruits, or masses. Cardiac: RRR, no murmurs, rubs, or gallops. No clubbing, cyanosis, edema. Midsternal surgical incision is healing well without drainage or erythema. Bilateral leg incisions are healing well without erythema or drainage. Does have bruising to the right lower extremity/calf. Radials/DP/PT 2+ and equal bilaterally.  Respiratory:  Respirations regular and unlabored, clear to auscultation bilaterally. GI: Soft, nontender, nondistended, BS + x 4. Upper abdominal incisions healing well without erythema or drainage. MS: no deformity or atrophy. Skin: warm and dry, no rash. Neuro:  Strength and sensation are intact. Psych: Normal affect.  Accessory Clinical Findings  ECG - regular sinus rhythm, 75, first-degree AV block, left axis, left anterior fascicular block, intraventricular conduction delay, no acute ST or T changes.  Assessment & Plan  1.   Coronary artery disease status post coronary artery bypass grafting 4: Patient is doing well following recent bypass grafting. His surgical sites are healing well. He has not been having any angina and no longer has any musculoskeletal chest wall pain. He does report mild fatigue but believes that that is secondary to deconditioning and relative inactivity. He is looking forward to being more active. He is interested in cardiac rehabilitation and will partake in this at [facility] once cleared by surgery. He remains on aspirin, Plavix, beta blocker, ACE inhibitor, and statin therapy.  2. Postoperative atrial fibrillation: He is in sinus rhythm today and remains on amiodarone which he is tolerating. His QT is normal. He is not on oral anticoagulation.  3. Essential hypertension: Stable.  4. Hyperlipidemia: Continue simvastatin therapy.  5. Disposition: Follow-up with Dr. Mariah MillingGollan in 3 months or sooner if necessary. Nicolasa Duckinghristopher Berge, NP 05/28/2015, 5:08 PM

## 2015-05-31 ENCOUNTER — Telehealth: Payer: Self-pay

## 2015-05-31 NOTE — Telephone Encounter (Signed)
Spoke w/ Patty.  She reports that pt has allergies, she thought he was coughing due to this. She reports that pt went to a family birthday party on Saturday, woke up on Sunday w/ cough and congestion. She is requesting rx for abx and tessalon perls.  Advised her to contact Dr. Judithann SheenSparks office, as we are cardiology and cannot send this in. She is appreciative of the call and will let us know if we can be of further assistance.

## 2015-05-31 NOTE — Telephone Encounter (Signed)
Pt daughter called, states pt has developed a horrible chest cold over the weekend. Coughing, and hurting.  States she is fearful of him getting pneumonia due to his bypass surgery. States he also is not using the bathroom. STates she has given him prune juice, and prunes, and nothing has helped. Please call. She is not sure if she should call his PCP or not.

## 2015-06-01 ENCOUNTER — Ambulatory Visit: Admission: RE | Admit: 2015-06-01 | Payer: Medicare Other | Source: Ambulatory Visit

## 2015-06-15 ENCOUNTER — Other Ambulatory Visit: Payer: Self-pay | Admitting: *Deleted

## 2015-06-15 DIAGNOSIS — Z951 Presence of aortocoronary bypass graft: Secondary | ICD-10-CM

## 2015-06-16 ENCOUNTER — Encounter: Payer: Self-pay | Admitting: Cardiothoracic Surgery

## 2015-06-16 ENCOUNTER — Ambulatory Visit (INDEPENDENT_AMBULATORY_CARE_PROVIDER_SITE_OTHER): Payer: Self-pay | Admitting: Cardiothoracic Surgery

## 2015-06-16 ENCOUNTER — Ambulatory Visit
Admission: RE | Admit: 2015-06-16 | Discharge: 2015-06-16 | Disposition: A | Discharge status: Home / Self Care | Payer: Medicare Other | Source: Ambulatory Visit | Attending: Cardiothoracic Surgery | Admitting: Cardiothoracic Surgery

## 2015-06-16 VITALS — BP 160/90 | HR 66 | Resp 16 | Ht 72.0 in | Wt 212.4 lb

## 2015-06-16 DIAGNOSIS — I2510 Atherosclerotic heart disease of native coronary artery without angina pectoris: Secondary | ICD-10-CM

## 2015-06-16 DIAGNOSIS — Z951 Presence of aortocoronary bypass graft: Secondary | ICD-10-CM

## 2015-06-16 NOTE — Progress Notes (Signed)
PCP is SPARKS,JEFFREY D, MD Referring Provider is Sparks, Duane Lope, MD  Chief Complaint  Patient presents with  . Routine Post Op    s/p CABG X 4 05/11/15 with a cxr    HPI: One month followup after urgent CABG x4 with preoperative unstable angina. Previous PCI of RCA on chronic Plavix.  Patient is doing well without recurrent angina. He is maintained sinus rhythm on amiodarone after having transient atrial fibrillation while hospitalized after CABG. He complains of a wet cough which has lingered for 2 weeks and has caused chest wall soreness and some difficulty with sleeping. His walking at least half mile at a time.  The surgical incisions are healing well Chest x-ray today shows no active infiltrate or effusion or edema  Past Medical History  Diagnosis Date  . CAD (coronary artery disease)     a. s/p DES to LAD and RCA (2001/2005);  b. 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  Marland Kitchen Hyperlipidemia, mixed   . Essential hypertension   . ED (erectile dysfunction) of non-organic origin   . History of pneumonia   . Kidney stones   . Arthritis   . Leg cramps   . Post-operative Atrial Fibrillation     Past Surgical History  Procedure Laterality Date  . Coronary angioplasty      3 stents  . Back surgery      2 lunbar surgeries  . Cardiac catheterization N/A 04/30/2015    Procedure: Left Heart Cath;  Surgeon: Antonieta Iba, MD;  Location: [facility] INVASIVE CV LAB;  Service: Cardiovascular;  Laterality: N/A;  . Retinal detachment surgery Left   . Tee without cardioversion N/A 05/10/2015    Procedure: TRANSESOPHAGEAL ECHOCARDIOGRAM (TEE);  Surgeon: Kerin Perna, MD;  Location: [facility] OR;  Service: Open Heart Surgery;  Laterality: N/A;  . Coronary artery bypass graft N/A 05/10/2015    Procedure: CORONARY ARTERY BYPASS GRAFTING (CABG), ON PUMP, TIMES FOUR, USING LEFT INTERNAL MAMMARY ARTERY, BILATERAL GREATER SAPHENOUS VEINS HARVESTED ENDOSCOPICALLY, WITH CORONARY  ENDARTERECTOMY;  Surgeon: Kerin Perna, MD;  Location: [facility] OR;  Service: Open Heart Surgery;  Laterality: N/A;    Family History  Problem Relation Age of Onset  . Family history unknown: Yes    Social History Social History  Substance Use Topics  . Smoking status: Never Smoker   . Smokeless tobacco: None     Comment: tobacco use- no  . Alcohol Use: No    Current Outpatient Prescriptions  Medication Sig Dispense Refill  . amiodarone (PACERONE) 200 MG tablet Take 2 tablets (400 mg total) by mouth 2 (two) times daily. X 7 days, then decrease to 200 mg BID x 7 days, then decrease to 200 mg QD 90 tablet 1  . aspirin 81 MG tablet Take 81 mg by mouth daily.      . clopidogrel (PLAVIX) 75 MG tablet Take 1 tablet (75 mg total) by mouth daily. 90 tablet 3  . diazepam (VALIUM) 5 MG tablet Take 1 tablet (5 mg total) by mouth every 6 (six) hours as needed for anxiety. 30 tablet 0  . ferrous fumarate-b12-vitamic C-folic acid (TRINSICON / FOLTRIN) capsule Take 1 capsule by mouth 3 (three) times daily after meals. 90 capsule 0  . fexofenadine (ALLEGRA) 180 MG tablet Take 180 mg by mouth 2 (two) times a week.    Marland Kitchen lisinopril (PRINIVIL,ZESTRIL) 2.5 MG tablet Take 1 tablet (2.5 mg total) by mouth daily. 30 tablet 3  . metoprolol tartrate (LOPRESSOR)  25 MG tablet Take 0.5 tablets (12.5 mg total) by mouth 2 (two) times daily. 30 tablet 3  . Multiple Vitamins-Minerals (CENTRUM PO) Take by mouth daily.      . Omega-3 Fatty Acids (FISH OIL) 1000 MG CAPS Take 1,000 mg by mouth 2 (two) times daily.    . simvastatin (ZOCOR) 40 MG tablet Take 1 tablet (40 mg total) by mouth at bedtime. 90 tablet 3   No current facility-administered medications for this visit.    Allergies  Allergen Reactions  . Codeine     REACTION: Vomiting    Review of Systems   Poor appetite probably related to meds No angina Chest and leg incisions healing well  BP 160/90 mmHg  Pulse 66  Resp 16  Ht 6' (1.829 m)  Wt 212  lb 6.4 oz (96.344 kg)  BMI 28.80 kg/m2  SpO2 66% O2 sat 99% room air recheck Physical Exam Alert and comfortable Lungs clear Heart rate regular without murmur Abdomen soft Leg incision is well-healed, no pedal edema  Diagnostic Tests: Chest x-ray clear, sternal wires intact  Impression: Doing well one month after CABG. He can now lift up to 20 pounds maximum and can resume driving We will discontinue the amiodarone and iron which should help his appetite He'll be given a course of prednisone taper with Zithromax for his wet cough-bronchitis We will refer him to phase II outpatient cardiac rehabilitation at [facility] The patient return for followup visit in 2 months to monitor progress  Plan:return in 2 months   Mikey BussingPeter Van Trigt III, MD [facility] [phone]

## 2015-06-17 ENCOUNTER — Encounter: Payer: Self-pay | Admitting: *Deleted

## 2015-06-17 ENCOUNTER — Ambulatory Visit: Payer: Medicare Other

## 2015-06-17 NOTE — Progress Notes (Signed)
Patient ID: [name], male   DOB: 08/26/1940, 74 y.o.   MRN: [name] Mr. Frederica KusterHerman Conely 08/05/1940 has been [facility] to [facility] PHASE II per the request of Dr. Donata ClayVan Trigt. A referral was faxed.

## 2015-07-06 ENCOUNTER — Other Ambulatory Visit: Payer: Self-pay | Admitting: Cardiovascular Disease

## 2015-07-08 ENCOUNTER — Encounter: Payer: Medicare Other | Attending: Cardiovascular Disease | Admitting: *Deleted

## 2015-07-08 VITALS — BP 120/70 | Ht 70.4 in | Wt 220.5 lb

## 2015-07-08 DIAGNOSIS — Z951 Presence of aortocoronary bypass graft: Secondary | ICD-10-CM | POA: Diagnosis not present

## 2015-07-08 NOTE — Progress Notes (Signed)
Pulmonary Individual Treatment Plan  Patient Details  Name: [name] MRN: [id number] Date of Birth: 27-Mar-1941 Referring Provider:  Antonieta Iba, MD  Initial Encounter Date: Date: 07/08/15  Visit Diagnosis: S/P CABG x 4  Patient's Home Medications on Admission:  Current outpatient prescriptions:  .  aspirin 81 MG tablet, Take 81 mg by mouth daily.  , Disp: , Rfl:  .  clopidogrel (PLAVIX) 75 MG tablet, TAKE 1 TABLET EVERY DAY, Disp: 90 tablet, Rfl: 3 .  ferrous fumarate-b12-vitamic C-folic acid (TRINSICON / FOLTRIN) capsule, Take 1 capsule by mouth 3 (three) times daily after meals., Disp: 90 capsule, Rfl: 0 .  lisinopril (PRINIVIL,ZESTRIL) 2.5 MG tablet, Take 1 tablet (2.5 mg total) by mouth daily., Disp: 30 tablet, Rfl: 3 .  metoprolol tartrate (LOPRESSOR) 25 MG tablet, Take 0.5 tablets (12.5 mg total) by mouth 2 (two) times daily., Disp: 30 tablet, Rfl: 3 .  Multiple Vitamins-Minerals (CENTRUM PO), Take by mouth daily.  , Disp: , Rfl:  .  Omega-3 Fatty Acids (FISH OIL) 1000 MG CAPS, Take 1,000 mg by mouth 2 (two) times daily., Disp: , Rfl:  .  simvastatin (ZOCOR) 40 MG tablet, TAKE 1 TABLET EVERY DAY, Disp: 90 tablet, Rfl: 3 .  amiodarone (PACERONE) 200 MG tablet, Take 2 tablets (400 mg total) by mouth 2 (two) times daily. X 7 days, then decrease to 200 mg BID x 7 days, then decrease to 200 mg QD (Patient not taking: Reported on 07/08/2015), Disp: 90 tablet, Rfl: 1 .  diazepam (VALIUM) 5 MG tablet, Take 1 tablet (5 mg total) by mouth every 6 (six) hours as needed for anxiety., Disp: 30 tablet, Rfl: 0 .  fexofenadine (ALLEGRA) 180 MG tablet, Take 180 mg by mouth 2 (two) times a week., Disp: , Rfl:   Past Medical History: Past Medical History  Diagnosis Date  . CAD (coronary artery disease)     a. s/p DES to LAD and RCA (2001/2005);  b. 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  Marland Kitchen Hyperlipidemia, mixed   . Essential hypertension   . ED (erectile dysfunction)  of non-organic origin   . History of pneumonia   . Kidney stones   . Arthritis   . Leg cramps   . Post-operative Atrial Fibrillation     Tobacco Use: History  Smoking status  . Never Smoker   Smokeless tobacco  . Not on file    Comment: tobacco use- no    Labs: Recent Review Flowsheet Data    Labs for ITP Cardiac and Pulmonary Rehab Latest Ref Rng 05/10/2015 05/10/2015 05/10/2015 05/11/2015 05/11/2015   PHART 7.350 - 7.450 7.380 7.340(L) - 7.388 -   PCO2ART 35.0 - 45.0 mmHg 42.0 44.9 - 39.4 -   HCO3 20.0 - 24.0 mEq/L 24.7(H) 24.0 - 23.6 -   TCO2 0 - 100 mmol/L ACIDBASEDEF 0.0 - 2.0 mmol/L - 2.0 - 1.0 -   O2SAT - 97.0 95.0 - 91.0 -       ADL UCSD:    Pulmonary Function Assessment:   Exercise Target Goals: Date: 07/08/15  Exercise Program Goal: Individual exercise prescription set with THRR, safety & activity barriers. Participant demonstrates ability to understand and report RPE using BORG scale, to self-measure pulse accurately, and to acknowledge the importance of the exercise prescription.  Exercise Prescription Goal: Starting with aerobic activity 30 plus minutes a day, 3 days per week for initial exercise prescription. Provide home exercise prescription  and guidelines that participant acknowledges understanding prior to discharge.  Activity Barriers & Risk Stratification:     Activity Barriers & Risk Stratification - 07/08/15 1453    Activity Barriers & Risk Stratification   Activity Barriers Other (comment)  Back problems      6 Minute Walk:     6 Minute Walk      07/08/15 1416       6 Minute Walk   Phase Initial     Distance 1090 feet     Walk Time 6 minutes     Resting HR 80 bpm     Resting BP 128/80 mmHg     Max Ex. HR 102 bpm     Max Ex. BP 128/78 mmHg     RPE 12     Perceived Dyspnea  2     Symptoms No        Initial Exercise Prescription:     Initial Exercise Prescription - 07/08/15 1400    Date of Initial  Exercise Prescription   Date 07/08/15   Treadmill   MPH 1.8   Grade 0   Minutes 10   Recumbant Bike   Level 2   RPM 40   Watts 20   Minutes 10   NuStep   Level 2   Watts 50   Minutes 10   Arm Ergometer   Level 1   Watts 10   Minutes 10   Recumbant Elliptical   Level 2   RPM 40   Watts 20   Minutes 10   REL-XR   Level 3   Watts 50   Minutes 10   T5 Nustep   Level 1   Watts 20   Minutes 10   Biostep-RELP   Level 2   Watts 45   Minutes 10   Prescription Details   Frequency (times per week) 3   Duration Progress to 30 minutes of continuous aerobic without signs/symptoms of physical distress   Intensity   THRR REST +  30   Ratings of Perceived Exertion 11-15   Perceived Dyspnea 2-4   Progression Continue progressive overload as per policy without signs/symptoms or physical distress.   Resistance Training   Training Prescription Yes   Weight 2   Reps 10-15      Exercise Prescription Changes:   Discharge Exercise Prescription (Final Exercise Prescription Changes):    Nutrition:  Target Goals: Understanding of nutrition guidelines, daily intake of sodium 1500mg , cholesterol 200mg , calories 30% from fat and 7% or less from saturated fats, daily to have 5 or more servings of fruits and vegetables.  Biometrics:     Pre Biometrics - 07/08/15 1419    Pre Biometrics   Height 5' 10.4" (1.788 m)   Weight 220 lb 8 oz (100.018 kg)   Waist Circumference 45 inches   Hip Circumference 44 inches   Waist to Hip Ratio 1.02 %   BMI (Calculated) 31.3       Nutrition Therapy Plan and Nutrition Goals:     Nutrition Therapy & Goals - 07/08/15 1458    Nutrition Therapy   Drug/Food Interactions Statins/Certain Fruits   Personal Nutrition Goals   Personal Goal #1 --  [name] said he would prefer not to meet individually with the Cardiac Rehab Registered dietician. "My wife died 9 year ago and I eat out alot-eat alot of grilled chicken.    Intervention Plan    Intervention Using nutrition plan and personal goals to gain a  healthy nutrition lifestyle. Add exercise as prescribed.      Nutrition Discharge: Rate Your Plate Scores:   Psychosocial: Target Goals: Acknowledge presence or absence of depression, maximize coping skills, provide positive support system. Participant is able to verbalize types and ability to use techniques and skills needed for reducing stress and depression.  Initial Review & Psychosocial Screening:     Initial Psych Review & Screening - 07/08/15 1500    Family Dynamics   Good Support System? Yes  Piers's wife died 9 years ago but his daughter lives nearby.    Barriers   Psychosocial barriers to participate in program There are no identifiable barriers or psychosocial needs.   Screening Interventions   Interventions Encouraged to exercise      Quality of Life Scores:     Quality of Life - 07/08/15 1501    Quality of Life Scores   Health/Function Pre 22.7 %   Socioeconomic Pre 26 %   Psych/Spiritual Pre 24.86 %   Family Pre 24 %   GLOBAL Pre 23.95 %      PHQ-9:     Recent Review Flowsheet Data    Depression screen [facility] 2/9 07/08/2015   Decreased Interest 0   Down, Depressed, Hopeless 0   PHQ - 2 Score 0   Altered sleeping 0   Tired, decreased energy 3   Change in appetite 0   Feeling bad or failure about yourself  0   Trouble concentrating 0   Moving slowly or fidgety/restless 0   Suicidal thoughts 0   PHQ-9 Score 3   Difficult doing work/chores Somewhat difficult      Psychosocial Evaluation and Intervention:   Psychosocial Re-Evaluation:  Education: Education Goals: Education classes will be provided on a weekly basis, covering required topics. Participant will state understanding/return demonstration of topics presented.  Learning Barriers/Preferences:     Learning Barriers/Preferences - 07/08/15 1454    Learning Barriers/Preferences   Learning Barriers None   Learning  Preferences Written Material      Education Topics: Initial Evaluation Education: - Verbal, written and demonstration of respiratory meds, RPE/PD scales, oximetry and breathing techniques. Instruction on use of nebulizers and MDIs: cleaning and proper use, rinsing mouth with steroid doses and importance of monitoring MDI activations.   General Nutrition Guidelines/Fats and Fiber: -Group instruction provided by verbal, written material, models and posters to present the general guidelines for heart healthy nutrition. Gives an explanation and review of dietary fats and fiber.   Controlling Sodium/Reading Food Labels: -Group verbal and written material supporting the discussion of sodium use in heart healthy nutrition. Review and explanation with models, verbal and written materials for utilization of the food label.   Exercise Physiology & Risk Factors: - Group verbal and written instruction with models to review the exercise physiology of the cardiovascular system and associated critical values. Details cardiovascular disease risk factors and the goals associated with each risk factor.   Aerobic Exercise & Resistance Training: - Gives group verbal and written discussion on the health impact of inactivity. On the components of aerobic and resistive training programs and the benefits of this training and how to safely progress through these programs.   Flexibility, Balance, General Exercise Guidelines: - Provides group verbal and written instruction on the benefits of flexibility and balance training programs. Provides general exercise guidelines with specific guidelines to those with heart or lung disease. Demonstration and skill practice provided.   Stress Management: - Provides group verbal and written instruction about  the health risks of elevated stress, cause of high stress, and healthy ways to reduce stress.   Depression: - Provides group verbal and written instruction on the  correlation between heart/lung disease and depressed mood, treatment options, and the stigmas associated with seeking treatment.   Exercise & Equipment Safety: - Individual verbal instruction and demonstration of equipment use and safety with use of the equipment.          Cardiac Rehab from 07/08/2015 in [facility] Cardiac and Pulmonary Rehab   Date  07/08/15   Educator  C. EnterkinRN   Instruction Review Code  1- partially meets, needs review/practice      Infection Prevention: - Provides verbal and written material to individual with discussion of infection control including proper hand washing and proper equipment cleaning during exercise session.      Cardiac Rehab from 07/08/2015 in [facility] Cardiac and Pulmonary Rehab   Date  07/08/15   Educator  C. Eh Sesay,RN   Instruction Review Code  2- meets goals/outcomes      Falls Prevention: - Provides verbal and written material to individual with discussion of falls prevention and safety.      Cardiac Rehab from 07/08/2015 in [facility] Cardiac and Pulmonary Rehab   Date  07/08/15   Educator  C. Milica Gully,RN   Instruction Review Code  2- meets goals/outcomes      Diabetes: - Individual verbal and written instruction to review signs/symptoms of diabetes, desired ranges of glucose level fasting, after meals and with exercise. Advice that pre and post exercise glucose checks will be done for 3 sessions at entry of program.   Chronic Lung Diseases: - Group verbal and written instruction to review new updates, new respiratory medications, new advancements in procedures and treatments. Provide informative websites and "800" numbers of self-education.   Lung Procedures: - Group verbal and written instruction to describe testing methods done to diagnose lung disease. Review the outcome of test results. Describe the treatment choices: Pulmonary Function Tests, ABGs and oximetry.   Energy Conservation: - Provide group verbal and written  instruction for methods to conserve energy, plan and organize activities. Instruct on pacing techniques, use of adaptive equipment and posture/positioning to relieve shortness of breath.   Triggers: - Group verbal and written instruction to review types of environmental controls: home humidity, furnaces, filters, dust mite/pet prevention, HEPA vacuums. To discuss weather changes, air quality and the benefits of nasal washing.   Exacerbations: - Group verbal and written instruction to provide: warning signs, infection symptoms, calling MD promptly, preventive modes, and value of vaccinations. Review: effective airway clearance, coughing and/or vibration techniques. Create an Sport and exercise psychologist.   Oxygen: - Individual and group verbal and written instruction on oxygen therapy. Includes supplement oxygen, available portable oxygen systems, continuous and intermittent flow rates, oxygen safety, concentrators, and Medicare reimbursement for oxygen.   Respiratory Medications: - Group verbal and written instruction to review medications for lung disease. Drug class, frequency, complications, importance of spacers, rinsing mouth after steroid MDI's, and proper cleaning methods for nebulizers.   AED/CPR: - Group verbal and written instruction with the use of models to demonstrate the basic use of the AED with the basic ABC's of resuscitation.   Breathing Retraining: - Provides individuals verbal and written instruction on purpose, frequency, and proper technique of diaphragmatic breathing and pursed-lipped breathing. Applies individual practice skills.   Anatomy and Physiology of the Lungs: - Group verbal and written instruction with the use of models to provide basic lung anatomy and  physiology related to function, structure and complications of lung disease.   Heart Failure: - Group verbal and written instruction on the basics of heart failure: signs/symptoms, treatments, explanation of ejection  fraction, enlarged heart and cardiomyopathy.   Sleep Apnea: - Individual verbal and written instruction to review Obstructive Sleep Apnea. Review of risk factors, methods for diagnosing and types of masks and machines for OSA.   Anxiety: - Provides group, verbal and written instruction on the correlation between heart/lung disease and anxiety, treatment options, and management of anxiety.   Relaxation: - Provides group, verbal and written instruction about the benefits of relaxation for patients with heart/lung disease. Also provides patients with examples of relaxation techniques.   Knowledge Questionnaire Score:     Knowledge Questionnaire Score - 07/08/15 1455    Knowledge Questionnaire Score   Pre Score 24      Personal Goals and Risk Factors at Admission:     Personal Goals and Risk Factors at Admission - 07/08/15 1500    Personal Goals and Risk Factors on Admission   Increase Aerobic Exercise and Physical Activity No;Yes   Intervention While in program, learn and follow the exercise prescription taught. Start at a low level workload and increase workload after able to maintain previous level for 30 minutes. Increase time before increasing intensity.   Diabetes No   Hypertension Yes   Goal Participant will see blood pressure controlled within the values of 140/19mm/Hg or within value directed by their physician.   Intervention Provide nutrition & aerobic exercise along with prescribed medications to achieve BP 140/90 or less.   Lipids Yes   Goal Cholesterol controlled with medications as prescribed, with individualized exercise RX and with personalized nutrition plan. Value goals: LDL < , HDL > . Participant states understanding of desired cholesterol values and following prescriptions.   Intervention Provide nutrition & aerobic exercise along with prescribed medications to achieve LDL 70mg , HDL >40mg .      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 Morton Peters wants him to.

## 2015-07-08 NOTE — Progress Notes (Signed)
Daily Session Note  Patient Details  Name: [name] MRN: [id number] Date of Birth: Jan 25, 1941 Referring Provider:  Minna Merritts, MD  Encounter Date: 07/08/2015  Check In:     Session Check In - 07/08/15 1455    Check-In   Staff Present Gerlene Burdock, RN, BSN;Steven Way, BS, ACSM EP-C, Exercise Physiologist   ER physicians immediately available to respond to emergencies See telemetry face sheet for immediately available ER MD   Medication changes reported     No   Fall or balance concerns reported    No   Warm-up and Cool-down Performed on first and last piece of equipment   VAD Patient? No   Pain Assessment   Currently in Pain? No/denies         Goals Met:  Proper associated with RPD/PD & O2 Sat Exercise tolerated well Personal goals reviewed  Goals Unmet:  Not Applicable  Goals Comments: 6 minute walk done today with no problems. [name] is ready to start Cardiac Rehab.    Dr. Emily Filbert is Medical Director for [place] and [facility].

## 2015-07-08 NOTE — Patient Instructions (Signed)
Patient Instructions  Patient Details  Name: [name] MRN: [id number] Date of Birth: 07/30/1940 Referring Provider:  Antonieta IbaGollan, Timothy J, MD  Below are the personal goals you chose as well as exercise and nutrition goals. Our goal is to help you keep on track towards obtaining and maintaining your goals. We will be discussing your progress on these goals with you throughout the program.  Initial Exercise Prescription:     Initial Exercise Prescription - 07/08/15 1400    Date of Initial Exercise Prescription   Date 07/08/15   Treadmill   MPH 1.8   Grade 0   Minutes 10   Recumbant Bike   Level 2   RPM 40   Watts 20   Minutes 10   NuStep   Level 2   Watts 50   Minutes 10   Arm Ergometer   Level 1   Watts 10   Minutes 10   Recumbant Elliptical   Level 2   RPM 40   Watts 20   Minutes 10   REL-XR   Level 3   Watts 50   Minutes 10   T5 Nustep   Level 1   Watts 20   Minutes 10   Biostep-RELP   Level 2   Watts 45   Minutes 10   Prescription Details   Frequency (times per week) 3   Duration Progress to 30 minutes of continuous aerobic without signs/symptoms of physical distress   Intensity   THRR REST +  30   Ratings of Perceived Exertion 11-15   Perceived Dyspnea 2-4   Progression Continue progressive overload as per policy without signs/symptoms or physical distress.   Resistance Training   Training Prescription Yes   Weight 2   Reps 10-15      Exercise Goals: Frequency: Be able to perform aerobic exercise three times per week working toward 3-5 days per week.  Intensity: Work with a perceived exertion of 11 (fairly light) - 15 (hard) as tolerated. Follow your new exercise prescription and watch for changes in prescription as you progress with the program. Changes will be reviewed with you when they are made.  Duration: You should be able to do 30 minutes of continuous aerobic exercise in addition to a 5 minute warm-up and a 5 minute cool-down  routine.  Nutrition Goals: Your personal nutrition goals will be established when you do your nutrition analysis with the dietician.  The following are nutrition guidelines to follow: Cholesterol < 200mg /day Sodium < 1500mg /day Fiber: Men over 50 yrs - 30 grams per day  Personal Goals:     Personal Goals and Risk Factors at Admission - 07/08/15 1500    Personal Goals and Risk Factors on Admission   Increase Aerobic Exercise and Physical Activity No;Yes   Intervention While in program, learn and follow the exercise prescription taught. Start at a low level workload and increase workload after able to maintain previous level for 30 minutes. Increase time before increasing intensity.   Diabetes No   Hypertension Yes   Goal Participant will see blood pressure controlled within the values of 140/290mm/Hg or within value directed by their physician.   Intervention Provide nutrition & aerobic exercise along with prescribed medications to achieve BP 140/90 or less.   Lipids Yes   Goal Cholesterol controlled with medications as prescribed, with individualized exercise RX and with personalized nutrition plan. Value goals: LDL < 70mg , HDL > 40mg . Participant states understanding of desired cholesterol values and following  prescriptions.   Intervention Provide nutrition & aerobic exercise along with prescribed medications to achieve LDL 70mg , HDL >40mg .      Tobacco Use Initial Evaluation: History  Smoking status  . Never Smoker   Smokeless tobacco  . Not on file    Comment: tobacco use- no    Copy of goals given to participant.

## 2015-07-11 ENCOUNTER — Other Ambulatory Visit: Payer: Self-pay | Admitting: Physician Assistant

## 2015-07-11 NOTE — Progress Notes (Signed)
Cardiac Individual Treatment Plan  Patient Details  Name: [name] MRN: [id number] Date of Birth: 05/26/1941 Referring Provider:  Antonieta IbaGollan, Timothy J, MD  Initial Encounter Date: Date: 07/08/15  Visit Diagnosis: S/P CABG x 4 - Plan: CARDIAC REHAB 30 DAY REVIEW  Patient's Home Medications on Admission:  Current outpatient prescriptions:  .  aspirin 81 MG tablet, Take 81 mg by mouth daily.  , Disp: , Rfl:  .  clopidogrel (PLAVIX) 75 MG tablet, TAKE 1 TABLET EVERY DAY, Disp: 90 tablet, Rfl: 3 .  ferrous fumarate-b12-vitamic C-folic acid (TRINSICON / FOLTRIN) capsule, Take 1 capsule by mouth 3 (three) times daily after meals., Disp: 90 capsule, Rfl: 0 .  lisinopril (PRINIVIL,ZESTRIL) 2.5 MG tablet, Take 1 tablet (2.5 mg total) by mouth daily., Disp: 30 tablet, Rfl: 3 .  metoprolol tartrate (LOPRESSOR) 25 MG tablet, Take 0.5 tablets (12.5 mg total) by mouth 2 (two) times daily., Disp: 30 tablet, Rfl: 3 .  Multiple Vitamins-Minerals (CENTRUM PO), Take by mouth daily.  , Disp: , Rfl:  .  Omega-3 Fatty Acids (FISH OIL) 1000 MG CAPS, Take 1,000 mg by mouth 2 (two) times daily., Disp: , Rfl:  .  simvastatin (ZOCOR) 40 MG tablet, TAKE 1 TABLET EVERY DAY, Disp: 90 tablet, Rfl: 3 .  amiodarone (PACERONE) 200 MG tablet, Take 2 tablets (400 mg total) by mouth 2 (two) times daily. X 7 days, then decrease to 200 mg BID x 7 days, then decrease to 200 mg QD (Patient not taking: Reported on 07/08/2015), Disp: 90 tablet, Rfl: 1 .  diazepam (VALIUM) 5 MG tablet, Take 1 tablet (5 mg total) by mouth every 6 (six) hours as needed for anxiety., Disp: 30 tablet, Rfl: 0 .  fexofenadine (ALLEGRA) 180 MG tablet, Take 180 mg by mouth 2 (two) times a week., Disp: , Rfl:   Past Medical History: Past Medical History  Diagnosis Date  . CAD (coronary artery disease)     a. s/p DES to LAD and RCA (2001/2005);  b. 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  Marland Kitchen. Hyperlipidemia, mixed   . Essential  hypertension   . ED (erectile dysfunction) of non-organic origin   . History of pneumonia   . Kidney stones   . Arthritis   . Leg cramps   . Post-operative Atrial Fibrillation     Tobacco Use: History  Smoking status  . Never Smoker   Smokeless tobacco  . Not on file    Comment: tobacco use- no    Labs: Recent Review Flowsheet Data    Labs for ITP Cardiac and Pulmonary Rehab Latest Ref Rng 05/10/2015 05/10/2015 05/10/2015 05/11/2015 05/11/2015   PHART 7.350 - 7.450 7.380 7.340(L) - 7.388 -   PCO2ART 35.0 - 45.0 mmHg 42.0 44.9 - 39.4 -   HCO3 20.0 - 24.0 mEq/L 24.7(H) 24.0 - 23.6 -   TCO2 0 - 100 mmol/L 26 25 24 25 19    ACIDBASEDEF 0.0 - 2.0 mmol/L - 2.0 - 1.0 -   O2SAT - 97.0 95.0 - 91.0 -       Exercise Target Goals: Date: 07/08/15  Exercise Program Goal: Individual exercise prescription set with THRR, safety & activity barriers. Participant demonstrates ability to understand and report RPE using BORG scale, to self-measure pulse accurately, and to acknowledge the importance of the exercise prescription.  Exercise Prescription Goal: Starting with aerobic activity 30 plus minutes a day, 3 days per week for initial exercise prescription. Provide home exercise prescription and guidelines that  participant acknowledges understanding prior to discharge.  Activity Barriers & Risk Stratification:     Activity Barriers & Risk Stratification - 07/08/15 1453    Activity Barriers & Risk Stratification   Activity Barriers Other (comment)  Back problems      6 Minute Walk:     6 Minute Walk      07/08/15 1416       6 Minute Walk   Phase Initial     Distance 1090 feet     Walk Time 6 minutes     Resting HR 80 bpm     Resting BP 128/80 mmHg     Max Ex. HR 102 bpm     Max Ex. BP 128/78 mmHg     RPE 12     Perceived Dyspnea  2     Symptoms No        Initial Exercise Prescription:     Initial Exercise Prescription - 07/08/15 1400    Date of Initial Exercise  Prescription   Date 07/08/15   Treadmill   MPH 1.8   Grade 0   Minutes 10   Recumbant Bike   Level 2   RPM 40   Watts 20   Minutes 10   NuStep   Level 2   Watts 50   Minutes 10   Arm Ergometer   Level 1   Watts 10   Minutes 10   Recumbant Elliptical   Level 2   RPM 40   Watts 20   Minutes 10   REL-XR   Level 3   Watts 50   Minutes 10   T5 Nustep   Level 1   Watts 20   Minutes 10   Biostep-RELP   Level 2   Watts 45   Minutes 10   Prescription Details   Frequency (times per week) 3   Duration Progress to 30 minutes of continuous aerobic without signs/symptoms of physical distress   Intensity   THRR REST +  30   Ratings of Perceived Exertion 11-15   Perceived Dyspnea 2-4   Progression Continue progressive overload as per policy without signs/symptoms or physical distress.   Resistance Training   Training Prescription Yes   Weight 2   Reps 10-15      Exercise Prescription Changes:   Discharge Exercise Prescription (Final Exercise Prescription Changes):   Nutrition:  Target Goals: Understanding of nutrition guidelines, daily intake of sodium 1500mg , cholesterol 200mg , calories 30% from fat and 7% or less from saturated fats, daily to have 5 or more servings of fruits and vegetables.  Biometrics:     Pre Biometrics - 07/08/15 1419    Pre Biometrics   Height 5' 10.4" (1.788 m)   Weight 220 lb 8 oz (100.018 kg)   Waist Circumference 45 inches   Hip Circumference 44 inches   Waist to Hip Ratio 1.02 %   BMI (Calculated) 31.3       Nutrition Therapy Plan and Nutrition Goals:     Nutrition Therapy & Goals - 07/08/15 1458    Nutrition Therapy   Drug/Food Interactions Statins/Certain Fruits   Personal Nutrition Goals   Personal Goal #1 --  [name] said he would prefer not to meet individually with the Cardiac Rehab Registered dietician. "My wife died 9 year ago and I eat out alot-eat alot of grilled chicken.    Intervention Plan   Intervention  Using nutrition plan and personal goals to gain a healthy nutrition lifestyle. Add  exercise as prescribed.      Nutrition Discharge: Rate Your Plate Scores:   Nutrition Goals Re-Evaluation:   Psychosocial: Target Goals: Acknowledge presence or absence of depression, maximize coping skills, provide positive support system. Participant is able to verbalize types and ability to use techniques and skills needed for reducing stress and depression.  Initial Review & Psychosocial Screening:     Initial Psych Review & Screening - 07/08/15 1500    Family Dynamics   Good Support System? Yes  Giavonni's wife died 9 years ago but his daughter lives nearby.    Barriers   Psychosocial barriers to participate in program There are no identifiable barriers or psychosocial needs.   Screening Interventions   Interventions Encouraged to exercise      Quality of Life Scores:     Quality of Life - 07/08/15 1501    Quality of Life Scores   Health/Function Pre 22.7 %   Socioeconomic Pre 26 %   Psych/Spiritual Pre 24.86 %   Family Pre 24 %   GLOBAL Pre 23.95 %      PHQ-9:     Recent Review Flowsheet Data    Depression screen [facility] 2/9 07/08/2015   Decreased Interest 0   Down, Depressed, Hopeless 0   PHQ - 2 Score 0   Altered sleeping 0   Tired, decreased energy 3   Change in appetite 0   Feeling bad or failure about yourself  0   Trouble concentrating 0   Moving slowly or fidgety/restless 0   Suicidal thoughts 0   PHQ-9 Score 3   Difficult doing work/chores Somewhat difficult      Psychosocial Evaluation and Intervention:   Psychosocial Re-Evaluation:   Vocational Rehabilitation: Provide vocational rehab assistance to qualifying candidates.   Vocational Rehab Evaluation & Intervention:     Vocational Rehab - 07/08/15 1455    Initial Vocational Rehab Evaluation & Intervention   Assessment shows need for Vocational Rehabilitation No      Education: Education Goals:  Education classes will be provided on a weekly basis, covering required topics. Participant will state understanding/return demonstration of topics presented.  Learning Barriers/Preferences:     Learning Barriers/Preferences - 07/08/15 1454    Learning Barriers/Preferences   Learning Barriers None   Learning Preferences Written Material      Education Topics: General Nutrition Guidelines/Fats and Fiber: -Group instruction provided by verbal, written material, models and posters to present the general guidelines for heart healthy nutrition. Gives an explanation and review of dietary fats and fiber.   Controlling Sodium/Reading Food Labels: -Group verbal and written material supporting the discussion of sodium use in heart healthy nutrition. Review and explanation with models, verbal and written materials for utilization of the food label.   Exercise Physiology & Risk Factors: - Group verbal and written instruction with models to review the exercise physiology of the cardiovascular system and associated critical values. Details cardiovascular disease risk factors and the goals associated with each risk factor.   Aerobic Exercise & Resistance Training: - Gives group verbal and written discussion on the health impact of inactivity. On the components of aerobic and resistive training programs and the benefits of this training and how to safely progress through these programs.   Flexibility, Balance, General Exercise Guidelines: - Provides group verbal and written instruction on the benefits of flexibility and balance training programs. Provides general exercise guidelines with specific guidelines to those with heart or lung disease. Demonstration and skill practice provided.   Stress  Management: - Provides group verbal and written instruction about the health risks of elevated stress, cause of high stress, and healthy ways to reduce stress.   Depression: - Provides group verbal and  written instruction on the correlation between heart/lung disease and depressed mood, treatment options, and the stigmas associated with seeking treatment.   Anatomy & Physiology of the Heart: - Group verbal and written instruction and models provide basic cardiac anatomy and physiology, with the coronary electrical and arterial systems. Review of: AMI, Angina, Valve disease, Heart Failure, Cardiac Arrhythmia, Pacemakers, and the ICD.   Cardiac Procedures: - Group verbal and written instruction and models to describe the testing methods done to diagnose heart disease. Reviews the outcomes of the test results. Describes the treatment choices: Medical Management, Angioplasty, or Coronary Bypass Surgery.   Cardiac Medications: - Group verbal and written instruction to review commonly prescribed medications for heart disease. Reviews the medication, class of the drug, and side effects. Includes the steps to properly store meds and maintain the prescription regimen.   Go Sex-Intimacy & Heart Disease, Get SMART - Goal Setting: - Group verbal and written instruction through game format to discuss heart disease and the return to sexual intimacy. Provides group verbal and written material to discuss and apply goal setting through the application of the S.M.A.R.T. Method.   Other Matters of the Heart: - Provides group verbal, written materials and models to describe Heart Failure, Angina, Valve Disease, and Diabetes in the realm of heart disease. Includes description of the disease process and treatment options available to the cardiac patient.   Exercise & Equipment Safety: - Individual verbal instruction and demonstration of equipment use and safety with use of the equipment.          Cardiac Rehab from 07/08/2015 in [facility] Cardiac and Pulmonary Rehab   Date  07/08/15   Educator  C. EnterkinRN   Instruction Review Code  1- partially meets, needs review/practice      Infection Prevention: -  Provides verbal and written material to individual with discussion of infection control including proper hand washing and proper equipment cleaning during exercise session.      Cardiac Rehab from 07/08/2015 in [facility] Cardiac and Pulmonary Rehab   Date  07/08/15   Educator  C. Enterkin,RN   Instruction Review Code  2- meets goals/outcomes      Falls Prevention: - Provides verbal and written material to individual with discussion of falls prevention and safety.      Cardiac Rehab from 07/08/2015 in [facility] Cardiac and Pulmonary Rehab   Date  07/08/15   Educator  C. Enterkin,RN   Instruction Review Code  2- meets goals/outcomes      Diabetes: - Individual verbal and written instruction to review signs/symptoms of diabetes, desired ranges of glucose level fasting, after meals and with exercise. Advice that pre and post exercise glucose checks will be done for 3 sessions at entry of program.    Knowledge Questionnaire Score:     Knowledge Questionnaire Score - 07/08/15 1455    Knowledge Questionnaire Score   Pre Score 24      Personal Goals and Risk Factors at Admission:     Personal Goals and Risk Factors at Admission - 07/08/15 1500    Personal Goals and Risk Factors on Admission   Increase Aerobic Exercise and Physical Activity No;Yes   Intervention While in program, learn and follow the exercise prescription taught. Start at a low level workload and increase workload after able  to maintain previous level for 30 minutes. Increase time before increasing intensity.   Diabetes No   Hypertension Yes   Goal Participant will see blood pressure controlled within the values of 140/35mm/Hg or within value directed by their physician.   Intervention Provide nutrition & aerobic exercise along with prescribed medications to achieve BP 140/90 or less.   Lipids Yes   Goal Cholesterol controlled with medications as prescribed, with individualized exercise RX and with personalized nutrition  plan. Value goals: LDL < 70mg , HDL > 40mg . Participant states understanding of desired cholesterol values and following prescriptions.   Intervention Provide nutrition & aerobic exercise along with prescribed medications to achieve LDL 70mg , HDL >40mg .      Personal Goals and Risk Factors Review:    Personal Goals Discharge (Final Personal Goals and Risk Factors Review):    ITP Comments:     ITP Comments      07/11/15 1217           ITP Comments Ready for 30 day review.  Continue with ITP  Has attended orientation, will start sessions first week of Jan.          Comments:

## 2015-07-14 ENCOUNTER — Encounter: Payer: Medicare Other | Attending: Cardiovascular Disease

## 2015-07-14 DIAGNOSIS — Z951 Presence of aortocoronary bypass graft: Secondary | ICD-10-CM | POA: Insufficient documentation

## 2015-07-14 NOTE — Addendum Note (Signed)
Addended by: Rudy JewBICE, Judeth Gilles P on: 07/14/2015 11:29 AM   Modules accepted: Orders

## 2015-07-14 NOTE — Addendum Note (Signed)
Addended by: Rudy JewBICE, SUSANNE P on: 07/14/2015 10:54 AM   Modules accepted: Orders

## 2015-07-14 NOTE — Progress Notes (Signed)
Daily Session Note  Patient Details  Name: NABOR THOMANN MRN: [id number] Date of Birth: 04-11-1941 Referring Provider:  Minna Merritts, MD  Encounter Date: 07/14/2015  Check In:     Session Check In - 07/14/15 0828    Check-In   Staff Present Heath Lark, RN, BSN, CCRP;Renee Dillard Essex, MS, ACSM CEP, Exercise Physiologist;Remijio Holleran, BS, ACSM EP-C, Exercise Physiologist   ER physicians immediately available to respond to emergencies See telemetry face sheet for immediately available ER MD   Medication changes reported     No   Fall or balance concerns reported    No   Warm-up and Cool-down Performed on first and last piece of equipment   VAD Patient? No   Pain Assessment   Currently in Pain? No/denies         Goals Met:  Proper associated with RPD/PD & O2 Sat Exercise tolerated well No report of cardiac concerns or symptoms Strength training completed today  Goals Unmet:  Not Applicable  Goals Comments:    Dr. Emily Filbert is Medical Director for [place] and [facility].

## 2015-07-16 ENCOUNTER — Encounter: Payer: Medicare Other | Admitting: *Deleted

## 2015-07-16 DIAGNOSIS — Z951 Presence of aortocoronary bypass graft: Secondary | ICD-10-CM

## 2015-07-16 NOTE — Progress Notes (Signed)
Daily Session Note  Patient Details  Name: [name] MRN: [id number] Date of Birth: 1941/05/11 Referring Provider:  Minna Merritts, MD  Encounter Date: 07/16/2015  Check In:     Session Check In - 07/16/15 0936    Check-In   Staff Present Candiss Norse, MS, ACSM CEP, Exercise Physiologist;Carroll Enterkin, RN, BSN;Susanne Bice, RN, BSN, [place]   ER physicians immediately available to respond to emergencies See telemetry face sheet for immediately available ER MD   Medication changes reported     No   Fall or balance concerns reported    No   Warm-up and Cool-down Performed on first and last piece of equipment   VAD Patient? No   Pain Assessment   Currently in Pain? No/denies         Goals Met:  Exercise tolerated well No report of cardiac concerns or symptoms Strength training completed today  Goals Unmet:  Not Applicable  Goals Comments: New to program. Doing well with exercise regimen.    Dr. Emily Filbert is Medical Director for [place] and [facility].

## 2015-07-21 DIAGNOSIS — Z951 Presence of aortocoronary bypass graft: Secondary | ICD-10-CM | POA: Diagnosis not present

## 2015-07-21 NOTE — Progress Notes (Signed)
Daily Session Note  Patient Details  Name: [name] MRN: [id number] Date of Birth: Jan 20, 1941 Referring Provider:  Minna Merritts, MD  Encounter Date: 07/21/2015  Check In:     Session Check In - 07/21/15 0845    Check-In   Staff Present Heath Lark, RN, BSN, CCRP;Renee Dillard Essex, MS, ACSM CEP, Exercise Physiologist;Meredeth Furber, BS, ACSM EP-C, Exercise Physiologist   ER physicians immediately available to respond to emergencies See telemetry face sheet for immediately available ER MD   Medication changes reported     No   Fall or balance concerns reported    No   Warm-up and Cool-down Performed on first and last piece of equipment   VAD Patient? No   Pain Assessment   Currently in Pain? No/denies         Goals Met:  Proper associated with RPD/PD & O2 Sat Exercise tolerated well No report of cardiac concerns or symptoms Strength training completed today  Goals Unmet:  Not Applicable  Goals Comments:    Dr. Emily Filbert is Medical Director for [place] and [facility].

## 2015-07-23 ENCOUNTER — Encounter: Payer: Medicare Other | Admitting: *Deleted

## 2015-07-23 DIAGNOSIS — Z951 Presence of aortocoronary bypass graft: Secondary | ICD-10-CM

## 2015-07-23 NOTE — Progress Notes (Signed)
Daily Session Note  Patient Details  Name: [name] MRN: [id number] Date of Birth: Aug 19, 1940 Referring Provider:  Minna Merritts, MD  Encounter Date: 07/23/2015  Check In:     Session Check In - 07/23/15 1153    Check-In   Staff Present Candiss Norse, MS, ACSM CEP, Exercise Physiologist;Stacey Blanch Media, RRT, RCP, Respiratory Therapist;Jaquay Morneault, RN, BSN, [place]   ER physicians immediately available to respond to emergencies See telemetry face sheet for immediately available ER MD   Medication changes reported     No   Fall or balance concerns reported    No   Warm-up and Cool-down Performed on first and last piece of equipment   VAD Patient? No   Pain Assessment   Currently in Pain? No/denies         Goals Met:  Independence with exercise equipment Exercise tolerated well No report of cardiac concerns or symptoms  Goals Unmet:  Not Applicable  Goals Comments: Doing well with exercise prescription progression.      Dr. Emily Filbert is Medical Director for [place] and [facility].

## 2015-07-26 ENCOUNTER — Ambulatory Visit: Payer: Medicare Other

## 2015-07-26 ENCOUNTER — Encounter: Payer: Medicare Other | Admitting: *Deleted

## 2015-07-26 DIAGNOSIS — Z951 Presence of aortocoronary bypass graft: Secondary | ICD-10-CM

## 2015-07-26 NOTE — Progress Notes (Signed)
Daily Session Note  Patient Details  Name: [name] MRN: [id number] Date of Birth: October 10, 1940 Referring Provider:  Minna Merritts, MD  Encounter Date: 07/26/2015  Check In:     Session Check In - 07/26/15 0824    Check-In   Staff Present Candiss Norse, MS, ACSM CEP, Exercise Physiologist;Susanne Bice, RN, BSN, Laveda Norman, BS, ACSM CEP, Exercise Physiologist   ER physicians immediately available to respond to emergencies See telemetry face sheet for immediately available ER MD   Medication changes reported     No   Fall or balance concerns reported    No   Warm-up and Cool-down Performed on first and last piece of equipment   VAD Patient? No   Pain Assessment   Currently in Pain? No/denies   Multiple Pain Sites No         Goals Met:  Independence with exercise equipment Exercise tolerated well No report of cardiac concerns or symptoms Strength training completed today  Goals Unmet:  Not Applicable  Goals Comments: Patient completed exercise prescription and all exercise goals during rehab session. The exercise was tolerated well and the patient is progressing in the program.     Dr. Emily Filbert is Medical Director for [place] and [facility].

## 2015-07-28 DIAGNOSIS — Z951 Presence of aortocoronary bypass graft: Secondary | ICD-10-CM

## 2015-07-28 NOTE — Progress Notes (Signed)
Daily Session Note  Patient Details  Name: [name] MRN: [id number] Date of Birth: 03/08/1941 Referring Provider:  Minna Merritts, MD  Encounter Date: 07/28/2015  Check In:     Session Check In - 07/28/15 0907    Check-In   Staff Present Heath Lark, RN, BSN, CCRP;Renee Dillard Essex, MS, ACSM CEP, Exercise Physiologist;Emelia Sandoval, BS, ACSM EP-C, Exercise Physiologist   ER physicians immediately available to respond to emergencies See telemetry face sheet for immediately available ER MD   Medication changes reported     No   Fall or balance concerns reported    No   Warm-up and Cool-down Performed on first and last piece of equipment   VAD Patient? No   Pain Assessment   Currently in Pain? No/denies         Goals Met:  Proper associated with RPD/PD & O2 Sat Exercise tolerated well No report of cardiac concerns or symptoms Strength training completed today  Goals Unmet:  Not Applicable  Goals Comments:    Dr. Emily Filbert is Medical Director for [place] and [facility].

## 2015-07-30 ENCOUNTER — Encounter: Payer: Medicare Other | Admitting: *Deleted

## 2015-07-30 DIAGNOSIS — Z951 Presence of aortocoronary bypass graft: Secondary | ICD-10-CM

## 2015-07-30 NOTE — Progress Notes (Signed)
Daily Session Note  Patient Details  Name: [name] MRN: [id number] Date of Birth: [birth year] Referring Provider:  Minna Merritts, MD  Encounter Date: 07/30/2015  Check In:     Session Check In - 07/30/15 0904    Check-In   Staff Present Heath Lark, RN, BSN, CCRP;Willadeen Colantuono, RN, Drusilla Kanner, MS, ACSM CEP, Exercise Physiologist   ER physicians immediately available to respond to emergencies See telemetry face sheet for immediately available ER MD   Medication changes reported     No   Fall or balance concerns reported    No   Warm-up and Cool-down Performed on first and last piece of equipment   VAD Patient? No   Pain Assessment   Currently in Pain? No/denies         Goals Met:  Proper associated with RPD/PD & O2 Sat Exercise tolerated well  Goals Unmet:  Not Applicable  Goals Comments:   Dr. Emily Filbert is Medical Director for [place] and [facility].

## 2015-08-02 ENCOUNTER — Encounter: Payer: Medicare Other | Admitting: *Deleted

## 2015-08-02 DIAGNOSIS — Z951 Presence of aortocoronary bypass graft: Secondary | ICD-10-CM | POA: Diagnosis not present

## 2015-08-02 NOTE — Progress Notes (Signed)
Daily Session Note  Patient Details  Name: [name] MRN: [id number] Date of Birth: 1940/08/28 Referring Provider:  Idelle Crouch, MD  Encounter Date: 08/02/2015  Check In:     Session Check In - 08/02/15 0827    Check-In   Staff Present Candiss Norse, MS, ACSM CEP, Exercise Physiologist;Susanne Bice, RN, BSN, Laveda Norman, BS, ACSM CEP, Exercise Physiologist   ER physicians immediately available to respond to emergencies See telemetry face sheet for immediately available ER MD   Medication changes reported     No   Fall or balance concerns reported    No   Warm-up and Cool-down Performed on first and last piece of equipment   VAD Patient? No   Pain Assessment   Currently in Pain? No/denies   Multiple Pain Sites No         Goals Met:  Independence with exercise equipment Exercise tolerated well No report of cardiac concerns or symptoms Strength training completed today  Goals Unmet:  Not Applicable  Goals Comments: Patient completed exercise prescription and all exercise goals during rehab session. The exercise was tolerated well and the patient is progressing in the program.    Dr. Emily Filbert is Medical Director for [place] and [facility].

## 2015-08-04 DIAGNOSIS — Z951 Presence of aortocoronary bypass graft: Secondary | ICD-10-CM

## 2015-08-04 NOTE — Progress Notes (Signed)
Daily Session Note  Patient Details  Name: [name] MRN: [id number] Date of Birth: 1941/01/03 Referring Provider:  Minna Merritts, MD  Encounter Date: 08/04/2015  Check In:     Session Check In - 08/04/15 0817    Check-In   Staff Present Heath Lark, RN, BSN, CCRP;Mary Kellie Shropshire, RN;Marrell Dicaprio, BS, ACSM EP-C, Exercise Physiologist   ER physicians immediately available to respond to emergencies See telemetry face sheet for immediately available ER MD   Medication changes reported     No   Fall or balance concerns reported    No   Warm-up and Cool-down Performed on first and last piece of equipment   VAD Patient? No   Pain Assessment   Currently in Pain? No/denies         Goals Met:  Proper associated with RPD/PD & O2 Sat Exercise tolerated well No report of cardiac concerns or symptoms Strength training completed today  Goals Unmet:  Not Applicable  Goals Comments:    Dr. Emily Filbert is Medical Director for [facility].

## 2015-08-05 NOTE — Progress Notes (Signed)
Cardiac Individual Treatment Plan  Patient Details  Name: [name] MRN: [id number] Date of Birth: Nov 16, 1940 Referring Provider:  Minna Merritts, MD  Initial Encounter Date:    Visit Diagnosis: S/P CABG x 4  Patient's Home Medications on Admission:  Current outpatient prescriptions:  .  amiodarone (PACERONE) 200 MG tablet, Take 2 tablets (400 mg total) by mouth 2 (two) times daily. X 7 days, then decrease to 200 mg BID x 7 days, then decrease to 200 mg QD (Patient not taking: Reported on 07/08/2015), Disp: 90 tablet, Rfl: 1 .  aspirin 81 MG tablet, Take 81 mg by mouth daily.  , Disp: , Rfl:  .  clopidogrel (PLAVIX) 75 MG tablet, TAKE 1 TABLET EVERY DAY, Disp: 90 tablet, Rfl: 3 .  diazepam (VALIUM) 5 MG tablet, Take 1 tablet (5 mg total) by mouth every 6 (six) hours as needed for anxiety., Disp: 30 tablet, Rfl: 0 .  ferrous [id number] C-folic acid (TRINSICON / FOLTRIN) capsule, Take 1 capsule by mouth 3 (three) times daily after meals., Disp: 90 capsule, Rfl: 0 .  fexofenadine (ALLEGRA) 180 MG tablet, Take 180 mg by mouth 2 (two) times a week., Disp: , Rfl:  .  lisinopril (PRINIVIL,ZESTRIL) 2.5 MG tablet, Take 1 tablet (2.5 mg total) by mouth daily., Disp: 30 tablet, Rfl: 3 .  metoprolol tartrate (LOPRESSOR) 25 MG tablet, Take 0.5 tablets (12.5 mg total) by mouth 2 (two) times daily., Disp: 30 tablet, Rfl: 3 .  Multiple Vitamins-Minerals (CENTRUM PO), Take by mouth daily.  , Disp: , Rfl:  .  Omega-3 Fatty Acids (FISH OIL) 1000 MG CAPS, Take 1,000 mg by mouth 2 (two) times daily., Disp: , Rfl:  .  simvastatin (ZOCOR) 40 MG tablet, TAKE 1 TABLET EVERY DAY, Disp: 90 tablet, Rfl: 3  Past Medical History: Past Medical History  Diagnosis Date  . CAD (coronary artery disease)     a. s/p DES to LAD and RCA (2001/2005);  b. 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  Marland Kitchen Hyperlipidemia, mixed   . Essential hypertension   . ED (erectile dysfunction) of non-organic  origin   . History of pneumonia   . Kidney stones   . Arthritis   . Leg cramps   . Post-operative Atrial Fibrillation     Tobacco Use: History  Smoking status  . Never Smoker   Smokeless tobacco  . Not on file    Comment: tobacco use- no    Labs: Recent Review Flowsheet Data    Labs for ITP Cardiac and Pulmonary Rehab Latest Ref Rng 05/10/2015 05/10/2015 05/10/2015 05/11/2015 05/11/2015   PHART 7.350 - 7.450 7.380 7.340(L) - 7.388 -   PCO2ART 35.0 - 45.0 mmHg 42.0 44.9 - 39.4 -   HCO3 20.0 - 24.0 mEq/L 24.7(H) 24.0 - 23.6 -   TCO2 0 - 100 mmol/L _0 ACIDBASEDEF 0.0 - 2.0 mmol/L - 2.0 - 1.0 -   O2SAT - 97.0 95.0 - 91.0 -       Exercise Target Goals:    Exercise Program Goal: Individual exercise prescription set with THRR, safety & activity barriers. Participant demonstrates ability to understand and report RPE using BORG scale, to self-measure pulse accurately, and to acknowledge the importance of the exercise prescription.  Exercise Prescription Goal: Starting with aerobic activity 30 plus minutes a day, 3 days per week for initial exercise prescription. Provide home exercise prescription and guidelines that participant acknowledges understanding prior to discharge.  Activity Barriers & Risk Stratification:     Activity Barriers & Risk Stratification - 07/08/15 1453    Activity Barriers & Risk Stratification   Activity Barriers Other (comment)  Back problems      6 Minute Walk:     6 Minute Walk      07/08/15 1416       6 Minute Walk   Phase Initial     Distance 1090 feet     Walk Time 6 minutes     Resting HR 80 bpm     Resting BP 128/80 mmHg     Max Ex. HR 102 bpm     Max Ex. BP 128/78 mmHg     RPE 12     Perceived Dyspnea  2     Symptoms No        Initial Exercise Prescription:     Initial Exercise Prescription - 07/08/15 1400    Date of Initial Exercise Prescription   Date 07/08/15   Treadmill   MPH 1.8   Grade 0   Minutes  10   Recumbant Bike   Level 2   RPM 40   Watts 20   Minutes 10   NuStep   Level 2   Watts 50   Minutes 10   Arm Ergometer   Level 1   Watts 10   Minutes 10   Recumbant Elliptical   Level 2   RPM 40   Watts 20   Minutes 10   REL-XR   Level 3   Watts 50   Minutes 10   T5 Nustep   Level 1   Watts 20   Minutes 10   Biostep-RELP   Level 2   Watts 45   Minutes 10   Prescription Details   Frequency (times per week) 3   Duration Progress to 30 minutes of continuous aerobic without signs/symptoms of physical distress   Intensity   THRR REST +  30   Ratings of Perceived Exertion 11-15   Perceived Dyspnea 2-4   Progression Continue progressive overload as per policy without signs/symptoms or physical distress.   Resistance Training   Training Prescription Yes   Weight 2   Reps 10-15      Exercise Prescription Changes:     Exercise Prescription Changes      07/16/15 1100 07/26/15 0723 08/04/15 0800       Exercise Review   Progression Yes Yes Yes     Response to Exercise   Blood Pressure (Admit)  150/90 mmHg      Blood Pressure (Exercise)  144/84 mmHg      Blood Pressure (Exit)  116/64 mmHg      Heart Rate (Admit)  75 bpm      Heart Rate (Exercise)  101 bpm      Heart Rate (Exit)  84 bpm      Rating of Perceived Exertion (Exercise)  11      Symptoms None None None     Comments Today was [name]'s second day of exercise.Reviewed orientation to the gym and the equipment functions and settings. Procedures and policies of the gym were outlined and explained. The patient's individual exercise prescription and treatment plan were reviewed with them. All starting workloads were established based on the results of the functional testing  done at the initial intake visit. The plan for exercise progression was also introduced and progression will be customized based on the patient's performance and goals. [name] has a  long history of being physically active and can exercise  safely for the full class time. He is also exercising at home and this has been approved by his MD. He mostly walks at home on the treadmill. He does not use heavy weights at home at this time due to MD's orders. He is allowed to lift anything under 15 lbs. We will continually progress him in the program using intensity.  Reviewed individualized exercise prescription and made increases per departmental policy. Exercise increases were discussed with the patient and they were able to perform the new work loads without issue (no signs or symptoms).       Frequency Add 1 additional day to program exercise sessions. Add 3 additional days to program exercise sessions.  Jawon walks and lifts weights at home Add 3 additional days to program exercise sessions.  Raven walks and lifts weights at home     Duration Progress to 50 minutes of aerobic without signs/symptoms of physical distress Progress to 50 minutes of aerobic without signs/symptoms of physical distress Progress to 50 minutes of aerobic without signs/symptoms of physical distress     Intensity Rest + 30 Rest + 30 Rest + 30     Progression Continue progressive overload as per policy without signs/symptoms or physical distress. Continue progressive overload as per policy without signs/symptoms or physical distress. Continue progressive overload as per policy without signs/symptoms or physical distress.     Resistance Training   Training Prescription Yes Yes Yes     Weight _0 Reps 10-15 10-15 10-15     Treadmill   MPH 3.6 3.6 3.6     Grade _1 Minutes 45 45 60        Discharge Exercise Prescription (Final Exercise Prescription Changes):     Exercise Prescription Changes - 08/04/15 0800    Exercise Review   Progression Yes   Response to Exercise   Symptoms None   Frequency Add 3 additional days to program exercise sessions.  Che walks and lifts weights at home   Duration Progress to 50 minutes of aerobic without  signs/symptoms of physical distress   Intensity Rest + 30   Progression Continue progressive overload as per policy without signs/symptoms or physical distress.   Resistance Training   Training Prescription Yes   Weight 10   Reps 10-15   Treadmill   MPH 3.6   Grade 6   Minutes 60      Nutrition:  Target Goals: Understanding of nutrition guidelines, daily intake of sodium <1515m, cholesterol <2060m calories 30% from fat and 7% or less from saturated fats, daily to have 5 or more servings of fruits and vegetables.  Biometrics:     Pre Biometrics - 07/08/15 1419    Pre Biometrics   Height 5' 10.4" (1.788 m)   Weight 220 lb 8 oz (100.018 kg)   Waist Circumference 45 inches   Hip Circumference 44 inches   Waist to Hip Ratio 1.02 %   BMI (Calculated) 31.3       Nutrition Therapy Plan and Nutrition Goals:     Nutrition Therapy & Goals - 07/08/15 1458    Nutrition Therapy   Drug/Food Interactions Statins/Certain Fruits   Personal Nutrition Goals   Personal Goal #1 --  HeJihaadaid he would prefer not to meet individually with the Cardiac Rehab Registered dietician. "My wife died 9 year ago and I eat out alot-eat alot of  grilled chicken.    Intervention Plan   Intervention Using nutrition plan and personal goals to gain a healthy nutrition lifestyle. Add exercise as prescribed.      Nutrition Discharge: Rate Your Plate Scores:     Rate Your Plate - 01/07/15 0109    Rate Your Plate Scores   Pre Score 70   Pre Score % 78 %      Nutrition Goals Re-Evaluation:   Psychosocial: Target Goals: Acknowledge presence or absence of depression, maximize coping skills, provide positive support system. Participant is able to verbalize types and ability to use techniques and skills needed for reducing stress and depression.  Initial Review & Psychosocial Screening:     Initial Psych Review & Screening - 07/08/15 1500    Family Dynamics   Good Support System? Yes  Judy's  wife died 55 years ago but his daughter lives nearby.    Barriers   Psychosocial barriers to participate in program There are no identifiable barriers or psychosocial needs.   Screening Interventions   Interventions Encouraged to exercise      Quality of Life Scores:     Quality of Life - 07/08/15 1501    Quality of Life Scores   Health/Function Pre 22.7 %   Socioeconomic Pre 26 %   Psych/Spiritual Pre 24.86 %   Family Pre 24 %   GLOBAL Pre 23.95 %      PHQ-9:     Recent Review Flowsheet Data    Depression screen [facility] 2/9 07/08/2015   Decreased Interest 0   Down, Depressed, Hopeless 0   PHQ - 2 Score 0   Altered sleeping 0   Tired, decreased energy 3   Change in appetite 0   Feeling bad or failure about yourself  0   Trouble concentrating 0   Moving slowly or fidgety/restless 0   Suicidal thoughts 0   PHQ-9 Score 3   Difficult doing work/chores Somewhat difficult      Psychosocial Evaluation and Intervention:     Psychosocial Evaluation - 08/02/15 1010    Psychosocial Evaluation & Interventions   Interventions Encouraged to exercise with the program and follow exercise prescription   Comments Counselor met with Mr. Morrical today for his initial psychosocial evaluation.  He is a 75 year old who had quadruple bypass surgery on 05/10/15.  He has a strong support system with (2) adult daughters who live close by and check on him frequently, as well as active involvement in his local faith community.  Mr. Stiff reports sleeping well and having a good appetite.  He denies a history of depression or anxiety or current symtpoms.  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  Mr. Votta has been walking on the treadmill for years consistently and his goal for this program is to increase his distance and speed while being monitored, so he can return to doing it on his own.   Counselor encouraged Mr. Lichtenwalner to pace himself during his recovery time.     Continued Psychosocial Services Needed Yes  Mr. Lavalley will benefit from the stress management educational component of this program in particular.        Psychosocial Re-Evaluation:   Vocational Rehabilitation: Provide vocational rehab assistance to qualifying candidates.   Vocational Rehab Evaluation & Intervention:     Vocational Rehab - 07/08/15 1455  Initial Vocational Rehab Evaluation & Intervention   Assessment shows need for Vocational Rehabilitation No      Education: Education Goals: Education classes will be provided on a weekly basis, covering required topics. Participant will state understanding/return demonstration of topics presented.  Learning Barriers/Preferences:     Learning Barriers/Preferences - 07/08/15 1454    Learning Barriers/Preferences   Learning Barriers None   Learning Preferences Written Material      Education Topics: General Nutrition Guidelines/Fats and Fiber: -Group instruction provided by verbal, written material, models and posters to present the general guidelines for heart healthy nutrition. Gives an explanation and review of dietary fats and fiber.   Controlling Sodium/Reading Food Labels: -Group verbal and written material supporting the discussion of sodium use in heart healthy nutrition. Review and explanation with models, verbal and written materials for utilization of the food label.   Exercise Physiology & Risk Factors: - Group verbal and written instruction with models to review the exercise physiology of the cardiovascular system and associated critical values. Details cardiovascular disease risk factors and the goals associated with each risk factor.          Cardiac Rehab from 08/04/2015 in [facility] Cardiac and Pulmonary Rehab   Date  07/14/15   Educator  RM   Instruction Review Code  2- meets goals/outcomes      Aerobic Exercise &  Resistance Training: - Gives group verbal and written discussion on the health impact of inactivity. On the components of aerobic and resistive training programs and the benefits of this training and how to safely progress through these programs.      Cardiac Rehab from 08/04/2015 in [facility] Cardiac and Pulmonary Rehab   Date  07/26/15   Educator  RM   Instruction Review Code  2- meets goals/outcomes      Flexibility, Balance, General Exercise Guidelines: - Provides group verbal and written instruction on the benefits of flexibility and balance training programs. Provides general exercise guidelines with specific guidelines to those with heart or lung disease. Demonstration and skill practice provided.      Cardiac Rehab from 08/04/2015 in [facility] Cardiac and Pulmonary Rehab   Date  07/26/15   Educator  RM   Instruction Review Code  2- meets goals/outcomes      Stress Management: - Provides group verbal and written instruction about the health risks of elevated stress, cause of high stress, and healthy ways to reduce stress.      Cardiac Rehab from 08/04/2015 in [facility] Cardiac and Pulmonary Rehab   Date  07/21/15   Educator  [facility]   Instruction Review Code  2- meets goals/outcomes      Depression: - Provides group verbal and written instruction on the correlation between heart/lung disease and depressed mood, treatment options, and the stigmas associated with seeking treatment.   Anatomy & Physiology of the Heart: - Group verbal and written instruction and models provide basic cardiac anatomy and physiology, with the coronary electrical and arterial systems. Review of: AMI, Angina, Valve disease, Heart Failure, Cardiac Arrhythmia, Pacemakers, and the ICD.      Cardiac Rehab from 08/04/2015 in [facility] Cardiac and Pulmonary Rehab   Date  08/02/15   Educator  SB   Instruction Review Code  2- meets goals/outcomes      Cardiac Procedures: - Group verbal and written instruction and models to  describe the testing methods done to diagnose heart disease. Reviews the outcomes of the test results. Describes the treatment choices: Medical Management, Angioplasty,  or Coronary Bypass Surgery.   Cardiac Medications: - Group verbal and written instruction to review commonly prescribed medications for heart disease. Reviews the medication, class of the drug, and side effects. Includes the steps to properly store meds and maintain the prescription regimen.   Go Sex-Intimacy & Heart Disease, Get SMART - Goal Setting: - Group verbal and written instruction through game format to discuss heart disease and the return to sexual intimacy. Provides group verbal and written material to discuss and apply goal setting through the application of the S.M.A.R.T. Method.   Other Matters of the Heart: - Provides group verbal, written materials and models to describe Heart Failure, Angina, Valve Disease, and Diabetes in the realm of heart disease. Includes description of the disease process and treatment options available to the cardiac patient.   Exercise & Equipment Safety: - Individual verbal instruction and demonstration of equipment use and safety with use of the equipment.      Cardiac Rehab from 08/04/2015 in [facility] Cardiac and Pulmonary Rehab   Date  07/08/15   Educator  C. EnterkinRN   Instruction Review Code  1- partially meets, needs review/practice      Infection Prevention: - Provides verbal and written material to individual with discussion of infection control including proper hand washing and proper equipment cleaning during exercise session.      Cardiac Rehab from 08/04/2015 in [facility] Cardiac and Pulmonary Rehab   Date  07/08/15   Educator  C. Enterkin,RN   Instruction Review Code  2- meets goals/outcomes      Falls Prevention: - Provides verbal and written material to individual with discussion of falls prevention and safety.      Cardiac Rehab from 08/04/2015 in [facility] Cardiac and  Pulmonary Rehab   Date  07/08/15   Educator  C. Enterkin,RN   Instruction Review Code  2- meets goals/outcomes      Diabetes: - Individual verbal and written instruction to review signs/symptoms of diabetes, desired ranges of glucose level fasting, after meals and with exercise. Advice that pre and post exercise glucose checks will be done for 3 sessions at entry of program.    Knowledge Questionnaire Score:     Knowledge Questionnaire Score - 07/08/15 1455    Knowledge Questionnaire Score   Pre Score 24      Personal Goals and Risk Factors at Admission:     Personal Goals and Risk Factors at Admission - 07/08/15 1500    Personal Goals and Risk Factors on Admission   Increase Aerobic Exercise and Physical Activity No;Yes   Intervention While in program, learn and follow the exercise prescription taught. Start at a low level workload and increase workload after able to maintain previous level for 30 minutes. Increase time before increasing intensity.   Diabetes No   Hypertension Yes   Goal Participant will see blood pressure controlled within the values of 140/32m/Hg or within value directed by their physician.   Intervention Provide nutrition & aerobic exercise along with prescribed medications to achieve BP 140/90 or less.   Lipids Yes   Goal Cholesterol controlled with medications as prescribed, with individualized exercise RX and with personalized nutrition plan. Value goals: LDL < 765m HDL > 4029mParticipant states understanding of desired cholesterol values and following prescriptions.   Intervention Provide nutrition & aerobic exercise along with prescribed medications to achieve LDL <41m61mDL >40mg37m   Personal Goals and Risk Factors Review:      Goals and Risk Factor  Review      07/16/15 3428 07/16/15 0941 07/27/15 0725       Increase Aerobic Exercise and Physical Activity   Goals Progress/Improvement seen    Yes     Comments Spoke with Joneen Caraway today to  infoem him that the Ex Phys will work with him on his exercise goals.  Fletcher is very experienced with exercise and lives a very active lifestyle. He has a treadmill at home and weights that he uses on days that he doesn't come to class. He has a routine and doesn't seem to want to change it. We will work with him on progressig in his routine or trying new things in order to provide his body with additional stimulus for improvement. We will follow up in February on how this progressing is going.      Hypertension   Goal Participant will see blood pressure controlled within the values of 140/54m/Hg or within value directed by their physician.       Comments Reviewed the factors for controlling his risk factor: meds,exercise and nutrition. Will contnue to follow with RJoneen Carawaythe steps he is completing to keep his risk factor under control. Correction name is not RJoneen Caraway but HSeaview      Abnormal Lipids   Progress seen towards goals Unknown       Comments Reviewed the factors for controlling his risk factor: meds,exercise and nutrition. Will contnue to follow with RJoneen Carawaythe steps he is completing to keep his risk factor under control. Correction name is not RJoneen Caraway but HMalaga      Stress   Comments Reviewed the factors for controlling his risk factor: meds,exercise and nutrition. Mental health counselor will talk to RJoneen Carawaysoon for further advice. Will contnue to follow with RJoneen Carawaythe steps he is completing to keep his risk factor under control. Correction name is not RJoneen Caraway but HUpland         Personal Goals Discharge (Final Personal Goals and Risk Factors Review):      Goals and Risk Factor Review - 07/27/15 0725    Increase Aerobic Exercise and Physical Activity   Goals Progress/Improvement seen  Yes   Comments HVeronis very experienced with exercise and lives a very active lifestyle. He has a treadmill at home and weights that he uses on days that he doesn't come to class. He has a  routine and doesn't seem to want to change it. We will work with him on progressig in his routine or trying new things in order to provide his body with additional stimulus for improvement. We will follow up in February on how this progressing is going.       ITP Comments:     ITP Comments      07/11/15 1217 08/05/15 1155         ITP Comments Ready for 30 day review.  Continue with ITP  Has attended orientation, will start sessions first week of Jan. Ready for 30 day review. Continue with ITP.         Comments:

## 2015-08-06 ENCOUNTER — Encounter: Payer: Medicare Other | Admitting: *Deleted

## 2015-08-06 DIAGNOSIS — Z951 Presence of aortocoronary bypass graft: Secondary | ICD-10-CM

## 2015-08-06 NOTE — Progress Notes (Signed)
Cardiac Individual Treatment Plan  Patient Details  Name: LEMOND GRIFFEE MRN: [id number] Date of Birth: Nov 16, 1975 Referring Provider:  Minna Merritts, MD  Initial Encounter Date:    Visit Diagnosis: S/P CABG x 4  Patient's Home Medications on Admission:  Current outpatient prescriptions:  .  amiodarone (PACERONE) 200 MG tablet, Take 2 tablets (400 mg total) by mouth 2 (two) times daily. X 7 days, then decrease to 200 mg BID x 7 days, then decrease to 200 mg QD (Patient not taking: Reported on 07/08/2015), Disp: 90 tablet, Rfl: 1 .  aspirin 81 MG tablet, Take 81 mg by mouth daily.  , Disp: , Rfl:  .  clopidogrel (PLAVIX) 75 MG tablet, TAKE 1 TABLET EVERY DAY, Disp: 90 tablet, Rfl: 3 .  diazepam (VALIUM) 5 MG tablet, Take 1 tablet (5 mg total) by mouth every 6 (six) hours as needed for anxiety., Disp: 30 tablet, Rfl: 0 .  ferrous [id number] C-folic acid (TRINSICON / FOLTRIN) capsule, Take 1 capsule by mouth 3 (three) times daily after meals., Disp: 90 capsule, Rfl: 0 .  fexofenadine (ALLEGRA) 180 MG tablet, Take 180 mg by mouth 2 (two) times a week., Disp: , Rfl:  .  lisinopril (PRINIVIL,ZESTRIL) 2.5 MG tablet, Take 1 tablet (2.5 mg total) by mouth daily., Disp: 30 tablet, Rfl: 3 .  metoprolol tartrate (LOPRESSOR) 25 MG tablet, Take 0.5 tablets (12.5 mg total) by mouth 2 (two) times daily., Disp: 30 tablet, Rfl: 3 .  Multiple Vitamins-Minerals (CENTRUM PO), Take by mouth daily.  , Disp: , Rfl:  .  Omega-3 Fatty Acids (FISH OIL) 1000 MG CAPS, Take 1,000 mg by mouth 2 (two) times daily., Disp: , Rfl:  .  simvastatin (ZOCOR) 40 MG tablet, TAKE 1 TABLET EVERY DAY, Disp: 90 tablet, Rfl: 3  Past Medical History: Past Medical History  Diagnosis Date  . CAD (coronary artery disease)     a. s/p DES to LAD and RCA (2001/2005);  b. 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  Marland Kitchen Hyperlipidemia, mixed   . Essential hypertension   . ED (erectile dysfunction) of non-organic  origin   . History of pneumonia   . Kidney stones   . Arthritis   . Leg cramps   . Post-operative Atrial Fibrillation     Tobacco Use: History  Smoking status  . Never Smoker   Smokeless tobacco  . Not on file    Comment: tobacco use- no    Labs: Recent Review Flowsheet Data    Labs for ITP Cardiac and Pulmonary Rehab Latest Ref Rng 05/10/2015 05/10/2015 05/10/2015 05/11/2015 05/11/2015   PHART 7.350 - 7.450 7.380 7.340(L) - 7.388 -   PCO2ART 35.0 - 45.0 mmHg 42.0 44.9 - 39.4 -   HCO3 20.0 - 24.0 mEq/L 24.7(H) 24.0 - 23.6 -   TCO2 0 - 100 mmol/L _0 ACIDBASEDEF 0.0 - 2.0 mmol/L - 2.0 - 1.0 -   O2SAT - 97.0 95.0 - 91.0 -       Exercise Target Goals:    Exercise Program Goal: Individual exercise prescription set with THRR, safety & activity barriers. Participant demonstrates ability to understand and report RPE using BORG scale, to self-measure pulse accurately, and to acknowledge the importance of the exercise prescription.  Exercise Prescription Goal: Starting with aerobic activity 30 plus minutes a day, 3 days per week for initial exercise prescription. Provide home exercise prescription and guidelines that participant acknowledges understanding prior to discharge.  Activity Barriers & Risk Stratification:     Activity Barriers & Risk Stratification - 07/08/15 1453    Activity Barriers & Risk Stratification   Activity Barriers Other (comment)  Back problems      6 Minute Walk:     6 Minute Walk      07/08/15 1416       6 Minute Walk   Phase Initial     Distance 1090 feet     Walk Time 6 minutes     Resting HR 80 bpm     Resting BP 128/80 mmHg     Max Ex. HR 102 bpm     Max Ex. BP 128/78 mmHg     RPE 12     Perceived Dyspnea  2     Symptoms No        Initial Exercise Prescription:     Initial Exercise Prescription - 07/08/15 1400    Date of Initial Exercise Prescription   Date 07/08/15   Treadmill   MPH 1.8   Grade 0   Minutes  10   Recumbant Bike   Level 2   RPM 40   Watts 20   Minutes 10   NuStep   Level 2   Watts 50   Minutes 10   Arm Ergometer   Level 1   Watts 10   Minutes 10   Recumbant Elliptical   Level 2   RPM 40   Watts 20   Minutes 10   REL-XR   Level 3   Watts 50   Minutes 10   T5 Nustep   Level 1   Watts 20   Minutes 10   Biostep-RELP   Level 2   Watts 45   Minutes 10   Prescription Details   Frequency (times per week) 3   Duration Progress to 30 minutes of continuous aerobic without signs/symptoms of physical distress   Intensity   THRR REST +  30   Ratings of Perceived Exertion 11-15   Perceived Dyspnea 2-4   Progression Continue progressive overload as per policy without signs/symptoms or physical distress.   Resistance Training   Training Prescription Yes   Weight 2   Reps 10-15      Exercise Prescription Changes:     Exercise Prescription Changes      07/16/15 1100 07/26/15 0723 08/04/15 0800       Exercise Review   Progression Yes Yes Yes     Response to Exercise   Blood Pressure (Admit)  150/90 mmHg      Blood Pressure (Exercise)  144/84 mmHg      Blood Pressure (Exit)  116/64 mmHg      Heart Rate (Admit)  75 bpm      Heart Rate (Exercise)  101 bpm      Heart Rate (Exit)  84 bpm      Rating of Perceived Exertion (Exercise)  11      Symptoms None None None     Comments Today was [name]'s second day of exercise.Reviewed orientation to the gym and the equipment functions and settings. Procedures and policies of the gym were outlined and explained. The patient's individual exercise prescription and treatment plan were reviewed with them. All starting workloads were established based on the results of the functional testing  done at the initial intake visit. The plan for exercise progression was also introduced and progression will be customized based on the patient's performance and goals. [name] has a  long history of being physically active and can exercise  safely for the full class time. He is also exercising at home and this has been approved by his MD. He mostly walks at home on the treadmill. He does not use heavy weights at home at this time due to MD's orders. He is allowed to lift anything under 15 lbs. We will continually progress him in the program using intensity.  Reviewed individualized exercise prescription and made increases per departmental policy. Exercise increases were discussed with the patient and they were able to perform the new work loads without issue (no signs or symptoms).       Frequency Add 1 additional day to program exercise sessions. Add 3 additional days to program exercise sessions.  Jawon walks and lifts weights at home Add 3 additional days to program exercise sessions.  Raven walks and lifts weights at home     Duration Progress to 50 minutes of aerobic without signs/symptoms of physical distress Progress to 50 minutes of aerobic without signs/symptoms of physical distress Progress to 50 minutes of aerobic without signs/symptoms of physical distress     Intensity Rest + 30 Rest + 30 Rest + 30     Progression Continue progressive overload as per policy without signs/symptoms or physical distress. Continue progressive overload as per policy without signs/symptoms or physical distress. Continue progressive overload as per policy without signs/symptoms or physical distress.     Resistance Training   Training Prescription Yes Yes Yes     Weight _0 Reps 10-15 10-15 10-15     Treadmill   MPH 3.6 3.6 3.6     Grade _1 Minutes 45 45 60        Discharge Exercise Prescription (Final Exercise Prescription Changes):     Exercise Prescription Changes - 08/04/15 0800    Exercise Review   Progression Yes   Response to Exercise   Symptoms None   Frequency Add 3 additional days to program exercise sessions.  Che walks and lifts weights at home   Duration Progress to 50 minutes of aerobic without  signs/symptoms of physical distress   Intensity Rest + 30   Progression Continue progressive overload as per policy without signs/symptoms or physical distress.   Resistance Training   Training Prescription Yes   Weight 10   Reps 10-15   Treadmill   MPH 3.6   Grade 6   Minutes 60      Nutrition:  Target Goals: Understanding of nutrition guidelines, daily intake of sodium <1515m, cholesterol <2060m calories 30% from fat and 7% or less from saturated fats, daily to have 5 or more servings of fruits and vegetables.  Biometrics:     Pre Biometrics - 07/08/15 1419    Pre Biometrics   Height 5' 10.4" (1.788 m)   Weight 220 lb 8 oz (100.018 kg)   Waist Circumference 45 inches   Hip Circumference 44 inches   Waist to Hip Ratio 1.02 %   BMI (Calculated) 31.3       Nutrition Therapy Plan and Nutrition Goals:     Nutrition Therapy & Goals - 07/08/15 1458    Nutrition Therapy   Drug/Food Interactions Statins/Certain Fruits   Personal Nutrition Goals   Personal Goal #1 --  HeJihaadaid he would prefer not to meet individually with the Cardiac Rehab Registered dietician. "My wife died 9 year ago and I eat out alot-eat alot of  grilled chicken.    Intervention Plan   Intervention Using nutrition plan and personal goals to gain a healthy nutrition lifestyle. Add exercise as prescribed.      Nutrition Discharge: Rate Your Plate Scores:     Rate Your Plate - 01/07/15 0109    Rate Your Plate Scores   Pre Score 70   Pre Score % 78 %      Nutrition Goals Re-Evaluation:   Psychosocial: Target Goals: Acknowledge presence or absence of depression, maximize coping skills, provide positive support system. Participant is able to verbalize types and ability to use techniques and skills needed for reducing stress and depression.  Initial Review & Psychosocial Screening:     Initial Psych Review & Screening - 07/08/15 1500    Family Dynamics   Good Support System? Yes  Judy's  wife died 55 years ago but his daughter lives nearby.    Barriers   Psychosocial barriers to participate in program There are no identifiable barriers or psychosocial needs.   Screening Interventions   Interventions Encouraged to exercise      Quality of Life Scores:     Quality of Life - 07/08/15 1501    Quality of Life Scores   Health/Function Pre 22.7 %   Socioeconomic Pre 26 %   Psych/Spiritual Pre 24.86 %   Family Pre 24 %   GLOBAL Pre 23.95 %      PHQ-9:     Recent Review Flowsheet Data    Depression screen [facility] 2/9 07/08/2015   Decreased Interest 0   Down, Depressed, Hopeless 0   PHQ - 2 Score 0   Altered sleeping 0   Tired, decreased energy 3   Change in appetite 0   Feeling bad or failure about yourself  0   Trouble concentrating 0   Moving slowly or fidgety/restless 0   Suicidal thoughts 0   PHQ-9 Score 3   Difficult doing work/chores Somewhat difficult      Psychosocial Evaluation and Intervention:     Psychosocial Evaluation - 08/02/15 1010    Psychosocial Evaluation & Interventions   Interventions Encouraged to exercise with the program and follow exercise prescription   Comments Counselor met with Mr. Morrical today for his initial psychosocial evaluation.  He is a 75 year old who had quadruple bypass surgery on 05/10/15.  He has a strong support system with (2) adult daughters who live close by and check on him frequently, as well as active involvement in his local faith community.  Mr. Stiff reports sleeping well and having a good appetite.  He denies a history of depression or anxiety or current symtpoms.  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  Mr. Votta has been walking on the treadmill for years consistently and his goal for this program is to increase his distance and speed while being monitored, so he can return to doing it on his own.   Counselor encouraged Mr. Lichtenwalner to pace himself during his recovery time.     Continued Psychosocial Services Needed Yes  Mr. Lavalley will benefit from the stress management educational component of this program in particular.        Psychosocial Re-Evaluation:   Vocational Rehabilitation: Provide vocational rehab assistance to qualifying candidates.   Vocational Rehab Evaluation & Intervention:     Vocational Rehab - 07/08/15 1455  Initial Vocational Rehab Evaluation & Intervention   Assessment shows need for Vocational Rehabilitation No      Education: Education Goals: Education classes will be provided on a weekly basis, covering required topics. Participant will state understanding/return demonstration of topics presented.  Learning Barriers/Preferences:     Learning Barriers/Preferences - 07/08/15 1454    Learning Barriers/Preferences   Learning Barriers None   Learning Preferences Written Material      Education Topics: General Nutrition Guidelines/Fats and Fiber: -Group instruction provided by verbal, written material, models and posters to present the general guidelines for heart healthy nutrition. Gives an explanation and review of dietary fats and fiber.   Controlling Sodium/Reading Food Labels: -Group verbal and written material supporting the discussion of sodium use in heart healthy nutrition. Review and explanation with models, verbal and written materials for utilization of the food label.   Exercise Physiology & Risk Factors: - Group verbal and written instruction with models to review the exercise physiology of the cardiovascular system and associated critical values. Details cardiovascular disease risk factors and the goals associated with each risk factor.          Cardiac Rehab from 08/04/2015 in [facility] Cardiac and Pulmonary Rehab   Date  07/14/15   Educator  RM   Instruction Review Code  2- meets goals/outcomes      Aerobic Exercise &  Resistance Training: - Gives group verbal and written discussion on the health impact of inactivity. On the components of aerobic and resistive training programs and the benefits of this training and how to safely progress through these programs.      Cardiac Rehab from 08/04/2015 in [facility] Cardiac and Pulmonary Rehab   Date  07/26/15   Educator  RM   Instruction Review Code  2- meets goals/outcomes      Flexibility, Balance, General Exercise Guidelines: - Provides group verbal and written instruction on the benefits of flexibility and balance training programs. Provides general exercise guidelines with specific guidelines to those with heart or lung disease. Demonstration and skill practice provided.      Cardiac Rehab from 08/04/2015 in [facility] Cardiac and Pulmonary Rehab   Date  07/26/15   Educator  RM   Instruction Review Code  2- meets goals/outcomes      Stress Management: - Provides group verbal and written instruction about the health risks of elevated stress, cause of high stress, and healthy ways to reduce stress.      Cardiac Rehab from 08/04/2015 in [facility] Cardiac and Pulmonary Rehab   Date  07/21/15   Educator  [facility]   Instruction Review Code  2- meets goals/outcomes      Depression: - Provides group verbal and written instruction on the correlation between heart/lung disease and depressed mood, treatment options, and the stigmas associated with seeking treatment.   Anatomy & Physiology of the Heart: - Group verbal and written instruction and models provide basic cardiac anatomy and physiology, with the coronary electrical and arterial systems. Review of: AMI, Angina, Valve disease, Heart Failure, Cardiac Arrhythmia, Pacemakers, and the ICD.      Cardiac Rehab from 08/04/2015 in [facility] Cardiac and Pulmonary Rehab   Date  08/02/15   Educator  SB   Instruction Review Code  2- meets goals/outcomes      Cardiac Procedures: - Group verbal and written instruction and models to  describe the testing methods done to diagnose heart disease. Reviews the outcomes of the test results. Describes the treatment choices: Medical Management, Angioplasty,  or Coronary Bypass Surgery.   Cardiac Medications: - Group verbal and written instruction to review commonly prescribed medications for heart disease. Reviews the medication, class of the drug, and side effects. Includes the steps to properly store meds and maintain the prescription regimen.   Go Sex-Intimacy & Heart Disease, Get SMART - Goal Setting: - Group verbal and written instruction through game format to discuss heart disease and the return to sexual intimacy. Provides group verbal and written material to discuss and apply goal setting through the application of the S.M.A.R.T. Method.   Other Matters of the Heart: - Provides group verbal, written materials and models to describe Heart Failure, Angina, Valve Disease, and Diabetes in the realm of heart disease. Includes description of the disease process and treatment options available to the cardiac patient.   Exercise & Equipment Safety: - Individual verbal instruction and demonstration of equipment use and safety with use of the equipment.      Cardiac Rehab from 08/04/2015 in [facility] Cardiac and Pulmonary Rehab   Date  07/08/15   Educator  C. EnterkinRN   Instruction Review Code  1- partially meets, needs review/practice      Infection Prevention: - Provides verbal and written material to individual with discussion of infection control including proper hand washing and proper equipment cleaning during exercise session.      Cardiac Rehab from 08/04/2015 in [facility] Cardiac and Pulmonary Rehab   Date  07/08/15   Educator  C. Alvis Edgell,RN   Instruction Review Code  2- meets goals/outcomes      Falls Prevention: - Provides verbal and written material to individual with discussion of falls prevention and safety.      Cardiac Rehab from 08/04/2015 in [facility] Cardiac and  Pulmonary Rehab   Date  07/08/15   Educator  C. Tunya Held,RN   Instruction Review Code  2- meets goals/outcomes      Diabetes: - Individual verbal and written instruction to review signs/symptoms of diabetes, desired ranges of glucose level fasting, after meals and with exercise. Advice that pre and post exercise glucose checks will be done for 3 sessions at entry of program.    Knowledge Questionnaire Score:     Knowledge Questionnaire Score - 07/08/15 1455    Knowledge Questionnaire Score   Pre Score 24      Personal Goals and Risk Factors at Admission:     Personal Goals and Risk Factors at Admission - 07/08/15 1500    Personal Goals and Risk Factors on Admission   Increase Aerobic Exercise and Physical Activity No;Yes   Intervention While in program, learn and follow the exercise prescription taught. Start at a low level workload and increase workload after able to maintain previous level for 30 minutes. Increase time before increasing intensity.   Diabetes No   Hypertension Yes   Goal Participant will see blood pressure controlled within the values of 140/32m/Hg or within value directed by their physician.   Intervention Provide nutrition & aerobic exercise along with prescribed medications to achieve BP 140/90 or less.   Lipids Yes   Goal Cholesterol controlled with medications as prescribed, with individualized exercise RX and with personalized nutrition plan. Value goals: LDL < 765m HDL > 4029mParticipant states understanding of desired cholesterol values and following prescriptions.   Intervention Provide nutrition & aerobic exercise along with prescribed medications to achieve LDL <41m61mDL >40mg37m   Personal Goals and Risk Factors Review:      Goals and Risk Factor  Review      07/16/15 9030 07/16/15 0941 07/27/15 0725       Increase Aerobic Exercise and Physical Activity   Goals Progress/Improvement seen    Yes     Comments Spoke with Joneen Caraway today to  infoem him that the Ex Phys will work with him on his exercise goals.  Jorian is very experienced with exercise and lives a very active lifestyle. He has a treadmill at home and weights that he uses on days that he doesn't come to class. He has a routine and doesn't seem to want to change it. We will work with him on progressig in his routine or trying new things in order to provide his body with additional stimulus for improvement. We will follow up in February on how this progressing is going.      Hypertension   Goal Participant will see blood pressure controlled within the values of 140/26m/Hg or within value directed by their physician.       Comments Reviewed the factors for controlling his risk factor: meds,exercise and nutrition. Will contnue to follow with RJoneen Carawaythe steps he is completing to keep his risk factor under control. Correction name is not RJoneen Caraway but HLoxley      Abnormal Lipids   Progress seen towards goals Unknown       Comments Reviewed the factors for controlling his risk factor: meds,exercise and nutrition. Will contnue to follow with RJoneen Carawaythe steps he is completing to keep his risk factor under control. Correction name is not RJoneen Caraway but [place]      Stress   Comments Reviewed the factors for controlling his risk factor: meds,exercise and nutrition. Mental health counselor will talk to RJoneen Carawaysoon for further advice. Will contnue to follow with RJoneen Carawaythe steps he is completing to keep his risk factor under control. Correction name is not RJoneen Caraway but HCam[place]         Personal Goals Discharge (Final Personal Goals and Risk Factors Review):      Goals and Risk Factor Review - 07/27/15 0725    Increase Aerobic Exercise and Physical Activity   Goals Progress/Improvement seen  Yes   Comments HSharrieffis very experienced with exercise and lives a very active lifestyle. He has a treadmill at home and weights that he uses on days that he doesn't come to class. He has a  routine and doesn't seem to want to change it. We will work with him on progressig in his routine or trying new things in order to provide his body with additional stimulus for improvement. We will follow up in February on how this progressing is going.       ITP Comments:     ITP Comments      07/11/15 1217 08/05/15 1155         ITP Comments Ready for 30 day review.  Continue with ITP  Has attended orientation, will start sessions first week of Jan. Ready for 30 day review. Continue with ITP.         Comments: HEulalioprefers not to do the resistance training with the handweights but stays on the treadmill instead.

## 2015-08-06 NOTE — Progress Notes (Signed)
Daily Session Note  Patient Details  Name: [name] MRN: [id number] Date of Birth: 05-Jan-1941 Referring Provider:  Minna Merritts, MD  Encounter Date: 08/06/2015  Check In:     Session Check In - 08/06/15 0911    Check-In   Staff Present Heath Lark, RN, BSN, CCRP;Renee Dillard Essex, MS, ACSM CEP, Exercise Physiologist;Huck Ashworth, RN, BSN   ER physicians immediately available to respond to emergencies See telemetry face sheet for immediately available ER MD   Medication changes reported     No   Fall or balance concerns reported    No   Warm-up and Cool-down Performed on first and last piece of equipment   VAD Patient? No   Pain Assessment   Currently in Pain? No/denies         Goals Met:  Proper associated with RPD/PD & O2 Sat Exercise tolerated well No report of cardiac concerns or symptoms  Goals Unmet:  Not Applicable  Goals Comments: [name] prefers not to do the resistance training with the handweights but stays on the treadmill instead.    Dr. Emily Filbert is Medical Director for [facility].

## 2015-08-09 ENCOUNTER — Encounter: Payer: Medicare Other | Admitting: *Deleted

## 2015-08-09 DIAGNOSIS — Z951 Presence of aortocoronary bypass graft: Secondary | ICD-10-CM

## 2015-08-09 NOTE — Progress Notes (Signed)
Daily Session Note  Patient Details  Name: [name] MRN: [id number] Date of Birth: 01/24/1941 Referring Provider:  Minna Merritts, MD  Encounter Date: 08/09/2015  Check In:     Session Check In - 08/09/15 0837    Check-In   Staff Present Candiss Norse, MS, ACSM CEP, Exercise Physiologist;Susanne Bice, RN, BSN, Laveda Norman, BS, ACSM CEP, Exercise Physiologist   ER physicians immediately available to respond to emergencies See telemetry face sheet for immediately available ER MD   Medication changes reported     No   Fall or balance concerns reported    No   Warm-up and Cool-down Performed on first and last piece of equipment   VAD Patient? No   Pain Assessment   Currently in Pain? No/denies   Multiple Pain Sites No         Goals Met:  Independence with exercise equipment Exercise tolerated well No report of cardiac concerns or symptoms Strength training completed today  Goals Unmet:  Not Applicable  Goals Comments: Patient completed exercise prescription and all exercise goals during rehab session. The exercise was tolerated well and the patient is progressing in the program.    Dr. Emily Filbert is Medical Director for [place] and [facility].

## 2015-08-11 ENCOUNTER — Encounter: Payer: Medicare Other | Attending: Cardiovascular Disease

## 2015-08-11 DIAGNOSIS — Z951 Presence of aortocoronary bypass graft: Secondary | ICD-10-CM | POA: Insufficient documentation

## 2015-08-11 NOTE — Addendum Note (Signed)
Addended by: Rudy Jew on: 08/11/2015 07:19 AM   Modules accepted: Orders

## 2015-08-11 NOTE — Progress Notes (Signed)
Daily Session Note  Patient Details  Name: [name] MRN: [id number] Date of Birth: October 04, 1940 Referring Provider:  Minna Merritts, MD  Encounter Date: 08/11/2015  Check In:     Session Check In - 08/11/15 0857    Check-In   Staff Present Heath Lark, RN, BSN, CCRP;Renee Dillard Essex, MS, ACSM CEP, Exercise Physiologist;Lenah Messenger, BS, ACSM EP-C, Exercise Physiologist   ER physicians immediately available to respond to emergencies See telemetry face sheet for immediately available ER MD   Medication changes reported     No   Fall or balance concerns reported    No   Warm-up and Cool-down Performed on first and last piece of equipment   VAD Patient? No   Pain Assessment   Currently in Pain? No/denies         Goals Met:  Proper associated with RPD/PD & O2 Sat Exercise tolerated well No report of cardiac concerns or symptoms Strength training completed today  Goals Unmet:  Not Applicable  Goals Comments:    Dr. Emily Filbert is Medical Director for [place] and [facility].

## 2015-08-13 ENCOUNTER — Encounter: Payer: Medicare Other | Admitting: *Deleted

## 2015-08-13 DIAGNOSIS — Z951 Presence of aortocoronary bypass graft: Secondary | ICD-10-CM

## 2015-08-13 NOTE — Progress Notes (Signed)
Daily Session Note  Patient Details  Name: [name] MRN: [id number] Date of Birth: [birth year] Referring Provider:  Minna Merritts, MD  Encounter Date: 08/13/2015  Check In:     Session Check In - 08/13/15 0915    Check-In   Staff Present Heath Lark, RN, BSN, CCRP;Foxx Klarich, RN, Drusilla Kanner, MS, ACSM CEP, Exercise Physiologist   ER physicians immediately available to respond to emergencies See telemetry face sheet for immediately available ER MD   Medication changes reported     No   Fall or balance concerns reported    No   Warm-up and Cool-down Performed on first and last piece of equipment   VAD Patient? No   Pain Assessment   Currently in Pain? No/denies         Goals Met:  Proper associated with RPD/PD & O2 Sat Exercise tolerated well  Goals Unmet:  Not Applicable  Goals Comments:    Dr. Emily Filbert is Medical Director for [place] and [facility].

## 2015-08-16 ENCOUNTER — Encounter: Payer: Medicare Other | Admitting: *Deleted

## 2015-08-16 DIAGNOSIS — Z951 Presence of aortocoronary bypass graft: Secondary | ICD-10-CM

## 2015-08-16 NOTE — Progress Notes (Signed)
Daily Session Note  Patient Details  Name: [name] MRN: [id number] Date of Birth: 1940-11-13 Referring Provider:  Minna Merritts, MD  Encounter Date: 08/16/2015  Check In:     Session Check In - 08/16/15 0945    Check-In   Staff Present Candiss Norse, MS, ACSM CEP, Exercise Physiologist;Susanne Bice, RN, BSN, Laveda Norman, BS, ACSM CEP, Exercise Physiologist   Supervising physician immediately available to respond to emergencies See telemetry face sheet for immediately available ER MD   Medication changes reported     No   Fall or balance concerns reported    No   Warm-up and Cool-down Performed on first and last piece of equipment   Resistance Training Performed No   VAD Patient? No   Pain Assessment   Currently in Pain? No/denies   Multiple Pain Sites No         Goals Met:  Independence with exercise equipment Exercise tolerated well No report of cardiac concerns or symptoms  Goals Unmet:  Not Applicable  Goals Comments: Patient completed exercise prescription and all exercise goals during rehab session. The exercise was tolerated well and the patient is progressing in the program.    Dr. Emily Filbert is Medical Director for [place] and [facility].

## 2015-08-18 ENCOUNTER — Encounter: Payer: Medicare Other | Admitting: *Deleted

## 2015-08-18 ENCOUNTER — Ambulatory Visit (INDEPENDENT_AMBULATORY_CARE_PROVIDER_SITE_OTHER): Payer: Medicare Other | Admitting: Cardiothoracic Surgery

## 2015-08-18 ENCOUNTER — Encounter: Payer: Self-pay | Admitting: Cardiothoracic Surgery

## 2015-08-18 VITALS — BP 127/80 | HR 63 | Resp 20 | Ht 70.0 in | Wt 220.0 lb

## 2015-08-18 DIAGNOSIS — I2510 Atherosclerotic heart disease of native coronary artery without angina pectoris: Secondary | ICD-10-CM

## 2015-08-18 DIAGNOSIS — Z951 Presence of aortocoronary bypass graft: Secondary | ICD-10-CM

## 2015-08-18 NOTE — Progress Notes (Signed)
Daily Session Note  Patient Details  Name: [name] MRN: [id number] Date of Birth: May 10, 1941 Referring Provider:  Idelle Crouch, MD  Encounter Date: 08/18/2015  Check In:     Session Check In - 08/18/15 0914    Check-In   Location [facility]   Staff Present Gerlene Burdock, RN, Drusilla Kanner, MS, ACSM CEP, Exercise Physiologist;Susanne Bice, RN, BSN, CCRP   Supervising physician immediately available to respond to emergencies See telemetry face sheet for immediately available ER MD   Medication changes reported     No   Fall or balance concerns reported    No   Warm-up and Cool-down Performed on first and last piece of equipment   Resistance Training Performed No   VAD Patient? No   Pain Assessment   Currently in Pain? No/denies   Multiple Pain Sites No         Goals Met:  No Exercise today, education only. NO CHARGE  Goals Unmet:  No exercise today  Goals Comments: [name] did not stay for exercise today due to an appointment. No charge. He only stayed for the education segment.   Dr. Emily Filbert is Medical Director for [place] and [facility].

## 2015-08-18 NOTE — Progress Notes (Signed)
PCP is Williamson,Douglas D, MD Referring Provider is Douglas Iba, MD  Chief Complaint  Patient presents with  . Routine Post Op    2 month f/u    HPI: Patient returns for final postoperative visit almost 3 months after multivessel CABG for angina. He is doing well. He is participating in cardiac rehabilitation at [facility] and is gaining strength and endurance. No recurrent symptoms of angina. Surgical incision is well-healed. Main complaint is that of a dry cough for  which he is being evaluated by his primary care physician Dr. Judithann Williamson. His vital signs during exercise at cardiac rehabilitation are reviewed and appear to be very satisfactory  Past Medical History  Diagnosis Date  . CAD (coronary artery disease)     a. s/p DES to LAD and RCA (2001/2005);  b. 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  Marland Kitchen Hyperlipidemia, mixed   . Essential hypertension   . ED (erectile dysfunction) of non-organic origin   . History of pneumonia   . Kidney stones   . Arthritis   . Leg cramps   . Post-operative Atrial Fibrillation     Past Surgical History  Procedure Laterality Date  . Coronary angioplasty      3 stents  . Back surgery      2 lunbar surgeries  . Cardiac catheterization N/A 04/30/2015    Procedure: Left Heart Cath;  Surgeon: Douglas Iba, MD;  Location: [facility] INVASIVE CV LAB;  Service: Cardiovascular;  Laterality: N/A;  . Retinal detachment surgery Left   . Tee without cardioversion N/A 05/10/2015    Procedure: TRANSESOPHAGEAL ECHOCARDIOGRAM (TEE);  Surgeon: Kerin Perna, MD;  Location: [facility] OR;  Service: Open Heart Surgery;  Laterality: N/A;  . Coronary artery bypass graft N/A 05/10/2015    Procedure: CORONARY ARTERY BYPASS GRAFTING (CABG), ON PUMP, TIMES FOUR, USING LEFT INTERNAL MAMMARY ARTERY, BILATERAL GREATER SAPHENOUS VEINS HARVESTED ENDOSCOPICALLY, WITH CORONARY ENDARTERECTOMY;  Surgeon: Kerin Perna, MD;  Location: [facility] OR;  Service: Open Heart  Surgery;  Laterality: N/A;    Family History  Problem Relation Age of Onset  . Family history unknown: Yes    Social History Social History  Substance Use Topics  . Smoking status: Never Smoker   . Smokeless tobacco: None     Comment: tobacco use- no  . Alcohol Use: No    Current Outpatient Prescriptions  Medication Sig Dispense Refill  . aspirin 81 MG tablet Take 81 mg by mouth daily.      . clopidogrel (PLAVIX) 75 MG tablet TAKE 1 TABLET EVERY DAY 90 tablet 3  . diazepam (VALIUM) 5 MG tablet Take 1 tablet (5 mg total) by mouth every 6 (six) hours as needed for anxiety. 30 tablet 0  . fexofenadine (ALLEGRA) 180 MG tablet Take 180 mg by mouth 2 (two) times a week.    Marland Kitchen lisinopril (PRINIVIL,ZESTRIL) 2.5 MG tablet Take 1 tablet (2.5 mg total) by mouth daily. 30 tablet 3  . metoprolol tartrate (LOPRESSOR) 25 MG tablet Take 0.5 tablets (12.5 mg total) by mouth 2 (two) times daily. 30 tablet 3  . Multiple Vitamins-Minerals (CENTRUM PO) Take by mouth daily.      . Omega-3 Fatty Acids (FISH OIL) 1000 MG CAPS Take 1,000 mg by mouth 2 (two) times daily.    . simvastatin (ZOCOR) 40 MG tablet TAKE 1 TABLET EVERY DAY 90 tablet 3   No current facility-administered medications for this visit.    Allergies  Allergen Reactions  .  Codeine     REACTION: Vomiting    Review of Systems   Alert and comfortable Complaint a dry cough nonproductive No chest pains Clinical swelling No abdominal pain No headache or change in vision No palpitation  BP 127/80 mmHg  Pulse 63  Resp 20  Ht  (1.778 m)  Wt 220 lb (99.791 kg)  BMI 31.57 kg/m2  SpO2 96% Physical Exam        Exam    General- alert and comfortable   Lungs- clear without rales, wheezes   Cor- regular rate and rhythm, no murmur , gallop   Abdomen- soft, non-tender   Extremities - warm, non-tender, minimal edema   Neuro- oriented, appropriate, no focal weakness   Diagnostic Tests: None  Impression: The patient  reached full recovery after CABG He can now lift more than 20 pounds, do yard work, and travel.  Plan:importance of heart healthy lifestyle with  diet and regular activity emphasized to the patient . There is no doubt he will be very compliant with minimizing his cardiac risk factors. He'll return as needed.  [name], MD [facility] [phone]

## 2015-08-20 ENCOUNTER — Encounter: Payer: Medicare Other | Admitting: *Deleted

## 2015-08-20 DIAGNOSIS — Z951 Presence of aortocoronary bypass graft: Secondary | ICD-10-CM | POA: Diagnosis not present

## 2015-08-20 NOTE — Progress Notes (Signed)
Daily Session Note  Patient Details  Name: [name] MRN: [id number] Date of Birth: 10-28-1940 Referring Provider:  Minna Merritts, MD  Encounter Date: 08/20/2015  Check In:     Session Check In - 08/20/15 0904    Check-In   Location [facility]   Staff Present Heath Lark, RN, BSN, CCRP;Abdel Effinger, RN, Drusilla Kanner, MS, ACSM CEP, Exercise Physiologist   Supervising physician immediately available to respond to emergencies See telemetry face sheet for immediately available ER MD   Medication changes reported     No   Warm-up and Cool-down Performed on first and last piece of equipment   VAD Patient? No   Pain Assessment   Currently in Pain? No/denies         Goals Met:  Proper associated with RPD/PD & O2 Sat Exercise tolerated well  Goals Unmet:  Not Applicable  Goals Comments: [name] likes to work on the Treadmill on incline 5 and does appox 45 minutes+ each time he is in Cardiac Rehab.   Dr. Emily Filbert is Medical Director for [place] and [facility].

## 2015-08-23 ENCOUNTER — Encounter: Payer: Medicare Other | Admitting: *Deleted

## 2015-08-23 DIAGNOSIS — Z951 Presence of aortocoronary bypass graft: Secondary | ICD-10-CM

## 2015-08-23 NOTE — Progress Notes (Signed)
Daily Session Note  Patient Details  Name: [name] MRN: [id number] Date of Birth: [birth year] Referring Provider:  Minna Merritts, MD  Encounter Date: 08/23/2015  Check In:     Session Check In - 08/23/15 0845    Check-In   Location [facility]   Staff Present Candiss Norse, MS, ACSM CEP, Exercise Physiologist;Susanne Bice, RN, BSN, Laveda Norman, BS, ACSM CEP, Exercise Physiologist   Supervising physician immediately available to respond to emergencies See telemetry face sheet for immediately available ER MD   Medication changes reported     No   Fall or balance concerns reported    No   Warm-up and Cool-down Performed on first and last piece of equipment   Resistance Training Performed No   VAD Patient? No   Pain Assessment   Currently in Pain? No/denies   Multiple Pain Sites No         Goals Met:  Independence with exercise equipment Exercise tolerated well No report of cardiac concerns or symptoms  Goals Unmet:  Not Applicable  Goals Comments: Patient completed exercise prescription and all exercise goals during rehab session. The exercise was tolerated well and the patient is progressing in the program.    Dr. Emily Filbert is Medical Director for [place] and [facility].

## 2015-08-25 ENCOUNTER — Encounter: Payer: Medicare Other | Admitting: *Deleted

## 2015-08-25 DIAGNOSIS — Z951 Presence of aortocoronary bypass graft: Secondary | ICD-10-CM

## 2015-08-25 NOTE — Progress Notes (Signed)
Daily Session Note  Patient Details  Name: [name] MRN: [id number] Date of Birth: 01/29/1941 Referring Provider:  Gollan, Timothy J, MD  Encounter Date: 08/25/2015  Check In:     Session Check In - 08/25/15 0907    Check-In   Location [facility]   Staff Present Renee MacMillan, MS, ACSM CEP, Exercise Physiologist;Laureen Brown, BS, RRT, Respiratory Therapist;Susanne Bice, RN, BSN, CCRP   Supervising physician immediately available to respond to emergencies See telemetry face sheet for immediately available ER MD   Medication changes reported     No   Fall or balance concerns reported    No   Warm-up and Cool-down Performed on first and last piece of equipment   Resistance Training Performed No   VAD Patient? No   Pain Assessment   Currently in Pain? No/denies   Multiple Pain Sites No         Goals Met:  Independence with exercise equipment Exercise tolerated well No report of cardiac concerns or symptoms Strength training completed today  Goals Unmet:  Not Applicable  Goals Comments: Patient completed exercise prescription and all exercise goals during rehab session. The exercise was tolerated well and the patient is progressing in the program.    Dr. Mark Miller is Medical Director for [facility] and [facility]. 

## 2015-08-27 ENCOUNTER — Encounter: Payer: Medicare Other | Admitting: *Deleted

## 2015-08-27 DIAGNOSIS — Z951 Presence of aortocoronary bypass graft: Secondary | ICD-10-CM | POA: Diagnosis not present

## 2015-08-27 NOTE — Progress Notes (Signed)
Daily Session Note  Patient Details  Name: [name] MRN: [id number] Date of Birth: 06/03/1941 Referring Provider:  Gollan, Timothy J, MD  Encounter Date: 08/27/2015  Check In:     Session Check In - 08/27/15 0853    Check-In   Location [facility]   Staff Present Carroll Enterkin, RN, BSN;Susanne Bice, RN, BSN, CCRP;Renee MacMillan, MS, ACSM CEP, Exercise Physiologist   Supervising physician immediately available to respond to emergencies See telemetry face sheet for immediately available ER MD   Medication changes reported     No   Fall or balance concerns reported    No   Warm-up and Cool-down Performed on first and last piece of equipment   Resistance Training Performed Yes   VAD Patient? No   Pain Assessment   Currently in Pain? Yes         Goals Met:  Proper associated with RPD/PD & O2 Sat Exercise tolerated well  Goals Unmet:  Not Applicable  Goals Comments:    Dr. Mark Miller is Medical Director for [facility] and [facility]. 

## 2015-08-30 ENCOUNTER — Encounter: Payer: Medicare Other | Admitting: *Deleted

## 2015-08-30 DIAGNOSIS — Z951 Presence of aortocoronary bypass graft: Secondary | ICD-10-CM

## 2015-08-30 NOTE — Progress Notes (Signed)
Daily Session Note  Patient Details  Name: [name] MRN: [id number] Date of Birth: [birth year] Referring Provider:  Idelle Crouch, MD  Encounter Date: 08/30/2015  Check In:     Session Check In - 08/30/15 0826    Check-In   Location [facility]   Staff Present Candiss Norse, MS, ACSM CEP, Exercise Physiologist;Susanne Bice, RN, BSN, Laveda Norman, BS, ACSM CEP, Exercise Physiologist   Supervising physician immediately available to respond to emergencies See telemetry face sheet for immediately available ER MD   Medication changes reported     No   Fall or balance concerns reported    No   Warm-up and Cool-down Performed on first and last piece of equipment   Resistance Training Performed No   VAD Patient? No   Pain Assessment   Currently in Pain? No/denies   Multiple Pain Sites No         Goals Met:  Independence with exercise equipment Exercise tolerated well No report of cardiac concerns or symptoms Strength training completed today  Goals Unmet:  Not Applicable  Goals Comments: Patient completed exercise prescription and all exercise goals during rehab session. The exercise was tolerated well and the patient is progressing in the program. [name] does not participate in the resistive training and gets 60 minutes of aerobic exercise instead.   Dr. Emily Filbert is Medical Director for [place] and [facility].

## 2015-08-31 NOTE — Progress Notes (Signed)
Cardiac Individual Treatment Plan  Patient Details  Name: [name] MRN: [id number] Date of Birth: 1940/07/15 Referring Provider:  Minna Merritts, MD  Initial Encounter Date:    Visit Diagnosis: S/P CABG x 4  Patient's Home Medications on Admission:  Current outpatient prescriptions:  .  aspirin 81 MG tablet, Take 81 mg by mouth daily.  , Disp: , Rfl:  .  clopidogrel (PLAVIX) 75 MG tablet, TAKE 1 TABLET EVERY DAY, Disp: 90 tablet, Rfl: 3 .  diazepam (VALIUM) 5 MG tablet, Take 1 tablet (5 mg total) by mouth every 6 (six) hours as needed for anxiety., Disp: 30 tablet, Rfl: 0 .  fexofenadine (ALLEGRA) 180 MG tablet, Take 180 mg by mouth 2 (two) times a week., Disp: , Rfl:  .  lisinopril (PRINIVIL,ZESTRIL) 2.5 MG tablet, Take 1 tablet (2.5 mg total) by mouth daily., Disp: 30 tablet, Rfl: 3 .  metoprolol tartrate (LOPRESSOR) 25 MG tablet, Take 0.5 tablets (12.5 mg total) by mouth 2 (two) times daily., Disp: 30 tablet, Rfl: 3 .  Multiple Vitamins-Minerals (CENTRUM PO), Take by mouth daily.  , Disp: , Rfl:  .  Omega-3 Fatty Acids (FISH OIL) 1000 MG CAPS, Take 1,000 mg by mouth 2 (two) times daily., Disp: , Rfl:  .  simvastatin (ZOCOR) 40 MG tablet, TAKE 1 TABLET EVERY DAY, Disp: 90 tablet, Rfl: 3  Past Medical History: Past Medical History  Diagnosis Date  . CAD (coronary artery disease)     a. s/p DES to LAD and RCA (2001/2005);  b. 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  Marland Kitchen Hyperlipidemia, mixed   . Essential hypertension   . ED (erectile dysfunction) of non-organic origin   . History of pneumonia   . Kidney stones   . Arthritis   . Leg cramps   . Post-operative Atrial Fibrillation     Tobacco Use: History  Smoking status  . Never Smoker   Smokeless tobacco  . Not on file    Comment: tobacco use- no    Labs: Recent Review Flowsheet Data    Labs for ITP Cardiac and Pulmonary Rehab Latest Ref Rng 05/10/2015 05/10/2015 05/10/2015 05/11/2015 05/11/2015    PHART 7.350 - 7.450 7.380 7.340(L) - 7.388 -   PCO2ART 35.0 - 45.0 mmHg 42.0 44.9 - 39.4 -   HCO3 20.0 - 24.0 mEq/L 24.7(H) 24.0 - 23.6 -   TCO2 0 - 100 mmol/L _0 ACIDBASEDEF 0.0 - 2.0 mmol/L - 2.0 - 1.0 -   O2SAT - 97.0 95.0 - 91.0 -       Exercise Target Goals:    Exercise Program Goal: Individual exercise prescription set with THRR, safety & activity barriers. Participant demonstrates ability to understand and report RPE using BORG scale, to self-measure pulse accurately, and to acknowledge the importance of the exercise prescription.  Exercise Prescription Goal: Starting with aerobic activity 30 plus minutes a day, 3 days per week for initial exercise prescription. Provide home exercise prescription and guidelines that participant acknowledges understanding prior to discharge.  Activity Barriers & Risk Stratification:     Activity Barriers & Cardiac Risk Stratification - 07/08/15 1453    Activity Barriers & Cardiac Risk Stratification   Activity Barriers Other (comment)  Back problems      6 Minute Walk:     6 Minute Walk      07/08/15 1416       6 Minute Walk   Phase Initial  Distance 1090 feet     Walk Time 6 minutes     RPE 12     Perceived Dyspnea  2     Symptoms No     Resting HR 80 bpm     Resting BP 128/80 mmHg     Max Ex. HR 102 bpm     Max Ex. BP 128/78 mmHg        Initial Exercise Prescription:     Initial Exercise Prescription - 07/08/15 1400    Date of Initial Exercise Prescription   Date 07/08/15   Treadmill   MPH 1.8   Grade 0   Minutes 10   Recumbant Bike   Level 2   RPM 40   Watts 20   Minutes 10   NuStep   Level 2   Watts 50   Minutes 10   Arm Ergometer   Level 1   Watts 10   Minutes 10   Recumbant Elliptical   Level 2   RPM 40   Watts 20   Minutes 10   REL-XR   Level 3   Watts 50   Minutes 10   T5 Nustep   Level 1   Watts 20   Minutes 10   Biostep-RELP   Level 2   Watts 45   Minutes 10    Prescription Details   Frequency (times per week) 3   Duration Progress to 30 minutes of continuous aerobic without signs/symptoms of physical distress   Intensity   THRR REST +  30   Ratings of Perceived Exertion 11-15   Perceived Dyspnea 2-4   Progression Continue progressive overload as per policy without signs/symptoms or physical distress.   Resistance Training   Training Prescription Yes   Weight 2   Reps 10-15      Exercise Prescription Changes:     Exercise Prescription Changes      07/16/15 1100 07/26/15 0723 08/04/15 0800 08/18/15 0900 08/20/15 0900   Exercise Review   Progression Yes Yes Yes     Response to Exercise   Blood Pressure (Admit)  150/90 mmHg      Blood Pressure (Exercise)  144/84 mmHg      Blood Pressure (Exit)  116/64 mmHg      Heart Rate (Admit)  75 bpm      Heart Rate (Exercise)  101 bpm      Heart Rate (Exit)  84 bpm      Rating of Perceived Exertion (Exercise)  11      Symptoms _0    Comments Today was [name]'s second day of exercise.Reviewed orientation to the gym and the equipment functions and settings. Procedures and policies of the gym were outlined and explained. The patient's individual exercise prescription and treatment plan were reviewed with them. All starting workloads were established based on the results of the functional testing  done at the initial intake visit. The plan for exercise progression was also introduced and progression will be customized based on the patient's performance and goals. [name] has a long history of being physically active and can exercise safely for the full class time. He is also exercising at home and this has been approved by his MD. He mostly walks at home on the treadmill. He does not use heavy weights at home at this time due to MD's orders. He is allowed to lift anything under 15 lbs. We will continually progress him in the program using intensity.  Reviewed  individualized exercise  prescription and made increases per departmental policy. Exercise increases were discussed with the patient and they were able to perform the new work loads without issue (no signs or symptoms).   Education only today, no exercise. No charge. Education only today, no exercise. No charge.   Frequency Add 1 additional day to program exercise sessions. Add 3 additional days to program exercise sessions.  Damieon walks and lifts weights at home Add 3 additional days to program exercise sessions.  Kenly walks and lifts weights at home Add 3 additional days to program exercise sessions.  Savaughn walks and lifts weights at home Add 3 additional days to program exercise sessions.  Simpson walks and lifts weights at home   Duration Progress to 50 minutes of aerobic without signs/symptoms of physical distress Progress to 50 minutes of aerobic without signs/symptoms of physical distress Progress to 50 minutes of aerobic without signs/symptoms of physical distress Progress to 50 minutes of aerobic without signs/symptoms of physical distress Progress to 50 minutes of aerobic without signs/symptoms of physical distress   Intensity Rest + 30 Rest + 30 Rest + 30 Rest + 30 Rest + 30   Progression Continue progressive overload as per policy without signs/symptoms or physical distress. Continue progressive overload as per policy without signs/symptoms or physical distress. Continue progressive overload as per policy without signs/symptoms or physical distress. Continue progressive overload as per policy without signs/symptoms or physical distress. Continue progressive overload as per policy without signs/symptoms or physical distress.   Resistance Training   Training Prescription _0    Weight _1 Reps 10-15 10-15 10-15 10-15 10-15   Treadmill   MPH 3.6 3.6 3.6 3.6 3.6   Grade _2 Minutes 45 45 60 60 60     08/23/15 1400           Exercise Review   Progression Yes        Response to Exercise   Blood Pressure (Admit) 122/74 mmHg       Blood Pressure (Exercise) 142/70 mmHg       Blood Pressure (Exit) 124/70 mmHg       Heart Rate (Admit) 76 bpm       Heart Rate (Exercise) 104 bpm       Heart Rate (Exit) 96 bpm       Rating of Perceived Exertion (Exercise) 12       Symptoms None       Comments Mana is very adamant about maintaining the same exercise routine in class. He is non compliant in trying anything different. With the warmer weather he has increaed his walking outside and has therefore progressed in time with his home exercise routine.        Frequency Add 3 additional days to program exercise sessions.  Maintains his home exercise routine       Duration Progress to 50 minutes of aerobic without signs/symptoms of physical distress       Intensity Rest + 30       Progression Continue progressive overload as per policy without signs/symptoms or physical distress.       Resistance Training   Training Prescription Yes       Weight 10       Reps 10-15       Treadmill   MPH 3.6       Grade 6       Minutes 60  Discharge Exercise Prescription (Final Exercise Prescription Changes):     Exercise Prescription Changes - 08/23/15 1400    Exercise Review   Progression Yes   Response to Exercise   Blood Pressure (Admit) 122/74 mmHg   Blood Pressure (Exercise) 142/70 mmHg   Blood Pressure (Exit) 124/70 mmHg   Heart Rate (Admit) 76 bpm   Heart Rate (Exercise) 104 bpm   Heart Rate (Exit) 96 bpm   Rating of Perceived Exertion (Exercise) 12   Symptoms None   Comments Manuelito is very adamant about maintaining the same exercise routine in class. He is non compliant in trying anything different. With the warmer weather he has increaed his walking outside and has therefore progressed in time with his home exercise routine.    Frequency Add 3 additional days to program exercise sessions.  Maintains his home exercise routine   Duration Progress to 50  minutes of aerobic without signs/symptoms of physical distress   Intensity Rest + 30   Progression Continue progressive overload as per policy without signs/symptoms or physical distress.   Resistance Training   Training Prescription Yes   Weight 10   Reps 10-15   Treadmill   MPH 3.6   Grade 6   Minutes 60      Nutrition:  Target Goals: Understanding of nutrition guidelines, daily intake of sodium <1525m, cholesterol <2048m calories 30% from fat and 7% or less from saturated fats, daily to have 5 or more servings of fruits and vegetables.  Biometrics:     Pre Biometrics - 07/08/15 1419    Pre Biometrics   Height 5' 10.4" (1.788 m)   Weight 220 lb 8 oz (100.018 kg)   Waist Circumference 45 inches   Hip Circumference 44 inches   Waist to Hip Ratio 1.02 %   BMI (Calculated) 31.3       Nutrition Therapy Plan and Nutrition Goals:     Nutrition Therapy & Goals - 07/08/15 1458    Nutrition Therapy   Drug/Food Interactions Statins/Certain Fruits   Personal Nutrition Goals   Personal Goal #1 --  HeMackyaid he would prefer not to meet individually with the Cardiac Rehab Registered dietician. "My wife died 9 year ago and I eat out alot-eat alot of grilled chicken.    Intervention Plan   Intervention Using nutrition plan and personal goals to gain a healthy nutrition lifestyle. Add exercise as prescribed.      Nutrition Discharge: Rate Your Plate Scores:     Rate Your Plate - 0189/[id number]/1061751  Rate Your Plate Scores   Pre Score 70   Pre Score % 78 %      Nutrition Goals Re-Evaluation:   Psychosocial: Target Goals: Acknowledge presence or absence of depression, maximize coping skills, provide positive support system. Participant is able to verbalize types and ability to use techniques and skills needed for reducing stress and depression.  Initial Review & Psychosocial Screening:     Initial Psych Review & Screening - 07/08/15 1500    Family Dynamics   Good  Support System? Yes  Yan's wife died 9 86ears ago but his daughter lives nearby.    Barriers   Psychosocial barriers to participate in program There are no identifiable barriers or psychosocial needs.   Screening Interventions   Interventions Encouraged to exercise      Quality of Life Scores:     Quality of Life - 07/08/15 1501    Quality of Life Scores   Health/Function Pre  22.7 %   Socioeconomic Pre 26 %   Psych/Spiritual Pre 24.86 %   Family Pre 24 %   GLOBAL Pre 23.95 %      PHQ-9:     Recent Review Flowsheet Data    Depression screen [facility] 2/9 07/08/2015   Decreased Interest 0   Down, Depressed, Hopeless 0   PHQ - 2 Score 0   Altered sleeping 0   Tired, decreased energy 3   Change in appetite 0   Feeling bad or failure about yourself  0   Trouble concentrating 0   Moving slowly or fidgety/restless 0   Suicidal thoughts 0   PHQ-9 Score 3   Difficult doing work/chores Somewhat difficult      Psychosocial Evaluation and Intervention:     Psychosocial Evaluation - 08/02/15 1010    Psychosocial Evaluation & Interventions   Interventions Encouraged to exercise with the program and follow exercise prescription   Comments Counselor met with Mr. Kinzler today for his initial psychosocial evaluation.  He is a 75 year old who had quadruple bypass surgery on 05/10/15.  He has a strong support system with (2) adult daughters who live close by and check on him frequently, as well as active involvement in his local faith community.  Mr. Vivier reports sleeping well and having a good appetite.  He denies a history of depression or anxiety or current symtpoms.  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  Mr. Barrell has been walking on the treadmill for years consistently and his goal for this program is to increase his distance and speed while being monitored, so he can  return to doing it on his own.  Counselor encouraged Mr. Borquez to pace himself during his recovery time.     Continued Psychosocial Services Needed Yes  Mr. Kruer will benefit from the stress management educational component of this program in particular.        Psychosocial Re-Evaluation:     Psychosocial Re-Evaluation      08/20/15 0904           Psychosocial Re-Evaluation   Comments Simuel said he is going to work this weekend. 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           Vocational Rehabilitation: Provide vocational rehab assistance to qualifying candidates.   Vocational Rehab Evaluation & Intervention:     Vocational Rehab - 07/08/15 1455    Initial Vocational Rehab Evaluation & Intervention   Assessment shows need for Vocational Rehabilitation No      Education: Education Goals: Education classes will be provided on a weekly basis, covering required topics. Participant will state understanding/return demonstration of topics presented.  Learning Barriers/Preferences:     Learning Barriers/Preferences - 07/08/15 1454    Learning Barriers/Preferences   Learning Barriers None   Learning Preferences Written Material      Education Topics: General Nutrition Guidelines/Fats and Fiber: -Group instruction provided by verbal, written material, models and posters to present the general guidelines for heart healthy nutrition. Gives an explanation and review of dietary fats and fiber.   Controlling Sodium/Reading Food Labels: -Group verbal and written material supporting the discussion of sodium use in heart healthy nutrition. Review and explanation with models,  verbal and written materials for utilization of the food label.          Cardiac Rehab from 08/30/2015 in [facility] Cardiac and Pulmonary Rehab   Date  08/23/15   Educator  CR   Instruction Review Code  2- meets  goals/outcomes      Exercise Physiology & Risk Factors: - Group verbal and written instruction with models to review the exercise physiology of the cardiovascular system and associated critical values. Details cardiovascular disease risk factors and the goals associated with each risk factor.      Cardiac Rehab from 08/30/2015 in [facility] Cardiac and Pulmonary Rehab   Date  07/14/15   Educator  RM   Instruction Review Code  2- meets goals/outcomes      Aerobic Exercise & Resistance Training: - Gives group verbal and written discussion on the health impact of inactivity. On the components of aerobic and resistive training programs and the benefits of this training and how to safely progress through these programs.      Cardiac Rehab from 08/30/2015 in [facility] Cardiac and Pulmonary Rehab   Date  07/26/15   Educator  RM   Instruction Review Code  2- meets goals/outcomes      Flexibility, Balance, General Exercise Guidelines: - Provides group verbal and written instruction on the benefits of flexibility and balance training programs. Provides general exercise guidelines with specific guidelines to those with heart or lung disease. Demonstration and skill practice provided.      Cardiac Rehab from 08/30/2015 in [facility] Cardiac and Pulmonary Rehab   Date  07/26/15   Educator  RM   Instruction Review Code  2- meets goals/outcomes      Stress Management: - Provides group verbal and written instruction about the health risks of elevated stress, cause of high stress, and healthy ways to reduce stress.      Cardiac Rehab from 08/30/2015 in [facility] Cardiac and Pulmonary Rehab   Date  07/21/15   Educator  [facility]   Instruction Review Code  2- meets goals/outcomes      Depression: - Provides group verbal and written instruction on the correlation between heart/lung disease and depressed mood, treatment options, and the stigmas associated with seeking treatment.   Anatomy & Physiology of the Heart: - Group  verbal and written instruction and models provide basic cardiac anatomy and physiology, with the coronary electrical and arterial systems. Review of: AMI, Angina, Valve disease, Heart Failure, Cardiac Arrhythmia, Pacemakers, and the ICD.      Cardiac Rehab from 08/30/2015 in [facility] Cardiac and Pulmonary Rehab   Date  08/02/15   Educator  SB   Instruction Review Code  2- meets goals/outcomes      Cardiac Procedures: - Group verbal and written instruction and models to describe the testing methods done to diagnose heart disease. Reviews the outcomes of the test results. Describes the treatment choices: Medical Management, Angioplasty, or Coronary Bypass Surgery.   Cardiac Medications: - Group verbal and written instruction to review commonly prescribed medications for heart disease. Reviews the medication, class of the drug, and side effects. Includes the steps to properly store meds and maintain the prescription regimen.      Cardiac Rehab from 08/30/2015 in [facility] Cardiac and Pulmonary Rehab   Date  08/18/15   Educator  SB   Instruction Review Code  2- meets goals/outcomes      Go Sex-Intimacy & Heart Disease, Get SMART - Goal Setting: - Group verbal and written instruction through game  format to discuss heart disease and the return to sexual intimacy. Provides group verbal and written material to discuss and apply goal setting through the application of the S.M.A.R.T. Method.   Other Matters of the Heart: - Provides group verbal, written materials and models to describe Heart Failure, Angina, Valve Disease, and Diabetes in the realm of heart disease. Includes description of the disease process and treatment options available to the cardiac patient.      Cardiac Rehab from 08/30/2015 in [facility] Cardiac and Pulmonary Rehab   Date  08/09/15   Educator  SB   Instruction Review Code  2- meets goals/outcomes      Exercise & Equipment Safety: - Individual verbal instruction and demonstration of  equipment use and safety with use of the equipment.      Cardiac Rehab from 08/30/2015 in [facility] Cardiac and Pulmonary Rehab   Date  07/08/15   Educator  C. EnterkinRN   Instruction Review Code  1- partially meets, needs review/practice      Infection Prevention: - Provides verbal and written material to individual with discussion of infection control including proper hand washing and proper equipment cleaning during exercise session.      Cardiac Rehab from 08/30/2015 in [facility] Cardiac and Pulmonary Rehab   Date  07/08/15   Educator  C. Enterkin,RN   Instruction Review Code  2- meets goals/outcomes      Falls Prevention: - Provides verbal and written material to individual with discussion of falls prevention and safety.      Cardiac Rehab from 08/30/2015 in [facility] Cardiac and Pulmonary Rehab   Date  07/08/15   Educator  C. Enterkin,RN   Instruction Review Code  2- meets goals/outcomes      Diabetes: - Individual verbal and written instruction to review signs/symptoms of diabetes, desired ranges of glucose level fasting, after meals and with exercise. Advice that pre and post exercise glucose checks will be done for 3 sessions at entry of program.    Knowledge Questionnaire Score:     Knowledge Questionnaire Score - 07/08/15 1455    Knowledge Questionnaire Score   Pre Score 24      Personal Goals and Risk Factors at Admission:     Personal Goals and Risk Factors at Admission - 07/08/15 1500    Personal Goals and Risk Factors on Admission   Increase Aerobic Exercise and Physical Activity No;Yes   Intervention While in program, learn and follow the exercise prescription taught. Start at a low level workload and increase workload after able to maintain previous level for 30 minutes. Increase time before increasing intensity.   Diabetes No   Hypertension Yes   Goal Participant will see blood pressure controlled within the values of 140/27m/Hg or within value directed by their  physician.   Intervention Provide nutrition & aerobic exercise along with prescribed medications to achieve BP 140/90 or less.   Lipids Yes   Goal Cholesterol controlled with medications as prescribed, with individualized exercise RX and with personalized nutrition plan. Value goals: LDL < 761m HDL > 4067mParticipant states understanding of desired cholesterol values and following prescriptions.   Intervention Provide nutrition & aerobic exercise along with prescribed medications to achieve LDL <79m61mDL >40mg31m   Personal Goals and Risk Factors Review:      Goals and Risk Factor Review      07/16/15 0938 07/16/15 0941 07/27/15 0725 08/23/15 1454     Increase Aerobic Exercise and Physical Activity  Goals Progress/Improvement seen    Yes Yes    Comments Spoke with Joneen Caraway today to infoem him that the Ex Phys will work with him on his exercise goals.  Almir is very experienced with exercise and lives a very active lifestyle. He has a treadmill at home and weights that he uses on days that he doesn't come to class. He has a routine and doesn't seem to want to change it. We will work with him on progressig in his routine or trying new things in order to provide his body with additional stimulus for improvement. We will follow up in February on how this progressing is going.  Gardy is non compliant in switching his exercise routine or making any changes to it. It is the same thing he has done for years and he says that he does not want to try anything new. In an effort to encourage him where he is currently at , we are not going to revisit any exercise changes and continue to promote daily exercise into his routine which he has been good at doing. With the warmer weather he has been doing more outside and walking and this  reflects a progression in exercise time. Reagen's main goal is to finish the program so he can continue working and get back to his routine at home.     Hypertension   Goal  Participant will see blood pressure controlled within the values of 140/31m/Hg or within value directed by their physician.       Comments Reviewed the factors for controlling his risk factor: meds,exercise and nutrition. Will contnue to follow with RJoneen Carawaythe steps he is completing to keep his risk factor under control. Correction name is not [place]oneen Caraway but [place]      Abnormal Lipids   Progress seen towards goals Unknown       Comments Reviewed the factors for controlling his risk factor: meds,exercise and nutrition. Will contnue to follow with RJoneen Carawaythe steps he is completing to keep his risk factor under control. Correction name is not RJoneen Caraway but [place]      Stress   Comments Reviewed the factors for controlling his risk factor: meds,exercise and nutrition. Mental health counselor will talk to RJoneen Carawaysoon for further advice. Will contnue to follow with RJoneen Carawaythe steps he is completing to keep his risk factor under control. Correction name is not RJoneen Caraway but HKilmichael         Personal Goals Discharge (Final Personal Goals and Risk Factors Review):      Goals and Risk Factor Review - 08/23/15 1454    Increase Aerobic Exercise and Physical Activity   Goals Progress/Improvement seen  Yes   Comments HMitchealis non compliant in switching his exercise routine or making any changes to it. It is the same thing he has done for years and he says that he does not want to try anything new. In an effort to encourage him where he is currently at , we are not going to revisit any exercise changes and continue to promote daily exercise into his routine which he has been good at doing. With the warmer weather he has been doing more outside and walking and this  reflects a progression in exercise time. Edinson's main goal is to finish the program so he can continue working and get back to his routine at home.       ITP Comments:     ITP Comments      07/11/15 1217 08/05/15  1155 08/27/15 0854 08/31/15  0748     ITP Comments Ready for 30 day review.  Continue with ITP  Has attended orientation, will start sessions first week of Jan. Ready for 30 day review. Continue with ITP. Cormick likes to exercise on the treadmill.  30 day review.  Continue with ITP       Comments:

## 2015-09-01 DIAGNOSIS — Z951 Presence of aortocoronary bypass graft: Secondary | ICD-10-CM

## 2015-09-01 NOTE — Progress Notes (Signed)
Daily Session Note  Patient Details  Name: [name] MRN: [id number] Date of Birth: 1940/08/21 Referring Provider:  Minna Merritts, MD  Encounter Date: 09/01/2015  Check In:     Session Check In - 09/01/15 0825    Check-In   Location [facility]   Staff Present Heath Lark, RN, BSN, CCRP;Renee Dillard Essex, MS, ACSM CEP, Exercise Physiologist;Roisin Mones, BS, ACSM EP-C, Exercise Physiologist   Supervising physician immediately available to respond to emergencies See telemetry face sheet for immediately available ER MD   Medication changes reported     No   Fall or balance concerns reported    No   Warm-up and Cool-down Performed on first and last piece of equipment   Resistance Training Performed No   VAD Patient? No   Pain Assessment   Currently in Pain? No/denies         Goals Met:  Proper associated with RPD/PD & O2 Sat Exercise tolerated well No report of cardiac concerns or symptoms Strength training completed today  Goals Unmet:  Not Applicable  Goals Comments:    Dr. Emily Filbert is Medical Director for [place] and [facility].

## 2015-09-03 ENCOUNTER — Encounter: Payer: Medicare Other | Admitting: *Deleted

## 2015-09-03 DIAGNOSIS — Z951 Presence of aortocoronary bypass graft: Secondary | ICD-10-CM | POA: Diagnosis not present

## 2015-09-03 NOTE — Progress Notes (Signed)
Daily Session Note  Patient Details  Name: [name] MRN: [id number] Date of Birth: Dec 07, 1940 Referring Provider:  Minna Merritts, MD  Encounter Date: 09/03/2015  Check In:     Session Check In - 09/03/15 0916    Check-In   Staff Present Heath Lark, RN, BSN, CCRP;Renee Dillard Essex, MS, ACSM CEP, Exercise Physiologist;Carroll Enterkin, RN, BSN   Supervising physician immediately available to respond to emergencies See telemetry face sheet for immediately available ER MD   Medication changes reported     No   Fall or balance concerns reported    No   Warm-up and Cool-down Performed on first and last piece of equipment   Resistance Training Performed No   VAD Patient? No   Pain Assessment   Currently in Pain? No/denies           Exercise Prescription Changes - 09/03/15 0900    Exercise Review   Progression Yes   Response to Exercise   Frequency --  Maintains his home exercise routine   Resistance Training   Training Prescription Yes   Weight 10   Reps 10-15   Treadmill   MPH 3.6   Grade 6   Minutes 60      Goals Met:  Independence with exercise equipment Exercise tolerated well No report of cardiac concerns or symptoms Strength training completed today  Goals Unmet:  Not Applicable  Goals Comments: Doing well with exercise prescription progression.    Dr. Emily Filbert is Medical Director for [place] and [facility].

## 2015-09-06 ENCOUNTER — Encounter: Payer: Medicare Other | Admitting: *Deleted

## 2015-09-06 DIAGNOSIS — Z951 Presence of aortocoronary bypass graft: Secondary | ICD-10-CM | POA: Diagnosis not present

## 2015-09-06 NOTE — Progress Notes (Signed)
Daily Session Note  Patient Details  Name: [name] MRN: [id number] Date of Birth: [birth year] Referring Provider:  Minna Merritts, MD  Encounter Date: 09/06/2015  Check In:     Session Check In - 09/06/15 0855    Check-In   Location [facility]   Staff Present Candiss Norse, MS, ACSM CEP, Exercise Physiologist;Jaron Czarnecki Amedeo Plenty, BS, ACSM CEP, Exercise Physiologist;Carroll Enterkin, RN, BSN   Supervising physician immediately available to respond to emergencies See telemetry face sheet for immediately available ER MD   Medication changes reported     No   Fall or balance concerns reported    No   Warm-up and Cool-down Performed on first and last piece of equipment   Resistance Training Performed Yes   VAD Patient? No   Pain Assessment   Currently in Pain? No/denies   Multiple Pain Sites No         Goals Met:  Independence with exercise equipment Exercise tolerated well No report of cardiac concerns or symptoms Strength training completed today  Goals Unmet:  Not Applicable  Goals Comments: Patient completed exercise prescription and all exercise goals during rehab session. The exercise was tolerated well and the patient is progressing in the program.     Dr. Emily Filbert is Medical Director for [place] and [facility].

## 2015-09-07 ENCOUNTER — Other Ambulatory Visit: Payer: Self-pay | Admitting: Physician Assistant

## 2015-09-07 ENCOUNTER — Other Ambulatory Visit: Payer: Self-pay | Admitting: Cardiovascular Disease

## 2015-09-08 ENCOUNTER — Encounter: Payer: Medicare Other | Attending: Cardiovascular Disease | Admitting: *Deleted

## 2015-09-08 DIAGNOSIS — Z951 Presence of aortocoronary bypass graft: Secondary | ICD-10-CM | POA: Insufficient documentation

## 2015-09-08 NOTE — Progress Notes (Signed)
Daily Session Note  Patient Details  Name: [name] MRN: [id number] Date of Birth: 03/27/1941 Referring Provider:  Minna Merritts, MD  Encounter Date: 09/08/2015  Check In:     Session Check In - 09/08/15 0848    Check-In   Staff Present Nyoka Cowden, RN;Maude Hettich, RN, BSN, CCRP;Steven Way, BS, ACSM EP-C, Exercise Physiologist   Supervising physician immediately available to respond to emergencies See telemetry face sheet for immediately available ER MD   Medication changes reported     No   Fall or balance concerns reported    No   Warm-up and Cool-down Performed on first and last piece of equipment   Resistance Training Performed No   VAD Patient? No   Pain Assessment   Currently in Pain? No/denies         Goals Met:  Independence with exercise equipment Changing diet to healthy choices, watching portion sizes No report of cardiac concerns or symptoms  Goals Unmet:  Not Applicable  Comments: Doing well with exercise prescription progression.    Dr. Emily Filbert is Medical Director for [place] and [facility].

## 2015-09-08 NOTE — Addendum Note (Signed)
Addended by: Rudy Jew on: 09/08/2015 07:05 AM   Modules accepted: Orders

## 2015-09-10 ENCOUNTER — Encounter: Payer: Medicare Other | Admitting: *Deleted

## 2015-09-10 DIAGNOSIS — Z951 Presence of aortocoronary bypass graft: Secondary | ICD-10-CM | POA: Diagnosis not present

## 2015-09-10 NOTE — Progress Notes (Signed)
Daily Session Note  Patient Details  Name: [name] MRN: [id number] Date of Birth: 05-06-1941 Referring Provider:  Minna Merritts, MD  Encounter Date: 09/10/2015  Check In:     Session Check In - 09/10/15 0941    Check-In   Location [facility]   Staff Present Heath Lark, RN, BSN, CCRP;Timothy Townsel, RN, Alex Gardener, PT   Supervising physician immediately available to respond to emergencies See telemetry face sheet for immediately available ER MD   Medication changes reported     No   Fall or balance concerns reported    No   Warm-up and Cool-down Performed on first and last piece of equipment   Resistance Training Performed Yes   VAD Patient? No   Pain Assessment   Currently in Pain? No/denies         Goals Met:  Proper associated with RPD/PD & O2 Sat Exercise tolerated well  Goals Unmet:  Not Applicable  Comments:    Dr. Emily Filbert is Medical Director for [place] and [facility].

## 2015-09-10 NOTE — Progress Notes (Signed)
Cardiac Individual Treatment Plan  Patient Details  Name: [name] MRN: [id number] Date of Birth: 03-22-1941 Referring Provider:  Minna Merritts, MD  Initial Encounter Date:    Visit Diagnosis: S/P CABG x 4  Patient's Home Medications on Admission:  Current outpatient prescriptions:  .  aspirin 81 MG tablet, Take 81 mg by mouth daily.  , Disp: , Rfl:  .  clopidogrel (PLAVIX) 75 MG tablet, TAKE 1 TABLET EVERY DAY, Disp: 90 tablet, Rfl: 3 .  diazepam (VALIUM) 5 MG tablet, Take 1 tablet (5 mg total) by mouth every 6 (six) hours as needed for anxiety., Disp: 30 tablet, Rfl: 0 .  fexofenadine (ALLEGRA) 180 MG tablet, Take 180 mg by mouth 2 (two) times a week., Disp: , Rfl:  .  lisinopril (PRINIVIL,ZESTRIL) 2.5 MG tablet, TAKE 1 TABLET BY MOUTH EVERY DAY, Disp: 30 tablet, Rfl: 3 .  metoprolol tartrate (LOPRESSOR) 25 MG tablet, TAKE 1/2 TABLET BY MOUTH 2 TIMES A DAY, Disp: 30 tablet, Rfl: 3 .  Multiple Vitamins-Minerals (CENTRUM PO), Take by mouth daily.  , Disp: , Rfl:  .  Omega-3 Fatty Acids (FISH OIL) 1000 MG CAPS, Take 1,000 mg by mouth 2 (two) times daily., Disp: , Rfl:  .  simvastatin (ZOCOR) 40 MG tablet, TAKE 1 TABLET EVERY DAY, Disp: 90 tablet, Rfl: 3  Past Medical History: Past Medical History  Diagnosis Date  . CAD (coronary artery disease)     a. s/p DES to LAD and RCA (2001/2005);  b. 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  Marland Kitchen Hyperlipidemia, mixed   . Essential hypertension   . ED (erectile dysfunction) of non-organic origin   . History of pneumonia   . Kidney stones   . Arthritis   . Leg cramps   . Post-operative Atrial Fibrillation     Tobacco Use: History  Smoking status  . Never Smoker   Smokeless tobacco  . Not on file    Comment: tobacco use- no    Labs: Recent Review Flowsheet Data    Labs for ITP Cardiac and Pulmonary Rehab Latest Ref Rng 05/10/2015 05/10/2015 05/10/2015 05/11/2015 05/11/2015   PHART 7.350 - 7.450 7.380  7.340(L) - 7.388 -   PCO2ART 35.0 - 45.0 mmHg 42.0 44.9 - 39.4 -   HCO3 20.0 - 24.0 mEq/L 24.7(H) 24.0 - 23.6 -   TCO2 0 - 100 mmol/L _0 ACIDBASEDEF 0.0 - 2.0 mmol/L - 2.0 - 1.0 -   O2SAT - 97.0 95.0 - 91.0 -       Exercise Target Goals:    Exercise Program Goal: Individual exercise prescription set with THRR, safety & activity barriers. Participant demonstrates ability to understand and report RPE using BORG scale, to self-measure pulse accurately, and to acknowledge the importance of the exercise prescription.  Exercise Prescription Goal: Starting with aerobic activity 30 plus minutes a day, 3 days per week for initial exercise prescription. Provide home exercise prescription and guidelines that participant acknowledges understanding prior to discharge.  Activity Barriers & Risk Stratification:     Activity Barriers & Cardiac Risk Stratification - 07/08/15 1453    Activity Barriers & Cardiac Risk Stratification   Activity Barriers Other (comment)  Back problems      6 Minute Walk:     6 Minute Walk      07/08/15 1416       6 Minute Walk   Phase Initial     Distance 1090 feet  Walk Time 6 minutes     RPE 12     Perceived Dyspnea  2     Symptoms No     Resting HR 80 bpm     Resting BP 128/80 mmHg     Max Ex. HR 102 bpm     Max Ex. BP 128/78 mmHg        Initial Exercise Prescription:     Initial Exercise Prescription - 07/08/15 1400    Date of Initial Exercise Prescription   Date 07/08/15   Treadmill   MPH 1.8   Grade 0   Minutes 10   Recumbant Bike   Level 2   RPM 40   Watts 20   Minutes 10   NuStep   Level 2   Watts 50   Minutes 10   Arm Ergometer   Level 1   Watts 10   Minutes 10   Recumbant Elliptical   Level 2   RPM 40   Watts 20   Minutes 10   REL-XR   Level 3   Watts 50   Minutes 10   T5 Nustep   Level 1   Watts 20   Minutes 10   Biostep-RELP   Level 2   Watts 45   Minutes 10   Prescription Details    Frequency (times per week) 3   Duration Progress to 30 minutes of continuous aerobic without signs/symptoms of physical distress   Intensity   THRR REST +  30   Ratings of Perceived Exertion 11-15   Perceived Dyspnea 2-4   Progression   Progression Continue progressive overload as per policy without signs/symptoms or physical distress.   Resistance Training   Training Prescription Yes   Weight 2   Reps 10-15      Exercise Prescription Changes:     Exercise Prescription Changes      07/16/15 1100 07/26/15 0723 08/04/15 0800 08/18/15 0900 08/20/15 0900   Exercise Review   Progression Yes Yes Yes     Response to Exercise   Blood Pressure (Admit)  150/90 mmHg      Blood Pressure (Exercise)  144/84 mmHg      Blood Pressure (Exit)  116/64 mmHg      Heart Rate (Admit)  75 bpm      Heart Rate (Exercise)  101 bpm      Heart Rate (Exit)  84 bpm      Rating of Perceived Exertion (Exercise)  11      Symptoms _0    Comments Today was [name]'s second day of exercise.Reviewed orientation to the gym and the equipment functions and settings. Procedures and policies of the gym were outlined and explained. The patient's individual exercise prescription and treatment plan were reviewed with them. All starting workloads were established based on the results of the functional testing  done at the initial intake visit. The plan for exercise progression was also introduced and progression will be customized based on the patient's performance and goals. [name] has a long history of being physically active and can exercise safely for the full class time. He is also exercising at home and this has been approved by his MD. He mostly walks at home on the treadmill. He does not use heavy weights at home at this time due to MD's orders. He is allowed to lift anything under 15 lbs. We will continually progress him in the program using intensity.  Reviewed individualized exercise prescription and  made increases per departmental policy. Exercise increases were discussed with the patient and they were able to perform the new work loads without issue (no signs or symptoms).   Education only today, no exercise. No charge. Education only today, no exercise. No charge.   Frequency Add 1 additional day to program exercise sessions. Add 3 additional days to program exercise sessions.  Marl walks and lifts weights at home Add 3 additional days to program exercise sessions.  Moksh walks and lifts weights at home Add 3 additional days to program exercise sessions.  Yvan walks and lifts weights at home Add 3 additional days to program exercise sessions.  Jovanne walks and lifts weights at home   Duration Progress to 50 minutes of aerobic without signs/symptoms of physical distress Progress to 50 minutes of aerobic without signs/symptoms of physical distress Progress to 50 minutes of aerobic without signs/symptoms of physical distress Progress to 50 minutes of aerobic without signs/symptoms of physical distress Progress to 50 minutes of aerobic without signs/symptoms of physical distress   Intensity Rest + 30 Rest + 30 Rest + 30 Rest + 30 Rest + 30   Progression   Progression Continue progressive overload as per policy without signs/symptoms or physical distress. Continue progressive overload as per policy without signs/symptoms or physical distress. Continue progressive overload as per policy without signs/symptoms or physical distress. Continue progressive overload as per policy without signs/symptoms or physical distress. Continue progressive overload as per policy without signs/symptoms or physical distress.   Horticulturist, commercial Prescription (read-only) _0    Weight (read-only) _1 Reps (read-only) 10-15 10-15 10-15 10-15 10-15   Treadmill   MPH (read-only) 3.6 3.6 3.6 3.6 3.6   Grade (read-only) _2 Minutes (read-only) 45 45 60 60 60     08/23/15  1400 09/03/15 0900         Exercise Review   Progression Yes Yes      Response to Exercise   Blood Pressure (Admit) 122/74 mmHg       Blood Pressure (Exercise) 142/70 mmHg       Blood Pressure (Exit) 124/70 mmHg       Heart Rate (Admit) 76 bpm       Heart Rate (Exercise) 104 bpm       Heart Rate (Exit) 96 bpm       Rating of Perceived Exertion (Exercise) 12       Symptoms None       Comments Demarri is very adamant about maintaining the same exercise routine in class. He is non compliant in trying anything different. With the warmer weather he has increaed his walking outside and has therefore progressed in time with his home exercise routine.        Frequency Add 3 additional days to program exercise sessions.  Maintains his home exercise routine --  Maintains his home exercise routine      Duration Progress to 50 minutes of aerobic without signs/symptoms of physical distress       Intensity Rest + 30       Progression   Progression Continue progressive overload as per policy without signs/symptoms or physical distress.       Resistance Training   Training Prescription (read-only) Yes Yes      Weight (read-only) 10 10      Reps (read-only) 10-15 10-15      Treadmill   MPH (read-only)  3.6 3.6      Grade (read-only) 6 6      Minutes (read-only) 60 60         Discharge Exercise Prescription (Final Exercise Prescription Changes):     Exercise Prescription Changes - 09/03/15 0900    Exercise Review   Progression Yes   Response to Exercise   Frequency --  Maintains his home exercise routine   Resistance Training   Training Prescription (read-only) Yes   Weight (read-only) 10   Reps (read-only) 10-15   Treadmill   MPH (read-only) 3.6   Grade (read-only) 6   Minutes (read-only) 60      Nutrition:  Target Goals: Understanding of nutrition guidelines, daily intake of sodium '1500mg'$ , cholesterol '200mg'$ , calories 30% from fat and 7% or less from saturated fats, daily to  have 5 or more servings of fruits and vegetables.  Biometrics:     Pre Biometrics - 07/08/15 1419    Pre Biometrics   Height 5' 10.4" (1.788 m)   Weight 220 lb 8 oz (100.018 kg)   Waist Circumference 45 inches   Hip Circumference 44 inches   Waist to Hip Ratio 1.02 %   BMI (Calculated) 31.3       Nutrition Therapy Plan and Nutrition Goals:     Nutrition Therapy & Goals - 09/03/15 1057    Nutrition Therapy   Diet --  Patient was scheduled to meet with RD, but did not stay.      Nutrition Discharge: Rate Your Plate Scores:     Nutrition Assessments - 09/10/15 1033    Rate Your Plate Scores   Post Score 61   Post Score % 67 %      Nutrition Goals Re-Evaluation:   Psychosocial: Target Goals: Acknowledge presence or absence of depression, maximize coping skills, provide positive support system. Participant is able to verbalize types and ability to use techniques and skills needed for reducing stress and depression.  Initial Review & Psychosocial Screening:     Initial Psych Review & Screening - 07/08/15 1500    Family Dynamics   Good Support System? Yes  Ahmet's wife died 2 years ago but his daughter lives nearby.    Barriers   Psychosocial barriers to participate in program There are no identifiable barriers or psychosocial needs.   Screening Interventions   Interventions Encouraged to exercise      Quality of Life Scores:     Quality of Life - 09/10/15 1041    Quality of Life Scores   Health/Function Post 24.53 %   Health/Function % Change 8 %   Socioeconomic Post 25.71 %   Socioeconomic % Change -1 %   Psych/Spiritual Post 26.79 %   Psych/Spiritual % Change 8 %   Family Post 28 %   Family % Change 17 %   GLOBAL Post 25.75 %   GLOBAL % Change 8 %      PHQ-9:     Recent Review Flowsheet Data    Depression screen [facility] 2/9 09/10/2015 07/08/2015   Decreased Interest 0 0   Down, Depressed, Hopeless 0 0   PHQ - 2 Score 0 0   Altered sleeping 0 0    Tired, decreased energy 0 3   Change in appetite 0 0   Feeling bad or failure about yourself  0 0   Trouble concentrating 0 0   Moving slowly or fidgety/restless 0 0   Suicidal thoughts 0 0   PHQ-9 Score 0 3  Difficult doing work/chores Not difficult at all Somewhat difficult      Psychosocial Evaluation and Intervention:     Psychosocial Evaluation - 08/02/15 1010    Psychosocial Evaluation & Interventions   Interventions Encouraged to exercise with the program and follow exercise prescription   Comments Counselor met with Mr. Mcewan today for his initial psychosocial evaluation.  He is a 75 year old who had quadruple bypass surgery on 05/10/15.  He has a strong support system with (2) adult daughters who live close by and check on him frequently, as well as active involvement in his local faith community.  Mr. Peak reports sleeping well and having a good appetite.  He denies a history of depression or anxiety or current symtpoms.  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  Mr. Shuler has been walking on the treadmill for years consistently and his goal for this program is to increase his distance and speed while being monitored, so he can return to doing it on his own.  Counselor encouraged Mr. Matsuo to pace himself during his recovery time.     Continued Psychosocial Services Needed Yes  Mr. Goldston will benefit from the stress management educational component of this program in particular.        Psychosocial Re-Evaluation:     Psychosocial Re-Evaluation      08/20/15 0904           Psychosocial Re-Evaluation   Comments Jakoby said he is going to work this weekend. 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           Vocational Rehabilitation: Provide vocational rehab  assistance to qualifying candidates.   Vocational Rehab Evaluation & Intervention:     Vocational Rehab - 07/08/15 1455    Initial Vocational Rehab Evaluation & Intervention   Assessment shows need for Vocational Rehabilitation No      Education: Education Goals: Education classes will be provided on a weekly basis, covering required topics. Participant will state understanding/return demonstration of topics presented.  Learning Barriers/Preferences:     Learning Barriers/Preferences - 07/08/15 1454    Learning Barriers/Preferences   Learning Barriers None   Learning Preferences Written Material      Education Topics: General Nutrition Guidelines/Fats and Fiber: -Group instruction provided by verbal, written material, models and posters to present the general guidelines for heart healthy nutrition. Gives an explanation and review of dietary fats and fiber.   Controlling Sodium/Reading Food Labels: -Group verbal and written material supporting the discussion of sodium use in heart healthy nutrition. Review and explanation with models, verbal and written materials for utilization of the food label.          Cardiac Rehab from 09/08/2015 in [facility] Cardiac and Pulmonary Rehab   Date  08/23/15   Educator  CR   Instruction Review Code  2- meets goals/outcomes      Exercise Physiology & Risk Factors: - Group verbal and written instruction with models to review the exercise physiology of the cardiovascular system and associated critical values. Details cardiovascular disease risk factors and the goals associated with each risk factor.      Cardiac Rehab from 09/08/2015 in [facility] Cardiac and Pulmonary Rehab   Date  09/06/15  Educator  RM   Instruction Review Code  2- meets goals/outcomes      Aerobic Exercise & Resistance Training: - Gives group verbal and written discussion on the health impact of inactivity. On the components of aerobic and resistive training programs and the  benefits of this training and how to safely progress through these programs.      Cardiac Rehab from 09/08/2015 in [facility] Cardiac and Pulmonary Rehab   Date  09/08/15   Educator  SW   Instruction Review Code  2- meets goals/outcomes      Flexibility, Balance, General Exercise Guidelines: - Provides group verbal and written instruction on the benefits of flexibility and balance training programs. Provides general exercise guidelines with specific guidelines to those with heart or lung disease. Demonstration and skill practice provided.      Cardiac Rehab from 09/08/2015 in [facility] Cardiac and Pulmonary Rehab   Date  07/26/15   Educator  RM   Instruction Review Code  2- meets goals/outcomes      Stress Management: - Provides group verbal and written instruction about the health risks of elevated stress, cause of high stress, and healthy ways to reduce stress.      Cardiac Rehab from 09/08/2015 in [facility] Cardiac and Pulmonary Rehab   Date  07/21/15   Educator  [facility]   Instruction Review Code  2- meets goals/outcomes      Depression: - Provides group verbal and written instruction on the correlation between heart/lung disease and depressed mood, treatment options, and the stigmas associated with seeking treatment.      Cardiac Rehab from 09/08/2015 in [facility] Cardiac and Pulmonary Rehab   Date  09/01/15   Educator  [facility]   Instruction Review Code  2- meets goals/outcomes      Anatomy & Physiology of the Heart: - Group verbal and written instruction and models provide basic cardiac anatomy and physiology, with the coronary electrical and arterial systems. Review of: AMI, Angina, Valve disease, Heart Failure, Cardiac Arrhythmia, Pacemakers, and the ICD.      Cardiac Rehab from 09/08/2015 in [facility] Cardiac and Pulmonary Rehab   Date  08/02/15   Educator  SB   Instruction Review Code  2- meets goals/outcomes      Cardiac Procedures: - Group verbal and written instruction and models to describe the testing  methods done to diagnose heart disease. Reviews the outcomes of the test results. Describes the treatment choices: Medical Management, Angioplasty, or Coronary Bypass Surgery.   Cardiac Medications: - Group verbal and written instruction to review commonly prescribed medications for heart disease. Reviews the medication, class of the drug, and side effects. Includes the steps to properly store meds and maintain the prescription regimen.      Cardiac Rehab from 09/08/2015 in [facility] Cardiac and Pulmonary Rehab   Date  08/18/15   Educator  SB   Instruction Review Code  2- meets goals/outcomes      Go Sex-Intimacy & Heart Disease, Get SMART - Goal Setting: - Group verbal and written instruction through game format to discuss heart disease and the return to sexual intimacy. Provides group verbal and written material to discuss and apply goal setting through the application of the S.M.A.R.T. Method.   Other Matters of the Heart: - Provides group verbal, written materials and models to describe Heart Failure, Angina, Valve Disease, and Diabetes in the realm of heart disease. Includes description of the disease process and treatment options available to the cardiac patient.  Cardiac Rehab from 09/08/2015 in [facility] Cardiac and Pulmonary Rehab   Date  08/09/15   Educator  SB   Instruction Review Code  2- meets goals/outcomes      Exercise & Equipment Safety: - Individual verbal instruction and demonstration of equipment use and safety with use of the equipment.      Cardiac Rehab from 09/08/2015 in [facility] Cardiac and Pulmonary Rehab   Date  07/08/15   Educator  C. EnterkinRN   Instruction Review Code  1- partially meets, needs review/practice      Infection Prevention: - Provides verbal and written material to individual with discussion of infection control including proper hand washing and proper equipment cleaning during exercise session.      Cardiac Rehab from 09/08/2015 in [facility] Cardiac and  Pulmonary Rehab   Date  07/08/15   Educator  C. Vaden Becherer,RN   Instruction Review Code  2- meets goals/outcomes      Falls Prevention: - Provides verbal and written material to individual with discussion of falls prevention and safety.      Cardiac Rehab from 09/08/2015 in [facility] Cardiac and Pulmonary Rehab   Date  07/08/15   Educator  C. Jonthan Leite,RN   Instruction Review Code  2- meets goals/outcomes      Diabetes: - Individual verbal and written instruction to review signs/symptoms of diabetes, desired ranges of glucose level fasting, after meals and with exercise. Advice that pre and post exercise glucose checks will be done for 3 sessions at entry of program.    Knowledge Questionnaire Score:     Knowledge Questionnaire Score - 09/10/15 1031    Knowledge Questionnaire Score   Post Score 24      Personal Goals and Risk Factors at Admission:     Personal Goals and Risk Factors at Admission - 09/10/15 0942    Core Components/Risk Factors/Patient Goals on Admission   Hypertension Yes   Intervention Provide education on lifestyle modifcations including regular physical activity/exercise, weight management, moderate sodium restriction and increased consumption of fresh fruit, vegetables, and low fat dairy, alcohol moderation, and smoking cessation.;Monitor prescription use compliance.   Expected Outcomes Short Term: Continued assessment and intervention until BP is < 140/73m HG in hypertensive participants. < 130/85mHG in hypertensive participants with diabetes, heart failure or chronic kidney disease.;Long Term: Maintenance of blood pressure at goal levels.   Lipids Yes   Intervention Provide education and support for participant on nutrition & aerobic/resistive exercise along with prescribed medications to achieve LDL <7044mHDL >36m19m Expected Outcomes Short Term: Participant states understanding of desired cholesterol values and is compliant with medications prescribed.  Participant is following exercise prescription and nutrition guidelines.;Long Term: Cholesterol controlled with medications as prescribed, with individualized exercise RX and with personalized nutrition plan. Value goals: LDL < 70mg81mL > 40 mg.      Personal Goals and Risk Factors Review:      Goals and Risk Factor Review      07/16/15 0938 07/16/15 0941 07/27/15 0725 08/23/15 1454     Core Components/Risk Factors/Patient Goals Review   Personal Goals Review Increase Aerobic Exercise and Physical Activity;Hypertension;Lipids;Stress       Increase Aerobic Exercise and Physical Activity (read-only)   Goals Progress/Improvement seen    Yes Yes    Comments Spoke with RusseJoneen Carawayy to infoem him that the Ex Phys will work with him on his exercise goals.  HermaBronxery experienced with exercise and lives a very active lifestyle. He has  a treadmill at home and weights that he uses on days that he doesn't come to class. He has a routine and doesn't seem to want to change it. We will work with him on progressig in his routine or trying new things in order to provide his body with additional stimulus for improvement. We will follow up in February on how this progressing is going.  Taiyo is non compliant in switching his exercise routine or making any changes to it. It is the same thing he has done for years and he says that he does not want to try anything new. In an effort to encourage him where he is currently at , we are not going to revisit any exercise changes and continue to promote daily exercise into his routine which he has been good at doing. With the warmer weather he has been doing more outside and walking and this  reflects a progression in exercise time. Deklin's main goal is to finish the program so he can continue working and get back to his routine at home.     Hypertension (read-only)   Goal Participant will see blood pressure controlled within the values of 140/42m/Hg or within value  directed by their physician.       Comments Reviewed the factors for controlling his risk factor: meds,exercise and nutrition. Will contnue to follow with RJoneen Carawaythe steps he is completing to keep his risk factor under control. Correction name is not RJoneen Caraway but [place]      Abnormal Lipids (read-only)   Progress seen towards goals Unknown       Comments Reviewed the factors for controlling his risk factor: meds,exercise and nutrition. Will contnue to follow with RJoneen Carawaythe steps he is completing to keep his risk factor under control. Correction name is not RJoneen Caraway but HGraford      Stress (read-only)   Comments Reviewed the factors for controlling his risk factor: meds,exercise and nutrition. Mental health counselor will talk to RJoneen Carawaysoon for further advice. Will contnue to follow with RJoneen Carawaythe steps he is completing to keep his risk factor under control. Correction name is not RJoneen Caraway but [place]         Personal Goals Discharge (Final Personal Goals and Risk Factors Review):      Goals and Risk Factor Review - 08/23/15 1454    Increase Aerobic Exercise and Physical Activity (read-only)   Goals Progress/Improvement seen  Yes   Comments HMisteris non compliant in switching his exercise routine or making any changes to it. It is the same thing he has done for years and he says that he does not want to try anything new. In an effort to encourage him where he is currently at , we are not going to revisit any exercise changes and continue to promote daily exercise into his routine which he has been good at doing. With the warmer weather he has been doing more outside and walking and this  reflects a progression in exercise time. Taris's main goal is to finish the program so he can continue working and get back to his routine at home.       ITP Comments:     ITP Comments      07/11/15 1217 08/05/15 1155 08/27/15 0854 08/31/15 0748 09/10/15 0941   ITP Comments Ready for 30 day review.   Continue with ITP  Has attended orientation, will start sessions first week of Jan. Ready for 30 day review. Continue with ITP. HRonenlikes  to exercise on the treadmill.  30 day review.  Continue with ITP Ashon is meeting individually with the registered dieticians.      Comments:

## 2015-09-10 NOTE — Progress Notes (Signed)
Discharge Summary  Patient Details  Name: [name] MRN: [id number] Date of Birth: 03/09/1941 Referring Provider:  Antonieta IbaGollan, Timothy J, MD   Number of Visits:   Reason for Discharge:  Patient reached a stable level of exercise. Patient independent in their exercise.  Smoking History:  History  Smoking status  . Never Smoker   Smokeless tobacco  . Not on file    Comment: tobacco use- no    Diagnosis:  S/P CABG x 4  ADL UCSD:   Initial Exercise Prescription:     Initial Exercise Prescription - 07/08/15 1400    Date of Initial Exercise Prescription   Date 07/08/15   Treadmill   MPH 1.8   Grade 0   Minutes 10   Recumbant Bike   Level 2   RPM 40   Watts 20   Minutes 10   NuStep   Level 2   Watts 50   Minutes 10   Arm Ergometer   Level 1   Watts 10   Minutes 10   Recumbant Elliptical   Level 2   RPM 40   Watts 20   Minutes 10   REL-XR   Level 3   Watts 50   Minutes 10   T5 Nustep   Level 1   Watts 20   Minutes 10   Biostep-RELP   Level 2   Watts 45   Minutes 10   Prescription Details   Frequency (times per week) 3   Duration Progress to 30 minutes of continuous aerobic without signs/symptoms of physical distress   Intensity   THRR REST +  30   Ratings of Perceived Exertion 11-15   Perceived Dyspnea 2-4   Progression   Progression Continue progressive overload as per policy without signs/symptoms or physical distress.   Resistance Training   Training Prescription Yes   Weight 2   Reps 10-15      Discharge Exercise Prescription (Final Exercise Prescription Changes):     Exercise Prescription Changes - 09/03/15 0900    Exercise Review   Progression Yes   Response to Exercise   Frequency --  Maintains his home exercise routine   Resistance Training   Training Prescription (read-only) Yes   Weight (read-only) 10   Reps (read-only) 10-15   Treadmill   MPH (read-only) 3.6   Grade (read-only) 6   Minutes (read-only) 60       Functional Capacity:     6 Minute Walk      07/08/15 1416       6 Minute Walk   Phase Initial     Distance 1090 feet     Walk Time 6 minutes     RPE 12     Perceived Dyspnea  2     Symptoms No     Resting HR 80 bpm     Resting BP 128/80 mmHg     Max Ex. HR 102 bpm     Max Ex. BP 128/78 mmHg        Psychological, QOL, Others - Outcomes: PHQ 2/9: [facility] 09/10/2015 07/08/2015  Decreased Interest 0 0  Down, Depressed, Hopeless 0 0  PHQ - 2 Score 0 0  Altered sleeping 0 0  Tired, decreased energy 0 3  Change in appetite 0 0  Feeling bad or failure about yourself  0 0  Trouble concentrating 0 0  Moving slowly or fidgety/restless 0 0  Suicidal thoughts 0 0  PHQ-9 Score  0 3  Difficult doing work/chores Not difficult at all Somewhat difficult    Quality of Life:     Quality of Life - 09/10/15 1041    Quality of Life Scores   Health/Function Post 24.53 %   Health/Function % Change 8 %   Socioeconomic Post 25.71 %   Socioeconomic % Change -1 %   Psych/Spiritual Post 26.79 %   Psych/Spiritual % Change 8 %   Family Post 28 %   Family % Change 17 %   GLOBAL Post 25.75 %   GLOBAL % Change 8 %      Personal Goals: Goals established at orientation with interventions provided to work toward goal.     Personal Goals and Risk Factors at Admission - 09/10/15 0942    Core Components/Risk Factors/Patient Goals on Admission   Hypertension Yes   Intervention Provide education on lifestyle modifcations including regular physical activity/exercise, weight management, moderate sodium restriction and increased consumption of fresh fruit, vegetables, and low fat dairy, alcohol moderation, and smoking cessation.;Monitor prescription use compliance.   Expected Outcomes Short Term: Continued assessment and intervention until BP is < 140/92mm HG in hypertensive participants. < 130/55mm HG in hypertensive participants with diabetes, heart failure or chronic kidney  disease.;Long Term: Maintenance of blood pressure at goal levels.   Lipids Yes   Intervention Provide education and support for participant on nutrition & aerobic/resistive exercise along with prescribed medications to achieve LDL 70mg , HDL >40mg .   Expected Outcomes Short Term: Participant states understanding of desired cholesterol values and is compliant with medications prescribed. Participant is following exercise prescription and nutrition guidelines.;Long Term: Cholesterol controlled with medications as prescribed, with individualized exercise RX and with personalized nutrition plan. Value goals: LDL < , HDL > 40 mg.       Personal Goals Discharge:     Goals and Risk Factor Review      07/16/15 0938 07/16/15 0941 07/27/15 0725 08/23/15 1454     Core Components/Risk Factors/Patient Goals Review   Personal Goals Review Increase Aerobic Exercise and Physical Activity;Hypertension;Lipids;Stress       Increase Aerobic Exercise and Physical Activity (read-only)   Goals Progress/Improvement seen    Yes Yes    Comments Spoke with [name] today to infoem him that the Ex Phys will work with him on his exercise goals.  [name] is very experienced with exercise and lives a very active lifestyle. He has a treadmill at home and weights that he uses on days that he doesn't come to class. He has a routine and doesn't seem to want to change it. We will work with him on progressig in his routine or trying new things in order to provide his body with additional stimulus for improvement. We will follow up in February on how this progressing is going.  [name] is non compliant in switching his exercise routine or making any changes to it. It is the same thing he has done for years and he says that he does not want to try anything new. In an effort to encourage him where he is currently at , we are not going to revisit any exercise changes and continue to promote daily exercise into his routine which he has  been good at doing. With the warmer weather he has been doing more outside and walking and this  reflects a progression in exercise time. [name]'s main goal is to finish the program so he can continue working and get back to his routine at home.  Hypertension (read-only)   Goal Participant will see blood pressure controlled within the values of 140/77mm/Hg or within value directed by their physician.       Comments Reviewed the factors for controlling his risk factor: meds,exercise and nutrition. Will contnue to follow with [name] the steps he is completing to keep his risk factor under control. Correction name is not [name], but [place].      Abnormal Lipids (read-only)   Progress seen towards goals Unknown       Comments Reviewed the factors for controlling his risk factor: meds,exercise and nutrition. Will contnue to follow with [name] the steps he is completing to keep his risk factor under control. Correction name is not [name], but [place].      Stress (read-only)   Comments Reviewed the factors for controlling his risk factor: meds,exercise and nutrition. Mental health counselor will talk to [name] soon for further advice. Will contnue to follow with [name] the steps he is completing to keep his risk factor under control. Correction name is not [name], but [place].         Nutrition & Weight - Outcomes:     Pre Biometrics - 07/08/15 1419    Pre Biometrics   Height 5' 10.4" (1.788 m)   Weight 220 lb 8 oz (100.018 kg)   Waist Circumference 45 inches   Hip Circumference 44 inches   Waist to Hip Ratio 1.02 %   BMI (Calculated) 31.3       Nutrition:     Nutrition Therapy & Goals - 09/03/15 1057    Nutrition Therapy   Diet --  Patient was scheduled to meet with RD, but did not stay.      Nutrition Discharge:     Nutrition Assessments - 09/10/15 1033    Rate Your Plate Scores   Post Score 61   Post Score % 67 %      Education Questionnaire Score:     Knowledge  Questionnaire Score - 09/10/15 1031    Knowledge Questionnaire Score   Post Score 24      Goals reviewed with patient; copy given to patient.

## 2015-09-10 NOTE — Progress Notes (Signed)
Discharge Summary  Patient Details  Name: [name] MRN: [id number] Date of Birth: 02/09/1941 Referring Provider:  Gollan, Timothy J, MD   Number of Visits:   Reason for Discharge:  Patient reached a stable level of exercise. Patient independent in their exercise.  Smoking History:  History  Smoking status  . Never Smoker   Smokeless tobacco  . Not on file    Comment: tobacco use- no    Diagnosis:  S/P CABG x 4  ADL UCSD:   Initial Exercise Prescription:     Initial Exercise Prescription - 07/08/15 1400    Date of Initial Exercise Prescription   Date 07/08/15   Treadmill   MPH 1.8   Grade 0   Minutes 10   Recumbant Bike   Level 2   RPM 40   Watts 20   Minutes 10   NuStep   Level 2   Watts 50   Minutes 10   Arm Ergometer   Level 1   Watts 10   Minutes 10   Recumbant Elliptical   Level 2   RPM 40   Watts 20   Minutes 10   REL-XR   Level 3   Watts 50   Minutes 10   T5 Nustep   Level 1   Watts 20   Minutes 10   Biostep-RELP   Level 2   Watts 45   Minutes 10   Prescription Details   Frequency (times per week) 3   Duration Progress to 30 minutes of continuous aerobic without signs/symptoms of physical distress   Intensity   THRR REST +  30   Ratings of Perceived Exertion 11-15   Perceived Dyspnea 2-4   Progression   Progression Continue progressive overload as per policy without signs/symptoms or physical distress.   Resistance Training   Training Prescription Yes   Weight 2   Reps 10-15      Discharge Exercise Prescription (Final Exercise Prescription Changes):     Exercise Prescription Changes - 09/03/15 0900    Exercise Review   Progression Yes   Response to Exercise   Frequency --  Maintains his home exercise routine   Resistance Training   Training Prescription (read-only) Yes   Weight (read-only) 10   Reps (read-only) 10-15   Treadmill   MPH (read-only) 3.6   Grade (read-only) 6   Minutes (read-only) 60       Functional Capacity:     6 Minute Walk      07/08/15 1416       6 Minute Walk   Phase Initial     Distance 1090 feet     Walk Time 6 minutes     RPE 12     Perceived Dyspnea  2     Symptoms No     Resting HR 80 bpm     Resting BP 128/80 mmHg     Max Ex. HR 102 bpm     Max Ex. BP 128/78 mmHg        Psychological, QOL, Others - Outcomes: PHQ 2/9: Depression screen PHQ 2/9 09/10/2015 07/08/2015  Decreased Interest 0 0  Down, Depressed, Hopeless 0 0  PHQ - 2 Score 0 0  Altered sleeping 0 0  Tired, decreased energy 0 3  Change in appetite 0 0  Feeling bad or failure about yourself  0 0  Trouble concentrating 0 0  Moving slowly or fidgety/restless 0 0  Suicidal thoughts 0 0  PHQ-9 Score   0 3  Difficult doing work/chores Not difficult at all Somewhat difficult    Quality of Life:     Quality of Life - 09/10/15 1041    Quality of Life Scores   Health/Function Post 24.53 %   Health/Function % Change 8 %   Socioeconomic Post 25.71 %   Socioeconomic % Change -1 %   Psych/Spiritual Post 26.79 %   Psych/Spiritual % Change 8 %   Family Post 28 %   Family % Change 17 %   GLOBAL Post 25.75 %   GLOBAL % Change 8 %      Personal Goals: Goals established at orientation with interventions provided to work toward goal.     Personal Goals and Risk Factors at Admission - 09/10/15 0942    Core Components/Risk Factors/Patient Goals on Admission   Hypertension Yes   Intervention Provide education on lifestyle modifcations including regular physical activity/exercise, weight management, moderate sodium restriction and increased consumption of fresh fruit, vegetables, and low fat dairy, alcohol moderation, and smoking cessation.;Monitor prescription use compliance.   Expected Outcomes Short Term: Continued assessment and intervention until BP is < 140/90mm HG in hypertensive participants. < 130/80mm HG in hypertensive participants with diabetes, heart failure or chronic kidney  disease.;Long Term: Maintenance of blood pressure at goal levels.   Lipids Yes   Intervention Provide education and support for participant on nutrition & aerobic/resistive exercise along with prescribed medications to achieve LDL <70mg, HDL >40mg.   Expected Outcomes Short Term: Participant states understanding of desired cholesterol values and is compliant with medications prescribed. Participant is following exercise prescription and nutrition guidelines.;Long Term: Cholesterol controlled with medications as prescribed, with individualized exercise RX and with personalized nutrition plan. Value goals: LDL < 70mg, HDL > 40 mg.       Personal Goals Discharge:     Goals and Risk Factor Review      07/16/15 0938 07/16/15 0941 07/27/15 0725 08/23/15 1454     Core Components/Risk Factors/Patient Goals Review   Personal Goals Review Increase Aerobic Exercise and Physical Activity;Hypertension;Lipids;Stress       Increase Aerobic Exercise and Physical Activity (read-only)   Goals Progress/Improvement seen    Yes Yes    Comments Spoke with [name] today to infoem him that the Ex Phys will work with him on his exercise goals.  [name] is very experienced with exercise and lives a very active lifestyle. He has a treadmill at home and weights that he uses on days that he doesn't come to class. He has a routine and doesn't seem to want to change it. We will work with him on progressig in his routine or trying new things in order to provide his body with additional stimulus for improvement. We will follow up in February on how this progressing is going.  [name] is non compliant in switching his exercise routine or making any changes to it. It is the same thing he has done for years and he says that he does not want to try anything new. In an effort to encourage him where he is currently at , we are not going to revisit any exercise changes and continue to promote daily exercise into his routine which he has  been good at doing. With the warmer weather he has been doing more outside and walking and this  reflects a progression in exercise time. [name]'s main goal is to finish the program so he can continue working and get back to his routine at home.       Hypertension (read-only)   Goal Participant will see blood pressure controlled within the values of 140/90mm/Hg or within value directed by their physician.       Comments Reviewed the factors for controlling his risk factor: meds,exercise and nutrition. Will contnue to follow with [name] the steps he is completing to keep his risk factor under control. Correction name is not [name], but [name].      Abnormal Lipids (read-only)   Progress seen towards goals Unknown       Comments Reviewed the factors for controlling his risk factor: meds,exercise and nutrition. Will contnue to follow with [name] the steps he is completing to keep his risk factor under control. Correction name is not [name], but [name].      Stress (read-only)   Comments Reviewed the factors for controlling his risk factor: meds,exercise and nutrition. Mental health counselor will talk to [name] soon for further advice. Will contnue to follow with [name] the steps he is completing to keep his risk factor under control. Correction name is not [name], but [name].         Nutrition & Weight - Outcomes:     Pre Biometrics - 07/08/15 1419    Pre Biometrics   Height 5' 10.4" (1.788 m)   Weight 220 lb 8 oz (100.018 kg)   Waist Circumference 45 inches   Hip Circumference 44 inches   Waist to Hip Ratio 1.02 %   BMI (Calculated) 31.3       Nutrition:     Nutrition Therapy & Goals - 09/03/15 1057    Nutrition Therapy   Diet --  Patient was scheduled to meet with RD, but did not stay.      Nutrition Discharge:     Nutrition Assessments - 09/10/15 1033    Rate Your Plate Scores   Post Score 61   Post Score % 67 %      Education Questionnaire Score:     Knowledge  Questionnaire Score - 09/10/15 1031    Knowledge Questionnaire Score   Post Score 24      Goals reviewed with patient; copy given to patient.  

## 2015-09-13 DIAGNOSIS — Z951 Presence of aortocoronary bypass graft: Secondary | ICD-10-CM | POA: Diagnosis not present

## 2015-09-13 NOTE — Progress Notes (Signed)
Daily Session Note  Patient Details  Name: JARYAN CHICOINE MRN: [id number] Date of Birth: 04-21-1941 Referring Provider:  Minna Merritts, MD  Encounter Date: 09/13/2015  Check In:     Session Check In - 09/13/15 0842    Check-In   Location [facility]   Staff Present Heath Lark, RN, BSN, CCRP;Talaysha Freeberg, Sherril Cong, BS, ACSM CEP, Exercise Physiologist   Supervising physician immediately available to respond to emergencies See telemetry face sheet for immediately available ER MD   Medication changes reported     No   Fall or balance concerns reported    No   Warm-up and Cool-down Performed on first and last piece of equipment   Resistance Training Performed Yes   VAD Patient? No   Pain Assessment   Currently in Pain? No/denies         Goals Met:  Independence with exercise equipment Exercise tolerated well No report of cardiac concerns or symptoms  Goals Unmet:  Not Applicable  Comments: Patient completed exercise prescription and all exercise goals during rehab session. The exercise was tolerated well and the patient is progressing in the program.    Dr. Emily Filbert is Medical Director for [facility].

## 2015-09-13 NOTE — Patient Instructions (Signed)
Discharge Instructions  Patient Details  Name: [name] MRN: [id number] Date of Birth: [birth year] Referring Provider:  Antonieta Iba, MD   Number of Visits: 63  Reason for Discharge:  Patient reached a stable level of exercise. Patient independent in their exercise.  Smoking History:  History  Smoking status  . Never Smoker   Smokeless tobacco  . Not on file    Comment: tobacco use- no    Diagnosis:  S/P CABG x 4  Initial Exercise Prescription:     Initial Exercise Prescription - 07/08/15 1400    Date of Initial Exercise Prescription   Date 07/08/15   Treadmill   MPH 1.8   Grade 0   Minutes 10   Recumbant Bike   Level 2   RPM 40   Watts 20   Minutes 10   NuStep   Level 2   Watts 50   Minutes 10   Arm Ergometer   Level 1   Watts 10   Minutes 10   Recumbant Elliptical   Level 2   RPM 40   Watts 20   Minutes 10   REL-XR   Level 3   Watts 50   Minutes 10   T5 Nustep   Level 1   Watts 20   Minutes 10   Biostep-RELP   Level 2   Watts 45   Minutes 10   Prescription Details   Frequency (times per week) 3   Duration Progress to 30 minutes of continuous aerobic without signs/symptoms of physical distress   Intensity   THRR REST +  30   Ratings of Perceived Exertion 11-15   Perceived Dyspnea 2-4   Progression   Progression Continue progressive overload as per policy without signs/symptoms or physical distress.   Resistance Training   Training Prescription Yes   Weight 2   Reps 10-15      Discharge Exercise Prescription (Final Exercise Prescription Changes):     Exercise Prescription Changes - 09/03/15 0900    Exercise Review   Progression Yes   Response to Exercise   Frequency --  Maintains his home exercise routine   Resistance Training   Training Prescription (read-only) Yes   Weight (read-only) 10   Reps (read-only) 10-15   Treadmill   MPH (read-only) 3.6   Grade (read-only) 6   Minutes (read-only) 60       Functional Capacity:     6 Minute Walk      07/08/15 1416       6 Minute Walk   Phase Initial     Distance 1090 feet     Walk Time 6 minutes     RPE 12     Perceived Dyspnea  2     Symptoms No     Resting HR 80 bpm     Resting BP 128/80 mmHg     Max Ex. HR 102 bpm     Max Ex. BP 128/78 mmHg        Quality of Life:     Quality of Life - 09/10/15 1041    Quality of Life Scores   Health/Function Post 24.53 %   Health/Function % Change 8 %   Socioeconomic Post 25.71 %   Socioeconomic % Change -1 %   Psych/Spiritual Post 26.79 %   Psych/Spiritual % Change 8 %   Family Post 28 %   Family % Change 17 %   GLOBAL Post 25.75 %   GLOBAL %  Change 8 %      Personal Goals: Goals established at orientation with interventions provided to work toward goal.     Personal Goals and Risk Factors at Admission - 09/10/15 0942    Core Components/Risk Factors/Patient Goals on Admission   Hypertension Yes   Intervention Provide education on lifestyle modifcations including regular physical activity/exercise, weight management, moderate sodium restriction and increased consumption of fresh fruit, vegetables, and low fat dairy, alcohol moderation, and smoking cessation.;Monitor prescription use compliance.   Expected Outcomes Short Term: Continued assessment and intervention until BP is < 140/4890mm HG in hypertensive participants. < 130/5580mm HG in hypertensive participants with diabetes, heart failure or chronic kidney disease.;Long Term: Maintenance of blood pressure at goal levels.   Lipids Yes   Intervention Provide education and support for participant on nutrition & aerobic/resistive exercise along with prescribed medications to achieve LDL 70mg , HDL >40mg .   Expected Outcomes Short Term: Participant states understanding of desired cholesterol values and is compliant with medications prescribed. Participant is following exercise prescription and nutrition guidelines.;Long Term:  Cholesterol controlled with medications as prescribed, with individualized exercise RX and with personalized nutrition plan. Value goals: LDL < 70mg , HDL > 40 mg.       Personal Goals Discharge:     Goals and Risk Factor Review - 08/23/15 1454    Increase Aerobic Exercise and Physical Activity (read-only)   Goals Progress/Improvement seen  Yes   Comments Chase PicketHerman is non compliant in switching his exercise routine or making any changes to it. It is the same thing he has done for years and he says that he does not want to try anything new. In an effort to encourage him where he is currently at , we are not going to revisit any exercise changes and continue to promote daily exercise into his routine which he has been good at doing. With the warmer weather he has been doing more outside and walking and this  reflects a progression in exercise time. Santo's main goal is to finish the program so he can continue working and get back to his routine at home.       Nutrition & Weight - Outcomes:     Pre Biometrics - 07/08/15 1419    Pre Biometrics   Height 5' 10.4" (1.788 m)   Weight 220 lb 8 oz (100.018 kg)   Waist Circumference 45 inches   Hip Circumference 44 inches   Waist to Hip Ratio 1.02 %   BMI (Calculated) 31.3       Nutrition:     Nutrition Therapy & Goals - 09/03/15 1057    Nutrition Therapy   Diet --  Patient was scheduled to meet with RD, but did not stay.      Nutrition Discharge:     Nutrition Assessments - 09/10/15 1033    Rate Your Plate Scores   Post Score 61   Post Score % 67 %      Education Questionnaire Score:     Knowledge Questionnaire Score - 09/10/15 1031    Knowledge Questionnaire Score   Post Score 24      Goals reviewed with patient; copy given to patient.

## 2015-09-15 ENCOUNTER — Encounter: Payer: Medicare Other | Admitting: *Deleted

## 2015-09-15 DIAGNOSIS — Z951 Presence of aortocoronary bypass graft: Secondary | ICD-10-CM

## 2015-09-15 NOTE — Progress Notes (Signed)
Daily Session Note  Patient Details  Name: [name] MRN: [id number] Date of Birth: 10/25/1940 Referring Provider:  Sparks, Jeffrey D, MD  Encounter Date: 09/15/2015  Check In:     Session Check In - 09/15/15 0824    Check-In   Location [facility]   Staff Present Susanne Bice, RN, BSN, CCRP;Steven Way, BS, ACSM EP-C, Exercise Physiologist;Renee MacMillan, MS, ACSM CEP, Exercise Physiologist   Supervising physician immediately available to respond to emergencies See telemetry face sheet for immediately available ER MD   Medication changes reported     No   Fall or balance concerns reported    No   Warm-up and Cool-down Performed on first and last piece of equipment   Resistance Training Performed No   VAD Patient? No   Pain Assessment   Currently in Pain? No/denies         Goals Met:  Proper associated with RPD/PD & O2 Sat Exercise tolerated well No report of cardiac concerns or symptoms Strength training completed today  Goals Unmet:  Not Applicable  Comments:    Dr. Mark Miller is Medical Director for [facility] and [facility]. 

## 2015-09-17 ENCOUNTER — Encounter: Payer: Medicare Other | Admitting: *Deleted

## 2015-09-17 DIAGNOSIS — Z951 Presence of aortocoronary bypass graft: Secondary | ICD-10-CM | POA: Diagnosis not present

## 2015-09-17 NOTE — Progress Notes (Signed)
Discharge Summary  Patient Details  Name: [name] MRN: [id number] Date of Birth: 1940-12-05 Referring Provider:  Antonieta Iba, MD   Number of Visits: 29  Reason for Discharge:  Patient reached a stable level of exercise. Patient independent in their exercise.  Smoking History:  History  Smoking status  . Never Smoker   Smokeless tobacco  . Not on file    Comment: tobacco use- no    Diagnosis:  S/P CABG x 4  ADL UCSD:   Initial Exercise Prescription:     Initial Exercise Prescription - 07/08/15 1400    Date of Initial Exercise Prescription   Date 07/08/15   Treadmill   MPH 1.8   Grade 0   Minutes 10   Recumbant Bike   Level 2   RPM 40   Watts 20   Minutes 10   NuStep   Level 2   Watts 50   Minutes 10   Arm Ergometer   Level 1   Watts 10   Minutes 10   Recumbant Elliptical   Level 2   RPM 40   Watts 20   Minutes 10   REL-XR   Level 3   Watts 50   Minutes 10   T5 Nustep   Level 1   Watts 20   Minutes 10   Biostep-RELP   Level 2   Watts 45   Minutes 10   Prescription Details   Frequency (times per week) 3   Duration Progress to 30 minutes of continuous aerobic without signs/symptoms of physical distress   Intensity   THRR REST +  30   Ratings of Perceived Exertion 11-15   Perceived Dyspnea 2-4   Progression   Progression Continue progressive overload as per policy without signs/symptoms or physical distress.   Resistance Training   Training Prescription Yes   Weight 2   Reps 10-15      Discharge Exercise Prescription (Final Exercise Prescription Changes):     Exercise Prescription Changes - 09/17/15 0900    Exercise Review   Progression Yes   Response to Exercise   Blood Pressure (Admit) --   Blood Pressure (Exercise) --   Blood Pressure (Exit) --   Heart Rate (Admit) --   Heart Rate (Exercise) --   Heart Rate (Exit) --   Rating of Perceived Exertion (Exercise) --   Symptoms None   Comments Patient is  graduating from the program today and home exercise plans were discussed. Details of the patient's exercise prescription and what they need to do in order to continue the prescription and progress with exercise were outlined and the patient verbalized understanding. The patient plans to complete all exercise at home on his treadmill. He is very diligent about using it and verbalizes understanding of his exercise goals.   Frequency Add 3 additional days to program exercise sessions.  Maintains his home exercise routine   Duration Progress to 50 minutes of aerobic without signs/symptoms of physical distress   Intensity Rest + 30   Progression   Progression Continue progressive overload as per policy without signs/symptoms or physical distress.   Treadmill   MPH 3.6   Grade 6   Minutes 60   Home Exercise Plan   Plans to continue exercise at Home      Functional Capacity:     6 Minute Walk      07/08/15 1416       6 Minute Walk   Phase Initial  Distance 1090 feet     Walk Time 6 minutes     RPE 12     Perceived Dyspnea  2     Symptoms No     Resting HR 80 bpm     Resting BP 128/80 mmHg     Max Ex. HR 102 bpm     Max Ex. BP 128/78 mmHg        Psychological, QOL, Others - Outcomes: PHQ 2/9: [facility] 09/10/2015 07/08/2015  Decreased Interest 0 0  Down, Depressed, Hopeless 0 0  PHQ - 2 Score 0 0  Altered sleeping 0 0  Tired, decreased energy 0 3  Change in appetite 0 0  Feeling bad or failure about yourself  0 0  Trouble concentrating 0 0  Moving slowly or fidgety/restless 0 0  Suicidal thoughts 0 0  PHQ-9 Score 0 3  Difficult doing work/chores Not difficult at all Somewhat difficult    Quality of Life:     Quality of Life - 09/10/15 1041    Quality of Life Scores   Health/Function Post 24.53 %   Health/Function % Change 8 %   Socioeconomic Post 25.71 %   Socioeconomic % Change -1 %   Psych/Spiritual Post 26.79 %   Psych/Spiritual % Change 8 %    Family Post 28 %   Family % Change 17 %   GLOBAL Post 25.75 %   GLOBAL % Change 8 %      Personal Goals: Goals established at orientation with interventions provided to work toward goal.     Personal Goals and Risk Factors at Admission - 09/10/15 0942    Core Components/Risk Factors/Patient Goals on Admission   Hypertension Yes   Intervention Provide education on lifestyle modifcations including regular physical activity/exercise, weight management, moderate sodium restriction and increased consumption of fresh fruit, vegetables, and low fat dairy, alcohol moderation, and smoking cessation.;Monitor prescription use compliance.   Expected Outcomes Short Term: Continued assessment and intervention until BP is < 140/4190mm HG in hypertensive participants. < 130/5480mm HG in hypertensive participants with diabetes, heart failure or chronic kidney disease.;Long Term: Maintenance of blood pressure at goal levels.   Lipids Yes   Intervention Provide education and support for participant on nutrition & aerobic/resistive exercise along with prescribed medications to achieve LDL 70mg , HDL >40mg .   Expected Outcomes Short Term: Participant states understanding of desired cholesterol values and is compliant with medications prescribed. Participant is following exercise prescription and nutrition guidelines.;Long Term: Cholesterol controlled with medications as prescribed, with individualized exercise RX and with personalized nutrition plan. Value goals: LDL < 70mg , HDL > 40 mg.       Personal Goals Discharge:     Goals and Risk Factor Review      07/16/15 0938 07/16/15 0941 07/27/15 0725 08/23/15 1454 09/17/15 1413   Core Components/Risk Factors/Patient Goals Review   Personal Goals Review Increase Aerobic Exercise and Physical Activity;Hypertension;Lipids;Stress    Sedentary;Stress;Lipids;Hypertension   Review     Chase PicketHerman said Cardiac REhab has helped him feel confident walking on the treadmill  since he has walked on the treadmill 4 miles a day at least 5 days a week. Hermans blood pressure has been very stable.    Expected Outcomes     See outcomes listed in the admission section. Goal was to graduate for him and return to work.    Increase Aerobic Exercise and Physical Activity (read-only)   Goals Progress/Improvement seen    Yes Yes  Comments Spoke with [name] today to infoem him that the Ex Phys will work with him on his exercise goals.  Alphonsa is very experienced with exercise and lives a very active lifestyle. He has a treadmill at home and weights that he uses on days that he doesn't come to class. He has a routine and doesn't seem to want to change it. We will work with him on progressig in his routine or trying new things in order to provide his body with additional stimulus for improvement. We will follow up in February on how this progressing is going.  Tiberius is non compliant in switching his exercise routine or making any changes to it. It is the same thing he has done for years and he says that he does not want to try anything new. In an effort to encourage him where he is currently at , we are not going to revisit any exercise changes and continue to promote daily exercise into his routine which he has been good at doing. With the warmer weather he has been doing more outside and walking and this  reflects a progression in exercise time. Demonie's main goal is to finish the program so he can continue working and get back to his routine at home.     Hypertension (read-only)   Goal Participant will see blood pressure controlled within the values of 140/77mm/Hg or within value directed by their physician.       Comments Reviewed the factors for controlling his risk factor: meds,exercise and nutrition. Will contnue to follow with [name] the steps he is completing to keep his risk factor under control. Correction name is not [name], but Venedy.      Abnormal Lipids (read-only)    Progress seen towards goals Unknown       Comments Reviewed the factors for controlling his risk factor: meds,exercise and nutrition. Will contnue to follow with [name] the steps he is completing to keep his risk factor under control. Correction name is not [name], but Cyril.      Stress (read-only)   Comments Reviewed the factors for controlling his risk factor: meds,exercise and nutrition. Mental health counselor will talk to [name] soon for further advice. Will contnue to follow with [name] the steps he is completing to keep his risk factor under control. Correction name is not [name], but Ranshaw.         Nutrition & Weight - Outcomes:     Pre Biometrics - 07/08/15 1419    Pre Biometrics   Height 5' 10.4" (1.788 m)   Weight 220 lb 8 oz (100.018 kg)   Waist Circumference 45 inches   Hip Circumference 44 inches   Waist to Hip Ratio 1.02 %   BMI (Calculated) 31.3       Nutrition:     Nutrition Therapy & Goals - 09/03/15 1057    Nutrition Therapy   Diet --  Patient was scheduled to meet with RD, but did not stay.      Nutrition Discharge:     Nutrition Assessments - 09/10/15 1033    Rate Your Plate Scores   Post Score 61   Post Score % 67 %      Education Questionnaire Score:     Knowledge Questionnaire Score - 09/10/15 1031    Knowledge Questionnaire Score   Post Score 24      Goals reviewed with patient; copy given to patient.

## 2015-09-17 NOTE — Addendum Note (Signed)
Addended by: Virgina OrganENTERKIN, Jasn Xia on: 09/17/2015 02:18 PM   Modules accepted: Orders

## 2015-09-17 NOTE — Progress Notes (Signed)
Daily Session Note  Patient Details  Name: [name] MRN: [id number] Date of Birth: 12/10/1940 Referring Provider:  Gollan, Timothy J, MD  Encounter Date: 09/17/2015  Check In:     Session Check In - 09/17/15 0913    Check-In   Location [facility]   Staff Present Renee MacMillan, MS, ACSM CEP, Exercise Physiologist;Carroll Enterkin, RN, BSN;Stacey Joyce, RRT, RCP, Respiratory Therapist   Supervising physician immediately available to respond to emergencies See telemetry face sheet for immediately available ER MD   Medication changes reported     No   Fall or balance concerns reported    No   Warm-up and Cool-down Performed on first and last piece of equipment   Resistance Training Performed No   VAD Patient? No   Pain Assessment   Currently in Pain? No/denies   Multiple Pain Sites No         Goals Met:  Independence with exercise equipment Exercise tolerated well No report of cardiac concerns or symptoms Strength training completed today  Goals Unmet:  Not Applicable  Comments: Patient completed exercise prescription and all exercise goals during rehab session. The exercise was tolerated well and the patient is progressing in the program.   Dr. Mark Miller is Medical Director for [facility] and [facility]. 

## 2015-09-17 NOTE — Progress Notes (Signed)
Cardiac Individual Treatment Plan  Patient Details  Name: ROMARIO TITH MRN: [id number] Date of Birth: 03-22-1941 Referring Provider:  Minna Merritts, MD  Initial Encounter Date:    Visit Diagnosis: S/P CABG x 4  Patient's Home Medications on Admission:  Current outpatient prescriptions:  .  aspirin 81 MG tablet, Take 81 mg by mouth daily.  , Disp: , Rfl:  .  clopidogrel (PLAVIX) 75 MG tablet, TAKE 1 TABLET EVERY DAY, Disp: 90 tablet, Rfl: 3 .  diazepam (VALIUM) 5 MG tablet, Take 1 tablet (5 mg total) by mouth every 6 (six) hours as needed for anxiety., Disp: 30 tablet, Rfl: 0 .  fexofenadine (ALLEGRA) 180 MG tablet, Take 180 mg by mouth 2 (two) times a week., Disp: , Rfl:  .  lisinopril (PRINIVIL,ZESTRIL) 2.5 MG tablet, TAKE 1 TABLET BY MOUTH EVERY DAY, Disp: 30 tablet, Rfl: 3 .  metoprolol tartrate (LOPRESSOR) 25 MG tablet, TAKE 1/2 TABLET BY MOUTH 2 TIMES A DAY, Disp: 30 tablet, Rfl: 3 .  Multiple Vitamins-Minerals (CENTRUM PO), Take by mouth daily.  , Disp: , Rfl:  .  Omega-3 Fatty Acids (FISH OIL) 1000 MG CAPS, Take 1,000 mg by mouth 2 (two) times daily., Disp: , Rfl:  .  simvastatin (ZOCOR) 40 MG tablet, TAKE 1 TABLET EVERY DAY, Disp: 90 tablet, Rfl: 3  Past Medical History: Past Medical History  Diagnosis Date  . CAD (coronary artery disease)     a. s/p DES to LAD and RCA (2001/2005);  b. 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  Marland Kitchen Hyperlipidemia, mixed   . Essential hypertension   . ED (erectile dysfunction) of non-organic origin   . History of pneumonia   . Kidney stones   . Arthritis   . Leg cramps   . Post-operative Atrial Fibrillation     Tobacco Use: History  Smoking status  . Never Smoker   Smokeless tobacco  . Not on file    Comment: tobacco use- no    Labs: Recent Review Flowsheet Data    Labs for ITP Cardiac and Pulmonary Rehab Latest Ref Rng 05/10/2015 05/10/2015 05/10/2015 05/11/2015 05/11/2015   PHART 7.350 - 7.450 7.380  7.340(L) - 7.388 -   PCO2ART 35.0 - 45.0 mmHg 42.0 44.9 - 39.4 -   HCO3 20.0 - 24.0 mEq/L 24.7(H) 24.0 - 23.6 -   TCO2 0 - 100 mmol/L _0 ACIDBASEDEF 0.0 - 2.0 mmol/L - 2.0 - 1.0 -   O2SAT - 97.0 95.0 - 91.0 -       Exercise Target Goals:    Exercise Program Goal: Individual exercise prescription set with THRR, safety & activity barriers. Participant demonstrates ability to understand and report RPE using BORG scale, to self-measure pulse accurately, and to acknowledge the importance of the exercise prescription.  Exercise Prescription Goal: Starting with aerobic activity 30 plus minutes a day, 3 days per week for initial exercise prescription. Provide home exercise prescription and guidelines that participant acknowledges understanding prior to discharge.  Activity Barriers & Risk Stratification:     Activity Barriers & Cardiac Risk Stratification - 07/08/15 1453    Activity Barriers & Cardiac Risk Stratification   Activity Barriers Other (comment)  Back problems      6 Minute Walk:     6 Minute Walk      07/08/15 1416       6 Minute Walk   Phase Initial     Distance 1090 feet  Walk Time 6 minutes     RPE 12     Perceived Dyspnea  2     Symptoms No     Resting HR 80 bpm     Resting BP 128/80 mmHg     Max Ex. HR 102 bpm     Max Ex. BP 128/78 mmHg        Initial Exercise Prescription:     Initial Exercise Prescription - 07/08/15 1400    Date of Initial Exercise Prescription   Date 07/08/15   Treadmill   MPH 1.8   Grade 0   Minutes 10   Recumbant Bike   Level 2   RPM 40   Watts 20   Minutes 10   NuStep   Level 2   Watts 50   Minutes 10   Arm Ergometer   Level 1   Watts 10   Minutes 10   Recumbant Elliptical   Level 2   RPM 40   Watts 20   Minutes 10   REL-XR   Level 3   Watts 50   Minutes 10   T5 Nustep   Level 1   Watts 20   Minutes 10   Biostep-RELP   Level 2   Watts 45   Minutes 10   Prescription Details    Frequency (times per week) 3   Duration Progress to 30 minutes of continuous aerobic without signs/symptoms of physical distress   Intensity   THRR REST +  30   Ratings of Perceived Exertion 11-15   Perceived Dyspnea 2-4   Progression   Progression Continue progressive overload as per policy without signs/symptoms or physical distress.   Resistance Training   Training Prescription Yes   Weight 2   Reps 10-15      Exercise Prescription Changes:     Exercise Prescription Changes      07/16/15 1100 07/26/15 0723 08/04/15 0800 08/18/15 0900 08/20/15 0900   Exercise Review   Progression Yes Yes Yes     Response to Exercise   Blood Pressure (Admit)  150/90 mmHg      Blood Pressure (Exercise)  144/84 mmHg      Blood Pressure (Exit)  116/64 mmHg      Heart Rate (Admit)  75 bpm      Heart Rate (Exercise)  101 bpm      Heart Rate (Exit)  84 bpm      Rating of Perceived Exertion (Exercise)  11      Symptoms _0    Comments Today was [name]'s second day of exercise.Reviewed orientation to the gym and the equipment functions and settings. Procedures and policies of the gym were outlined and explained. The patient's individual exercise prescription and treatment plan were reviewed with them. All starting workloads were established based on the results of the functional testing  done at the initial intake visit. The plan for exercise progression was also introduced and progression will be customized based on the patient's performance and goals. [name] has a long history of being physically active and can exercise safely for the full class time. He is also exercising at home and this has been approved by his MD. He mostly walks at home on the treadmill. He does not use heavy weights at home at this time due to MD's orders. He is allowed to lift anything under 15 lbs. We will continually progress him in the program using intensity.  Reviewed individualized exercise prescription and  made increases per departmental policy. Exercise increases were discussed with the patient and they were able to perform the new work loads without issue (no signs or symptoms).   Education only today, no exercise. No charge. Education only today, no exercise. No charge.   Frequency Add 1 additional day to program exercise sessions. Add 3 additional days to program exercise sessions.  Jonan walks and lifts weights at home Add 3 additional days to program exercise sessions.  Jewelz walks and lifts weights at home Add 3 additional days to program exercise sessions.  Diante walks and lifts weights at home Add 3 additional days to program exercise sessions.  Fredie walks and lifts weights at home   Duration Progress to 50 minutes of aerobic without signs/symptoms of physical distress Progress to 50 minutes of aerobic without signs/symptoms of physical distress Progress to 50 minutes of aerobic without signs/symptoms of physical distress Progress to 50 minutes of aerobic without signs/symptoms of physical distress Progress to 50 minutes of aerobic without signs/symptoms of physical distress   Intensity Rest + 30 Rest + 30 Rest + 30 Rest + 30 Rest + 30   Progression   Progression Continue progressive overload as per policy without signs/symptoms or physical distress. Continue progressive overload as per policy without signs/symptoms or physical distress. Continue progressive overload as per policy without signs/symptoms or physical distress. Continue progressive overload as per policy without signs/symptoms or physical distress. Continue progressive overload as per policy without signs/symptoms or physical distress.   Resistance Training   Training Prescription (read-only) Yes Yes Yes Yes Yes   Weight (read-only) '10 10 10 10 10   '$ Reps (read-only) 10-15 10-15 10-15 10-15 10-15   Treadmill   MPH (read-only) 3.6 3.6 3.6 3.6 3.6   Grade (read-only) '6 6 6 6 6   '$ Minutes (read-only) 45 45 60 60 60     08/23/15  1400 09/03/15 0900 09/17/15 0900       Exercise Review   Progression Yes Yes Yes     Response to Exercise   Blood Pressure (Admit) 122/74 mmHg  --     Blood Pressure (Exercise) 142/70 mmHg  --     Blood Pressure (Exit) 124/70 mmHg  --     Heart Rate (Admit) 76 bpm  --     Heart Rate (Exercise) 104 bpm  --     Heart Rate (Exit) 96 bpm  --     Rating of Perceived Exertion (Exercise) 12  --     Symptoms None  None     Comments Laymond is very adamant about maintaining the same exercise routine in class. He is non compliant in trying anything different. With the warmer weather he has increaed his walking outside and has therefore progressed in time with his home exercise routine.   Patient is graduating from the program today and home exercise plans were discussed. Details of the patient's exercise prescription and what they need to do in order to continue the prescription and progress with exercise were outlined and the patient verbalized understanding. The patient plans to complete all exercise at home on his treadmill. He is very diligent about using it and verbalizes understanding of his exercise goals.     Frequency Add 3 additional days to program exercise sessions.  Maintains his home exercise routine --  Maintains his home exercise routine Add 3 additional days to program exercise sessions.  Maintains his home exercise routine     Duration Progress to 50 minutes of  aerobic without signs/symptoms of physical distress  Progress to 50 minutes of aerobic without signs/symptoms of physical distress     Intensity Rest + 30  Rest + 30     Progression   Progression Continue progressive overload as per policy without signs/symptoms or physical distress.  Continue progressive overload as per policy without signs/symptoms or physical distress.     Resistance Training   Training Prescription (read-only) Yes Yes      Weight (read-only) 10 10      Reps (read-only) 10-15 10-15      Treadmill   MPH    3.6     Grade   6     Minutes   42     MPH (read-only) 3.6 3.6      Grade (read-only) 6 6      Minutes (read-only) 60 60      Home Exercise Plan   Plans to continue exercise at   Home        Discharge Exercise Prescription (Final Exercise Prescription Changes):     Exercise Prescription Changes - 09/17/15 0900    Exercise Review   Progression Yes   Response to Exercise   Blood Pressure (Admit) --   Blood Pressure (Exercise) --   Blood Pressure (Exit) --   Heart Rate (Admit) --   Heart Rate (Exercise) --   Heart Rate (Exit) --   Rating of Perceived Exertion (Exercise) --   Symptoms None   Comments Patient is graduating from the program today and home exercise plans were discussed. Details of the patient's exercise prescription and what they need to do in order to continue the prescription and progress with exercise were outlined and the patient verbalized understanding. The patient plans to complete all exercise at home on his treadmill. He is very diligent about using it and verbalizes understanding of his exercise goals.   Frequency Add 3 additional days to program exercise sessions.  Maintains his home exercise routine   Duration Progress to 50 minutes of aerobic without signs/symptoms of physical distress   Intensity Rest + 30   Progression   Progression Continue progressive overload as per policy without signs/symptoms or physical distress.   Treadmill   MPH 3.6   Grade 6   Minutes 60   Home Exercise Plan   Plans to continue exercise at Home      Nutrition:  Target Goals: Understanding of nutrition guidelines, daily intake of sodium '1500mg'$ , cholesterol '200mg'$ , calories 30% from fat and 7% or less from saturated fats, daily to have 5 or more servings of fruits and vegetables.  Biometrics:     Pre Biometrics - 07/08/15 1419    Pre Biometrics   Height 5' 10.4" (1.788 m)   Weight 220 lb 8 oz (100.018 kg)   Waist Circumference 45 inches   Hip Circumference 44  inches   Waist to Hip Ratio 1.02 %   BMI (Calculated) 31.3       Nutrition Therapy Plan and Nutrition Goals:     Nutrition Therapy & Goals - 09/03/15 1057    Nutrition Therapy   Diet --  Patient was scheduled to meet with RD, but did not stay.      Nutrition Discharge: Rate Your Plate Scores:     Nutrition Assessments - 09/10/15 1033    Rate Your Plate Scores   Post Score 61   Post Score % 67 %      Nutrition Goals Re-Evaluation:     Nutrition  Goals Re-Evaluation      09/17/15 1412           Personal Goal #1 Re-Evaluation   Goal Progress Seen Yes       Comments Chase said he tries to eat healthy like having a vegetable at lunch but it is hard since his wife passed away who used to cook for him.           Psychosocial: Target Goals: Acknowledge presence or absence of depression, maximize coping skills, provide positive support system. Participant is able to verbalize types and ability to use techniques and skills needed for reducing stress and depression.  Initial Review & Psychosocial Screening:     Initial Psych Review & Screening - 07/08/15 1500    Family Dynamics   Good Support System? Yes  Daeshawn's wife died 31 years ago but his daughter lives nearby.    Barriers   Psychosocial barriers to participate in program There are no identifiable barriers or psychosocial needs.   Screening Interventions   Interventions Encouraged to exercise      Quality of Life Scores:     Quality of Life - 09/10/15 1041    Quality of Life Scores   Health/Function Post 24.53 %   Health/Function % Change 8 %   Socioeconomic Post 25.71 %   Socioeconomic % Change -1 %   Psych/Spiritual Post 26.79 %   Psych/Spiritual % Change 8 %   Family Post 28 %   Family % Change 17 %   GLOBAL Post 25.75 %   GLOBAL % Change 8 %      PHQ-9:     Recent Review Flowsheet Data    Depression screen [facility] 2/9 09/10/2015 07/08/2015   Decreased Interest 0 0   Down, Depressed, Hopeless  0 0   PHQ - 2 Score 0 0   Altered sleeping 0 0   Tired, decreased energy 0 3   Change in appetite 0 0   Feeling bad or failure about yourself  0 0   Trouble concentrating 0 0   Moving slowly or fidgety/restless 0 0   Suicidal thoughts 0 0   PHQ-9 Score 0 3   Difficult doing work/chores Not difficult at all Somewhat difficult      Psychosocial Evaluation and Intervention:     Psychosocial Evaluation - 08/02/15 1010    Psychosocial Evaluation & Interventions   Interventions Encouraged to exercise with the program and follow exercise prescription   Comments Counselor met with Mr. Sedeno today for his initial psychosocial evaluation.  He is a 75 year old who had quadruple bypass surgery on 05/10/15.  He has a strong support system with (2) adult daughters who live close by and check on him frequently, as well as active involvement in his local faith community.  Mr. Reaume reports sleeping well and having a good appetite.  He denies a history of depression or anxiety or current symtpoms.  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  Mr. Bloom has been walking on the treadmill for years consistently and his goal for this program is to increase his distance and speed while being monitored, so he can return to doing it on his own.  Counselor encouraged Mr. Mclaren to pace himself during his recovery time.     Continued Psychosocial Services Needed Yes  Mr. Vest will  benefit from the stress management educational component of this program in particular.        Psychosocial Re-Evaluation:     Psychosocial Re-Evaluation      08/20/15 0904 09/17/15 1415         Psychosocial Re-Evaluation   Comments Octavio said he is going to work this weekend. 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  Davied  stated today that Cardiac REhab has really helped him.          Vocational Rehabilitation: Provide vocational rehab assistance to qualifying candidates.   Vocational Rehab Evaluation & Intervention:     Vocational Rehab - 07/08/15 1455    Initial Vocational Rehab Evaluation & Intervention   Assessment shows need for Vocational Rehabilitation No      Education: Education Goals: Education classes will be provided on a weekly basis, covering required topics. Participant will state understanding/return demonstration of topics presented.  Learning Barriers/Preferences:     Learning Barriers/Preferences - 07/08/15 1454    Learning Barriers/Preferences   Learning Barriers None   Learning Preferences Written Material      Education Topics: General Nutrition Guidelines/Fats and Fiber: -Group instruction provided by verbal, written material, models and posters to present the general guidelines for heart healthy nutrition. Gives an explanation and review of dietary fats and fiber.   Controlling Sodium/Reading Food Labels: -Group verbal and written material supporting the discussion of sodium use in heart healthy nutrition. Review and explanation with models, verbal and written materials for utilization of the food label.          Cardiac Rehab from 09/15/2015 in [facility] Cardiac and Pulmonary Rehab   Date  09/13/15   Educator  [facility]   Instruction Review Code  2- meets goals/outcomes      Exercise Physiology & Risk Factors: - Group verbal and written instruction with models to review the exercise physiology of the cardiovascular system and associated critical values. Details cardiovascular disease risk factors and the goals associated with each risk factor.      Cardiac Rehab from 09/15/2015 in [facility] Cardiac and Pulmonary Rehab   Date  09/06/15   Educator  RM   Instruction Review Code  2- meets goals/outcomes      Aerobic Exercise & Resistance Training: - Gives group verbal and written  discussion on the health impact of inactivity. On the components of aerobic and resistive training programs and the benefits of this training and how to safely progress through these programs.      Cardiac Rehab from 09/15/2015 in [facility] Cardiac and Pulmonary Rehab   Date  09/08/15   Educator  SW   Instruction Review Code  2- meets goals/outcomes      Flexibility, Balance, General Exercise Guidelines: - Provides group verbal and written instruction on the benefits of flexibility and balance training programs. Provides general exercise guidelines with specific guidelines to those with heart or lung disease. Demonstration and skill practice provided.      Cardiac Rehab from 09/15/2015 in [facility] Cardiac and Pulmonary Rehab   Date  07/26/15   Educator  RM   Instruction Review Code  2- meets goals/outcomes      Stress Management: - Provides group verbal and written instruction about the health risks of elevated stress, cause of high stress, and healthy ways to reduce stress.      Cardiac Rehab from 09/15/2015 in [facility]  Cardiac and Pulmonary Rehab   Date  09/15/15   Educator  [facility]   Instruction Review Code  2- meets goals/outcomes      Depression: - Provides group verbal and written instruction on the correlation between heart/lung disease and depressed mood, treatment options, and the stigmas associated with seeking treatment.      Cardiac Rehab from 09/15/2015 in [facility] Cardiac and Pulmonary Rehab   Date  09/01/15   Educator  [facility]   Instruction Review Code  2- meets goals/outcomes      Anatomy & Physiology of the Heart: - Group verbal and written instruction and models provide basic cardiac anatomy and physiology, with the coronary electrical and arterial systems. Review of: AMI, Angina, Valve disease, Heart Failure, Cardiac Arrhythmia, Pacemakers, and the ICD.      Cardiac Rehab from 09/15/2015 in [facility] Cardiac and Pulmonary Rehab   Date  08/02/15   Educator  SB   Instruction Review Code  2- meets  goals/outcomes      Cardiac Procedures: - Group verbal and written instruction and models to describe the testing methods done to diagnose heart disease. Reviews the outcomes of the test results. Describes the treatment choices: Medical Management, Angioplasty, or Coronary Bypass Surgery.   Cardiac Medications: - Group verbal and written instruction to review commonly prescribed medications for heart disease. Reviews the medication, class of the drug, and side effects. Includes the steps to properly store meds and maintain the prescription regimen.      Cardiac Rehab from 09/15/2015 in [facility] Cardiac and Pulmonary Rehab   Date  08/18/15   Educator  SB   Instruction Review Code  2- meets goals/outcomes      Go Sex-Intimacy & Heart Disease, Get SMART - Goal Setting: - Group verbal and written instruction through game format to discuss heart disease and the return to sexual intimacy. Provides group verbal and written material to discuss and apply goal setting through the application of the S.M.A.R.T. Method.   Other Matters of the Heart: - Provides group verbal, written materials and models to describe Heart Failure, Angina, Valve Disease, and Diabetes in the realm of heart disease. Includes description of the disease process and treatment options available to the cardiac patient.      Cardiac Rehab from 09/15/2015 in [facility] Cardiac and Pulmonary Rehab   Date  08/09/15   Educator  SB   Instruction Review Code  2- meets goals/outcomes      Exercise & Equipment Safety: - Individual verbal instruction and demonstration of equipment use and safety with use of the equipment.      Cardiac Rehab from 09/15/2015 in [facility] Cardiac and Pulmonary Rehab   Date  07/08/15   Educator  C. EnterkinRN   Instruction Review Code  1- partially meets, needs review/practice      Infection Prevention: - Provides verbal and written material to individual with discussion of infection control including proper hand  washing and proper equipment cleaning during exercise session.      Cardiac Rehab from 09/15/2015 in [facility] Cardiac and Pulmonary Rehab   Date  07/08/15   Educator  C. Sanay Belmar,RN   Instruction Review Code  2- meets goals/outcomes      Falls Prevention: - Provides verbal and written material to individual with discussion of falls prevention and safety.      Cardiac Rehab from 09/15/2015 in [facility] Cardiac and Pulmonary Rehab   Date  07/08/15   Educator  C. Gatlin Kittell,RN   Instruction Review Code  2- meets goals/outcomes      Diabetes: - Individual verbal and written instruction to review signs/symptoms of diabetes, desired ranges of glucose level fasting, after meals and with exercise. Advice that pre and post exercise glucose checks will be done for 3 sessions at entry of program.    Knowledge Questionnaire Score:     Knowledge Questionnaire Score - 09/10/15 1031    Knowledge Questionnaire Score   Post Score 24      Personal Goals and Risk Factors at Admission:     Personal Goals and Risk Factors at Admission - 09/10/15 0942    Core Components/Risk Factors/Patient Goals on Admission   Hypertension Yes   Intervention Provide education on lifestyle modifcations including regular physical activity/exercise, weight management, moderate sodium restriction and increased consumption of fresh fruit, vegetables, and low fat dairy, alcohol moderation, and smoking cessation.;Monitor prescription use compliance.   Expected Outcomes Short Term: Continued assessment and intervention until BP is < 140/68m HG in hypertensive participants. < 130/814mHG in hypertensive participants with diabetes, heart failure or chronic kidney disease.;Long Term: Maintenance of blood pressure at goal levels.   Lipids Yes   Intervention Provide education and support for participant on nutrition & aerobic/resistive exercise along with prescribed medications to achieve LDL '70mg'$ , HDL >'40mg'$ .   Expected Outcomes Short  Term: Participant states understanding of desired cholesterol values and is compliant with medications prescribed. Participant is following exercise prescription and nutrition guidelines.;Long Term: Cholesterol controlled with medications as prescribed, with individualized exercise RX and with personalized nutrition plan. Value goals: LDL < '70mg'$ , HDL > 40 mg.      Personal Goals and Risk Factors Review:      Goals and Risk Factor Review      07/16/15 0938 07/16/15 0941 07/27/15 0725 08/23/15 1454 09/17/15 1413   Core Components/Risk Factors/Patient Goals Review   Personal Goals Review Increase Aerobic Exercise and Physical Activity;Hypertension;Lipids;Stress    Sedentary;Stress;Lipids;Hypertension   Review     [facility] has helped him feel confident walking on the treadmill since he has walked on the treadmill 4 miles a day at least 5 days a week. Hermans blood pressure has been very stable.    Expected Outcomes     See outcomes listed in the admission section. Goal was to graduate for him and return to work.    Increase Aerobic Exercise and Physical Activity (read-only)   Goals Progress/Improvement seen    Yes Yes    Comments Spoke with RuJoneen Carawayoday to infoem him that the Ex Phys will work with him on his exercise goals.  HeSamads very experienced with exercise and lives a very active lifestyle. He has a treadmill at home and weights that he uses on days that he doesn't come to class. He has a routine and doesn't seem to want to change it. We will work with him on progressig in his routine or trying new things in order to provide his body with additional stimulus for improvement. We will follow up in February on how this progressing is going.  HeSamirs non compliant in switching his exercise routine or making any changes to it. It is the same thing he has done for years and he says that he does not want to try anything new. In an effort to encourage him where he is currently at ,  we are not going to revisit any exercise changes and continue to promote daily exercise into his routine which he has been good at  doing. With the warmer weather he has been doing more outside and walking and this  reflects a progression in exercise time. Conall's main goal is to finish the program so he can continue working and get back to his routine at home.     Hypertension (read-only)   Goal Participant will see blood pressure controlled within the values of 140/34m/Hg or within value directed by their physician.       Comments Reviewed the factors for controlling his risk factor: meds,exercise and nutrition. Will contnue to follow with RJoneen Carawaythe steps he is completing to keep his risk factor under control. Correction name is not RJoneen Caraway but HTaft      Abnormal Lipids (read-only)   Progress seen towards goals Unknown       Comments Reviewed the factors for controlling his risk factor: meds,exercise and nutrition. Will contnue to follow with RJoneen Carawaythe steps he is completing to keep his risk factor under control. Correction name is not RJoneen Caraway but HMarianna      Stress (read-only)   Comments Reviewed the factors for controlling his risk factor: meds,exercise and nutrition. Mental health counselor will talk to RJoneen Carawaysoon for further advice. Will contnue to follow with RJoneen Carawaythe steps he is completing to keep his risk factor under control. Correction name is not RJoneen Caraway but [facility]         Personal Goals Discharge (Final Personal Goals and Risk Factors Review):      Goals and Risk Factor Review - 09/17/15 1413    Core Components/Risk Factors/Patient Goals Review   Personal Goals Review Sedentary;Stress;Lipids;Hypertension   Review [facility] has helped him feel confident walking on the treadmill since he has walked on the treadmill 4 miles a day at least 5 days a week. Hermans blood pressure has been very stable.    Expected Outcomes See outcomes listed in the admission  section. Goal was to graduate for him and return to work.       ITP Comments:     ITP Comments      07/11/15 1217 08/05/15 1155 08/27/15 0854 08/31/15 0748 09/10/15 0941   ITP Comments Ready for 30 day review.  Continue with ITP  Has attended orientation, will start sessions first week of Jan. Ready for 30 day review. Continue with ITP. HThailikes to exercise on the treadmill.  30 day review.  Continue with ITP HTravelleis meeting individually with the registered dieticians.      Comments: HNahshoncompleted 36 sessions.

## 2015-09-28 ENCOUNTER — Encounter: Payer: Self-pay | Admitting: Cardiovascular Disease

## 2015-09-28 ENCOUNTER — Ambulatory Visit (INDEPENDENT_AMBULATORY_CARE_PROVIDER_SITE_OTHER): Payer: Medicare Other | Admitting: Cardiovascular Disease

## 2015-09-28 VITALS — BP 123/81 | HR 84 | Ht 72.0 in | Wt 219.5 lb

## 2015-09-28 DIAGNOSIS — R05 Cough: Secondary | ICD-10-CM | POA: Diagnosis not present

## 2015-09-28 DIAGNOSIS — I25119 Atherosclerotic heart disease of native coronary artery with unspecified angina pectoris: Secondary | ICD-10-CM

## 2015-09-28 DIAGNOSIS — Z951 Presence of aortocoronary bypass graft: Secondary | ICD-10-CM | POA: Diagnosis not present

## 2015-09-28 DIAGNOSIS — R059 Cough, unspecified: Secondary | ICD-10-CM | POA: Insufficient documentation

## 2015-09-28 DIAGNOSIS — E785 Hyperlipidemia, unspecified: Secondary | ICD-10-CM

## 2015-09-28 MED ORDER — LOSARTAN POTASSIUM 25 MG PO TABS: 25.0000 mg | ORAL_TABLET | Freq: Every day | ORAL | Status: DC

## 2015-09-28 MED ORDER — FUROSEMIDE 20 MG PO TABS: 20.0000 mg | ORAL_TABLET | Freq: Every day | ORAL | Status: DC | PRN

## 2015-09-28 NOTE — Assessment & Plan Note (Signed)
Severe three-vessel coronary disease, recent bypass surgery 2016 Recovered well, still with cough, We'll hold the lisinopril, start losartan 25 mg daily

## 2015-09-28 NOTE — Assessment & Plan Note (Signed)
Reports he recovered well from his surgery Notes reviewed in detail, he did have postoperative atrial fibrillation though denies any recent symptoms Exercising on a regular basis Medication changes as above   Total encounter time more than 25 minutes  Greater than 50% was spent in counseling and coordination of care with the patient

## 2015-09-28 NOTE — Patient Instructions (Addendum)
You are doing well.  For cough Please stop the lisinopril Start losartan one a day (low dose 25 mg once a day)  Please take lasix as needed for shortness of breath and cough with banana  Consider trying zyrtec /cetirazine for allergy Or allegra daily  Please call us if you have new issues that need to be addressed before your next appt.  Your physician wants you to follow-up in: 6 months.  You will receive a reminder letter in the mail two months in advance. If you don't receive a letter, please call our office to schedule the follow-up appointment.

## 2015-09-28 NOTE — Assessment & Plan Note (Signed)
Continue simvastatin, goal LDL <70

## 2015-09-28 NOTE — Assessment & Plan Note (Signed)
Encouraged him to stay on his statin, goal LDL less than 70

## 2015-09-28 NOTE — Progress Notes (Signed)
Patient ID: IBN STIEF, male    DOB: 07/19/1940, 75 y.o.   MRN: [id number]  HPI Comments: Mr. Asch is a very pleasant 75 year old male with a history of coronary artery disease, status post previous stenting of the LAD and right coronary artery with drug-eluting stents.   Catheterization in April 2005, which showed moderate diffuse nonobstructive disease. He presents for follow-up after recent bypass surgery x 4 at [facility] 05/2015. History of hypertension, hyperlipidemia, kidney stones  He had positive stress test (anterior wall ) at the end of 2016 (for chest pain and shortness of breath) leading to cardiac catheterization showing three-vessel disease, severe ostial LAD disease Sent to [facility] for a CABG He had postoperative atrial fibrillation Reports he has completed cardiac rehabilitation, now exercising regularly, walks 4 miles with no symptoms of shortness of breath or chest discomfort.  He does have a chronic cough, white clear sputum. Has cyst in the daytime, sometimes in the mornings. Tried Allegra, does not seem to be helping He did have upper respiratory infection at the end of 2016 after his surgery, was treated with prednisone and Z-Pak.  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 EF 54%.   severe diarrhea October 2015 and was put in the hospital for dehydration He developed the flu in February 2016   total cholesterol 137, LDL 79  Previously had problems with chronic dizziness which he attributes to in her ear problem.  he is working full-time job.      Allergies  Allergen Reactions  . Codeine     REACTION: Vomiting    Current Outpatient Prescriptions on File Prior to Visit  Medication Sig Dispense Refill  . aspirin 81 MG tablet Take 81 mg by mouth daily.      . clopidogrel (PLAVIX) 75 MG tablet TAKE 1  TABLET EVERY DAY 90 tablet 3  . diazepam (VALIUM) 5 MG tablet Take 1 tablet (5 mg total) by mouth every 6 (six) hours as needed for anxiety. 30 tablet 0  . fexofenadine (ALLEGRA) 180 MG tablet Take 180 mg by mouth 2 (two) times a week.    . metoprolol tartrate (LOPRESSOR) 25 MG tablet TAKE 1/2 TABLET BY MOUTH 2 TIMES A DAY 30 tablet 3  . Multiple Vitamins-Minerals (CENTRUM PO) Take by mouth daily.      . Omega-3 Fatty Acids (FISH OIL) 1000 MG CAPS Take 1,000 mg by mouth 2 (two) times daily.    . simvastatin (ZOCOR) 40 MG tablet TAKE 1 TABLET EVERY DAY 90 tablet 3   No current facility-administered medications on file prior to visit.    Past Medical History  Diagnosis Date  . CAD (coronary artery disease)     a. s/p DES to LAD and RCA (2001/2005);  b. 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  Marland Kitchen Hyperlipidemia, mixed   . Essential hypertension   . ED (erectile dysfunction) of non-organic origin   . History of pneumonia   . Kidney stones   . Arthritis   . Leg cramps   . Post-operative Atrial Fibrillation     Past Surgical History  Procedure Laterality Date  . Coronary angioplasty      3 stents  . Back surgery      2 lunbar surgeries  . Cardiac catheterization N/A 04/30/2015    Procedure: Left Heart Cath;  Surgeon:  Antonieta Ibaimothy J Gollan, MD;  Location: [facility] INVASIVE CV LAB;  Service: Cardiovascular;  Laterality: N/A;  . Retinal detachment surgery Left   . Tee without cardioversion N/A 05/10/2015    Procedure: TRANSESOPHAGEAL ECHOCARDIOGRAM (TEE);  Surgeon: Kerin PernaPeter Van Trigt, MD;  [facility];  Service: Open Heart Surgery;  Laterality: N/A;  . Coronary artery bypass graft N/A 05/10/2015    Procedure: CORONARY ARTERY BYPASS GRAFTING (CABG), ON PUMP, TIMES FOUR, USING LEFT INTERNAL MAMMARY ARTERY, BILATERAL GREATER SAPHENOUS VEINS HARVESTED ENDOSCOPICALLY, WITH CORONARY ENDARTERECTOMY;  Surgeon: Kerin PernaPeter Van Trigt, MD;  Location: [facility] OR;  Service: Open Heart Surgery;   Laterality: N/A;    Social History  reports that he has never smoked. He does not have any smokeless tobacco history on file. He reports that he does not drink alcohol or use illicit drugs.  Family History Family history is unknown by patient.   Review of Systems  Constitutional: Negative.   Respiratory: Positive for cough.   Cardiovascular: Negative.   Gastrointestinal: Negative.   Musculoskeletal: Negative.   Neurological: Negative.   Hematological: Negative.   Psychiatric/Behavioral: Negative.   All other systems reviewed and are negative.   BP 123/81 mmHg  Pulse 84  Ht 6' (1.829 m)  Wt 219 lb 8 oz (99.565 kg)  BMI 29.76 kg/m2  Physical Exam  Constitutional: He is oriented to person, place, and time. He appears well-developed and well-nourished.  HENT:  Head: Normocephalic.  Nose: Nose normal.  Mouth/Throat: Oropharynx is clear and moist.  Eyes: Conjunctivae are normal. Pupils are equal, round, and reactive to light.  Neck: Normal range of motion. Neck supple. No JVD present.  Cardiovascular: Normal rate, regular rhythm, S1 normal, S2 normal, normal heart sounds and intact distal pulses.  Exam reveals no gallop and no friction rub.   No murmur heard. Pulmonary/Chest: Effort normal. No respiratory distress. He has no wheezes. He has rales. He exhibits no tenderness.  Rales at the bases bilaterally  Abdominal: Soft. Bowel sounds are normal. He exhibits no distension. There is no tenderness.  Musculoskeletal: Normal range of motion. He exhibits no edema or tenderness.  Lymphadenopathy:    He has no cervical adenopathy.  Neurological: He is alert and oriented to person, place, and time. Coordination normal.  Skin: Skin is warm and dry. No rash noted. No erythema.  Psychiatric: He has a normal mood and affect. His behavior is normal. Judgment and thought content normal.      Assessment and Plan   Nursing note and vitals reviewed.

## 2015-09-28 NOTE — Assessment & Plan Note (Signed)
Rales at the bases noted in 2016 prior to bypass surgery Cough over the past several months Recommended he try different allergy medication, will stop ACE inhibitor, start losartan, will try Lasix sparingly If no improvement in his cough  symptoms, will refer to pulmonary ([place]) He did have a previous CT scan showing interstitial findings . Clinical Exam is currently abnormal

## 2015-10-21 ENCOUNTER — Telehealth: Payer: Self-pay | Admitting: Cardiovascular Disease

## 2015-10-21 NOTE — Telephone Encounter (Signed)
Pt c/o medication issue:  1. Name of Medication: Lasix (Losartan)   2. How are you currently taking this medication (dosage and times per day)? Morning   3. Are you having a reaction (difficulty breathing--STAT)? no  4. What is your medication issue? He has small spells of not feeling well, and then later on he will be okay.  Not sure if there is something we need to do. Or if this is normal. At breakfast today when he got dizzy he was nauseated. Pt daughter (on phone) took him home for she was worried about this.  He has already vomited this morning already.  Please advise

## 2015-10-21 NOTE — Telephone Encounter (Signed)
Spoke w/ Patty. Pt was advised at ov 09/28/15:  For cough Please stop the lisinopril Start losartan one a day (low dose 25 mg once a day)  She reports that wokw up this am and does not feel good.   He felt dizzy, went to take a shower and felt worse after getting out.  Pt has been nauseous and vomited once this am.  She is concerned, as "we are not a family that gets nauseous". BP  8:45 am 158/80 9:30 am 161/78 10:45 am 160/82 Pt went home and took a 10 mg valium, feels better after lying down. She asks that I make Dr. Mariah MillingGollan aware, as sx are unusual for pt and he does not want to be unable to work today. She believes sx are r/t losartan and would like to know if he should resume lisinopril or change to another med.

## 2015-10-22 NOTE — Telephone Encounter (Signed)
He could hold the losartan for now, through the weekend, it is only a very small dose Unclear if that is causing his symptoms, Typically losartan does not cause GI issues or nausea We should touch base next week, see what his blood pressure is doing, see how he feels

## 2015-10-22 NOTE — Telephone Encounter (Signed)
S/w pt daughter, Alexia Freestoneatty, who reports pt took 2mg  diazepam yesterday evening and is feeling better. Advised daughter of Dr. Windell HummingbirdGollan's recommendations.  Patty verbalized understanding and is agreeable w/plan. They will call next week with an update on pt condition.

## 2016-01-31 ENCOUNTER — Other Ambulatory Visit: Payer: Self-pay

## 2016-01-31 MED ORDER — FUROSEMIDE 20 MG PO TABS: 20.0000 mg | ORAL_TABLET | Freq: Every day | ORAL | 3 refills | Status: DC | PRN

## 2016-02-21 IMAGING — CR DG CHEST 1V PORT
1 series · 1 of 1 positions shown · non-contrast
Comparison: 05/11/2015 .

CLINICAL DATA: Chest tube.  CABG.

EXAM:
PORTABLE CHEST 1 VIEW

[AP]
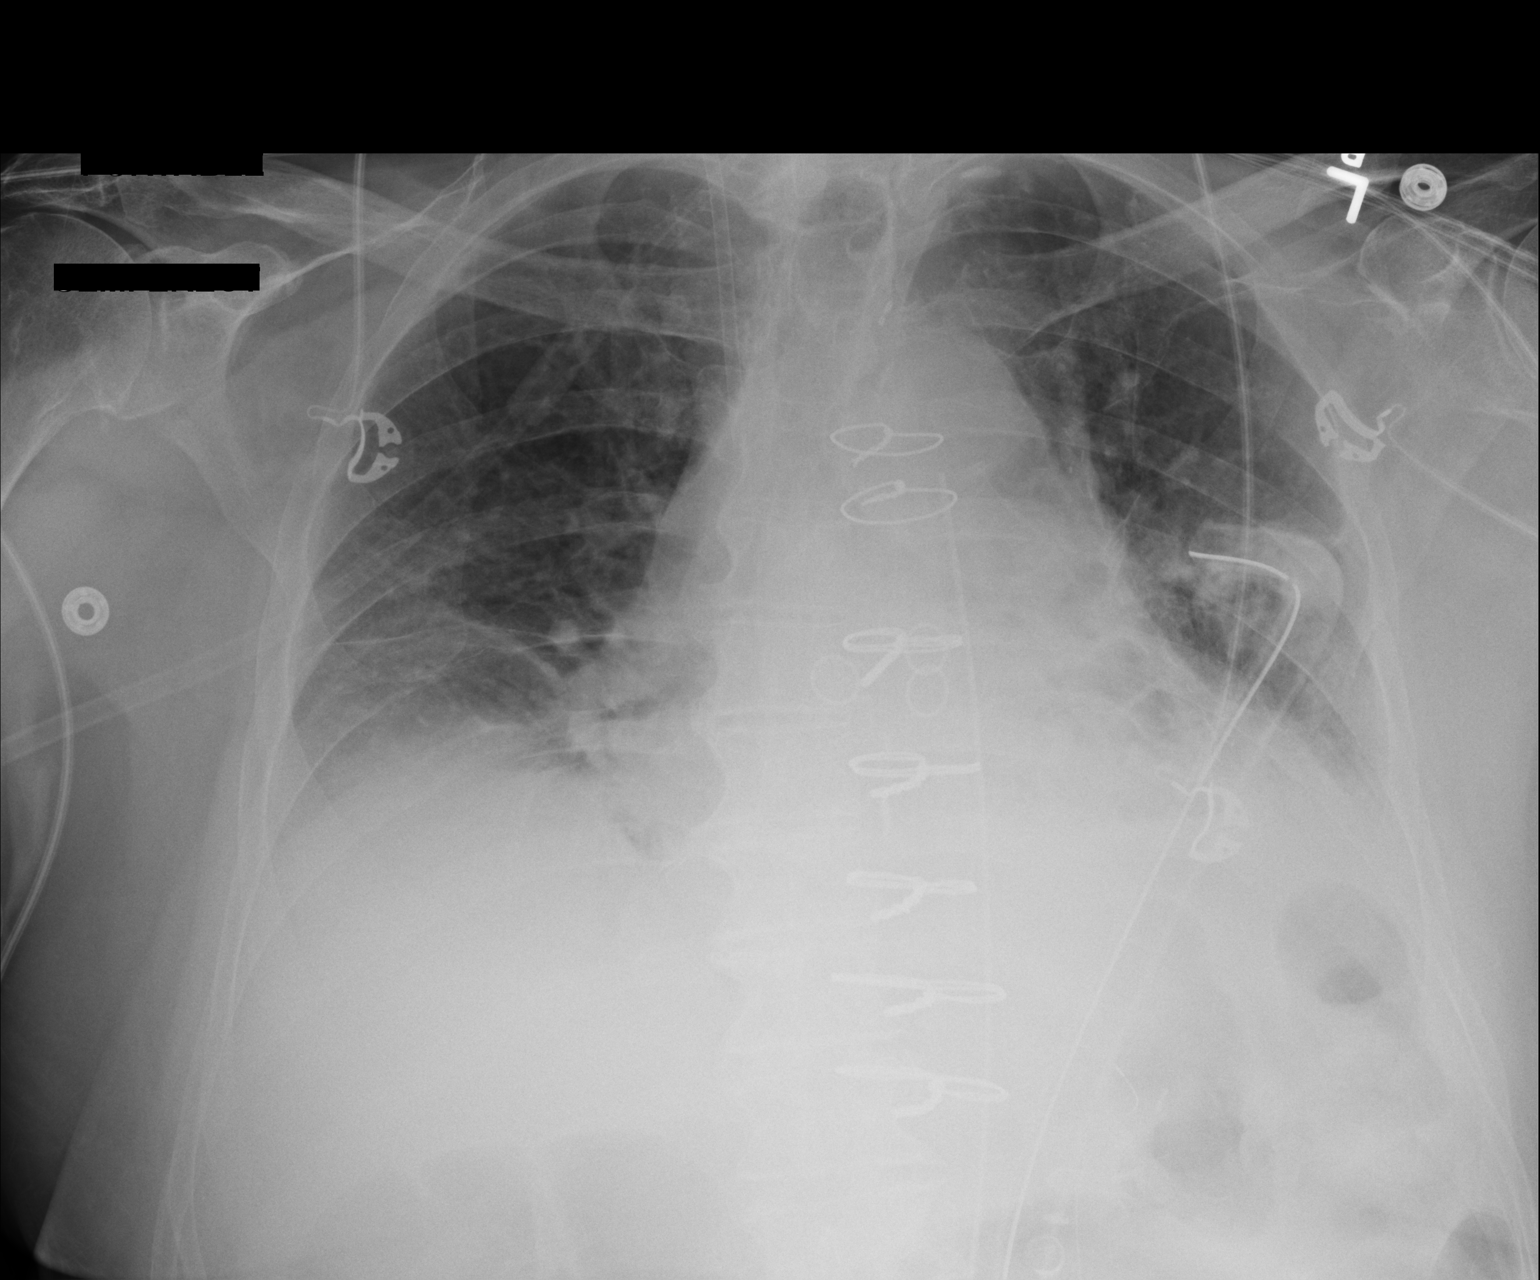

[1 of 1 positions shown; findings below may reference images not displayed]

FINDINGS: Interim removal of Swan-Ganz catheter. Right IJ line, mediastinal
drainage catheter, left chest tube in stable position. Cardiomegaly.
Prior CABG. Low lung volumes with mild basilar atelectasis and/or
infiltrates. No interim change. No pleural effusion or pneumothorax.
IMPRESSION: 1. Interim removal Swan-Ganz catheter. Remaining lines and tubes in
stable position. No pneumothorax.

2. Prior CABG. Stable cardiomegaly. No pulmonary venous congestion.

3.  Low lung volumes with small bibasilar atelectasis .

## 2016-02-22 IMAGING — DX DG CHEST 2V
2 series · 2 of 2 positions shown · non-contrast
Comparison: 05/12/2015.

CLINICAL DATA: CABG.

EXAM:
CHEST  2 VIEW

[chest pa]
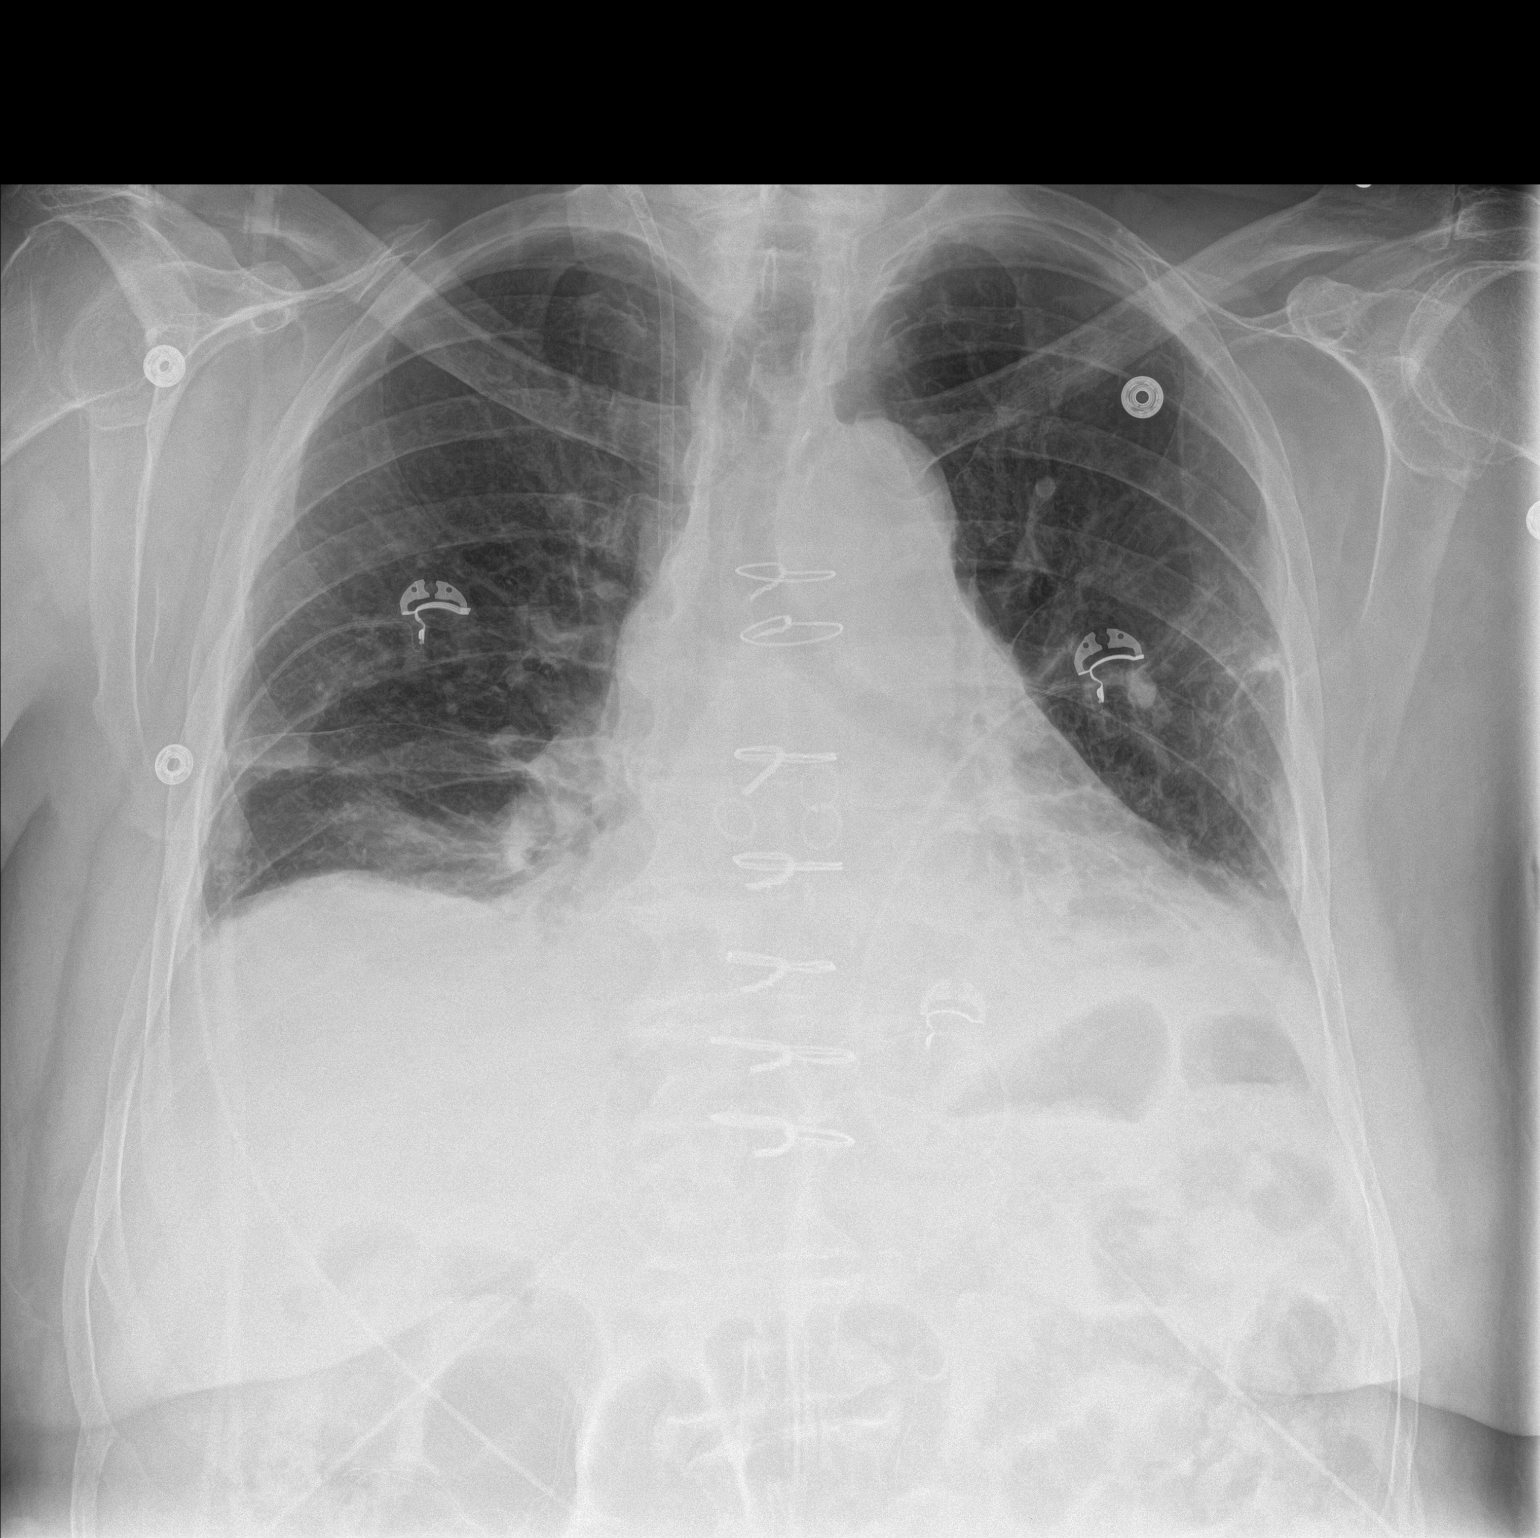

[chest lat]
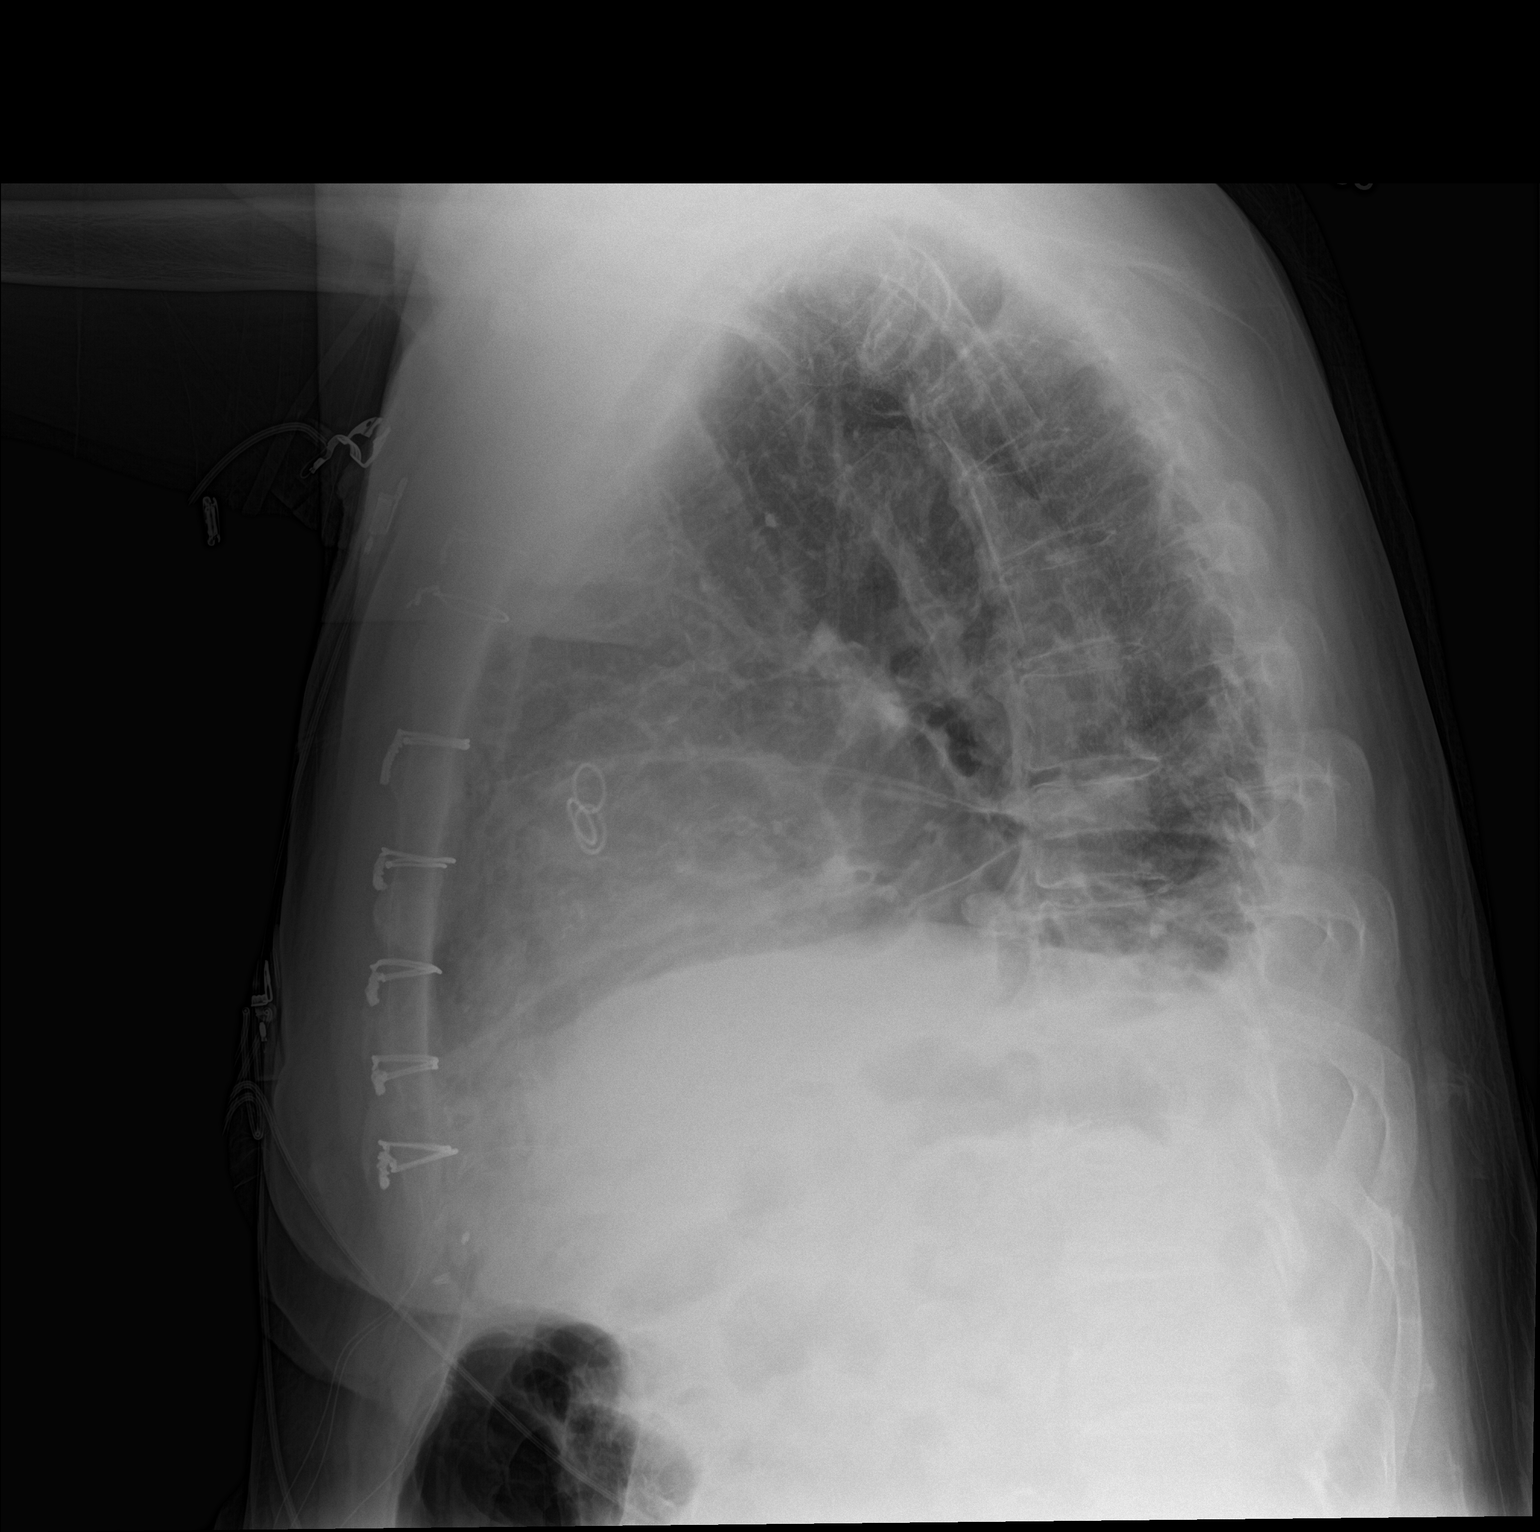

[2 of 2 positions shown; findings below may reference images not displayed]

FINDINGS: Right IJ sheath in good anatomic position. Interim removal of left
chest tube and mediastinal drainage catheter. Prior CABG. Stable
cardiomegaly. No pulmonary venous congestion. Low lung volumes with
bibasilar atelectasis and/or infiltrates again noted. No interim
change. No pleural effusion or pneumothorax.
IMPRESSION: 1. Interim removal of left chest tube and mediastinal drainage
catheter. Right IJ sheath in stable position. No pneumothorax.

2.  Prior CABG.  Heart size stable.

3.  Low lung volumes with stable bibasilar subsegmental atelectasis.

## 2016-04-04 ENCOUNTER — Encounter: Payer: Self-pay | Admitting: Cardiovascular Disease

## 2016-04-04 ENCOUNTER — Ambulatory Visit (INDEPENDENT_AMBULATORY_CARE_PROVIDER_SITE_OTHER): Payer: Medicare Other | Admitting: Cardiovascular Disease

## 2016-04-04 VITALS — BP 124/82 | HR 62 | Ht 72.0 in | Wt 221.5 lb

## 2016-04-04 DIAGNOSIS — I10 Essential (primary) hypertension: Secondary | ICD-10-CM | POA: Diagnosis not present

## 2016-04-04 DIAGNOSIS — E785 Hyperlipidemia, unspecified: Secondary | ICD-10-CM

## 2016-04-04 DIAGNOSIS — Z951 Presence of aortocoronary bypass graft: Secondary | ICD-10-CM

## 2016-04-04 DIAGNOSIS — R0989 Other specified symptoms and signs involving the circulatory and respiratory systems: Secondary | ICD-10-CM

## 2016-04-04 DIAGNOSIS — Z23 Encounter for immunization: Secondary | ICD-10-CM | POA: Diagnosis not present

## 2016-04-04 DIAGNOSIS — I25810 Atherosclerosis of coronary artery bypass graft(s) without angina pectoris: Secondary | ICD-10-CM | POA: Diagnosis not present

## 2016-04-04 NOTE — Progress Notes (Signed)
He Cardiology Office Note  Date:  04/04/2016   ID:  RORIK VESPA, DOB Jun 27, 1941, MRN [id number]  PCP:  Marguarite Arbour, MD   Chief Complaint  Patient presents with  . other    12 month follow up. Meds reviewed by the pt. verbally. "doing well."     HPI:  Mr. Sobotka is a very pleasant 75 year old male with a history of coronary artery disease, status post previous stenting of the LAD and right coronary artery with drug-eluting stents.   Catheterization in April 2005, which showed moderate diffuse nonobstructive disease.  bypass surgery x 4 at [facility] 05/2015 For severe three-vessel disease  History of hypertension, hyperlipidemia, kidney stones Never smoked He presents today for follow-up of his coronary artery disease  In general he reports that he feels well, no symptoms of chest pain  he is walking 3 miles several days per week Denies shortness of breath on exertion No significant lower extremity edema Some medication confusion, he has lisinopril and losartan on his list.  ACE inhibitor was held on his last office visit for cough We had recommended he take Lasix as needed for leg swelling It appears he is taking this daily, though on his prescription paper, he reported he was not taking this regularly when questioned about it, he is unsure  He does report some congestion in his chest We did discuss previous upper respiratory infection at the end of 2016 following surgery requiring prednisone and Z-Pak He reports symptoms were severe at that time  He had positive stress test (anterior wall ) at the end of 2016 (for chest pain and shortness of breath) leading to cardiac catheterization showing three-vessel disease, severe ostial LAD disease Sent to [facility] for a CABG He had postoperative atrial fibrillation Reports he has completed cardiac rehabilitation, now exercising regularly, walks 4 miles with no symptoms of shortness of breath or chest discomfort.  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 EF 54%.   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  he is working full-time job.   PMH:   has a past medical history of Arthritis; CAD (coronary artery disease); ED (erectile dysfunction) of non-organic origin; Essential hypertension; History of pneumonia; Hyperlipidemia, mixed; Kidney stones; Leg cramps; and Post-operative Atrial Fibrillation.  PSH:    Past Surgical History:  Procedure Laterality Date  . BACK SURGERY     2 lunbar surgeries  . CARDIAC CATHETERIZATION N/A 04/30/2015   Procedure: Left Heart Cath;  Surgeon: Antonieta Iba, MD;  Location: [facility] INVASIVE CV LAB;  Service: Cardiovascular;  Laterality: N/A;  . CORONARY ANGIOPLASTY     3 stents  . CORONARY ARTERY BYPASS GRAFT N/A 05/10/2015   Procedure: CORONARY ARTERY BYPASS GRAFTING (CABG), ON PUMP, TIMES FOUR, USING LEFT INTERNAL MAMMARY ARTERY, BILATERAL GREATER SAPHENOUS VEINS HARVESTED ENDOSCOPICALLY, WITH CORONARY ENDARTERECTOMY;  Surgeon: Kerin Perna, MD;  Location: [facility] OR;  Service: Open Heart Surgery;  Laterality: N/A;  . RETINAL DETACHMENT SURGERY Left   . TEE  WITHOUT CARDIOVERSION N/A 05/10/2015   Procedure: TRANSESOPHAGEAL ECHOCARDIOGRAM (TEE);  Surgeon: Kerin Perna, MD;  Location: [facility] OR;  Service: Open Heart Surgery;  Laterality: N/A;    Current Outpatient Prescriptions  Medication Sig Dispense Refill  . aspirin 81 MG tablet Take 81 mg by mouth daily.      . clopidogrel  (PLAVIX) 75 MG tablet TAKE 1 TABLET EVERY DAY 90 tablet 3  . diazepam (VALIUM) 5 MG tablet Take 1 tablet (5 mg total) by mouth every 6 (six) hours as needed for anxiety. 30 tablet 0  . fexofenadine (ALLEGRA) 180 MG tablet Take 180 mg by mouth 2 (two) times a week.    . furosemide (LASIX) 20 MG tablet Take 1 tablet (20 mg total) by mouth daily as needed. 30 tablet 3  . losartan (COZAAR) 25 MG tablet Take 1 tablet (25 mg total) by mouth daily. 30 tablet 11  . metoprolol tartrate (LOPRESSOR) 25 MG tablet TAKE 1/2 TABLET BY MOUTH 2 TIMES A DAY 30 tablet 3  . Multiple Vitamins-Minerals (CENTRUM PO) Take by mouth daily.      . Omega-3 Fatty Acids (FISH OIL) 1000 MG CAPS Take 1,000 mg by mouth 2 (two) times daily.    . simvastatin (ZOCOR) 40 MG tablet TAKE 1 TABLET EVERY DAY 90 tablet 3   No current facility-administered medications for this visit.      Allergies:   Codeine   Social History:  The patient  reports that he has never smoked. He has never used smokeless tobacco. He reports that he does not drink alcohol or use drugs.   Family History:   Family history is unknown by patient.    Review of Systems: Review of Systems  Constitutional: Negative.   Respiratory: Negative.   Cardiovascular: Negative.   Gastrointestinal: Negative.   Musculoskeletal: Negative.   Neurological: Negative.   Psychiatric/Behavioral: Negative.   All other systems reviewed and are negative.    PHYSICAL EXAM: VS:  BP 124/82 (BP Location: Left Arm, Patient Position: Sitting, Cuff Size: Normal)   Pulse 62   Ht 6' (1.829 m)   Wt 221 lb 8 oz (100.5 kg)   BMI 30.04 kg/m  , BMI Body mass index is 30.04 kg/m. GEN: Well nourished, well developed, in no acute distress  HEENT: normal  Neck: no JVD, carotid bruits, or masses Cardiac: RRR; no murmurs, rubs, or gallops,no edema  Respiratory:  Rales at the bases b/l R>L,  normal work of breathing GI: soft, nontender, nondistended, + BS MS: no deformity or  atrophy  Skin: warm and dry, no rash Neuro:  Strength and sensation are intact Psych: euthymic mood, full affect   Recent Labs: 05/06/2015: ALT 13 05/11/2015: Magnesium 2.3 05/13/2015: Hemoglobin 8.6; Platelets 131 05/15/2015: BUN 15; Creatinine, Ser 0.97; Potassium 4.3; Sodium 130    Lipid Panel No results found for: CHOL, HDL, LDLCALC, TRIG    Wt Readings from Last 3 Encounters:  04/04/16 221 lb 8 oz (100.5 kg)  09/28/15 219 lb 8 oz (99.6 kg)  08/18/15 220 lb (99.8 kg)       ASSESSMENT AND PLAN:  Coronary artery disease involving coronary bypass graft of native heart without angina pectoris - Plan: EKG 12-Lead Currently with no symptoms of angina. No further workup at this time. Continue current medication regimen.  Essential hypertension - Plan: EKG 12-Lead Blood pressure is well controlled on today's visit. No changes made to the medications.  Encounter for immunization - Plan: Flu Vaccine QUAD 36+  mos IM He received flu shot today  Hyperlipidemia LDL goal <70 LDL slightly above goal, recommended continued exercise, diet modification No changes to his simvastatin  S/P CABG x 4 Previous anginal symptoms discussed with him. He denies any new changes to the way he feels. No further testing ordered  Chest Rales Etiology unclear, worse at the right base than the left, possibly from old scarring from previous upper respiratory infection in the 2016 Suggested differential to him, recommended we monitor this for now   Total encounter time more than 25 minutes  Greater than 50% was spent in counseling and coordination of care with the patient   Disposition:   F/U  6 months   Orders Placed This Encounter  Procedures  . Flu Vaccine QUAD 36+ mos IM  . EKG 12-Lead     Signed, Dossie Arbourim Ruhaan Nordahl, M.D., Ph.D. 04/04/2016  [facility] [place] [phone]

## 2016-04-04 NOTE — Patient Instructions (Signed)

## 2016-05-29 ENCOUNTER — Other Ambulatory Visit: Payer: Self-pay | Admitting: Cardiovascular Disease

## 2016-07-28 ENCOUNTER — Other Ambulatory Visit: Payer: Self-pay | Admitting: Cardiovascular Disease

## 2016-08-16 ENCOUNTER — Other Ambulatory Visit: Payer: Self-pay | Admitting: Nurse Practitioner

## 2016-09-28 ENCOUNTER — Other Ambulatory Visit: Payer: Self-pay | Admitting: Cardiovascular Disease

## 2016-09-28 MED ORDER — METOPROLOL TARTRATE 25 MG PO TABS: ORAL_TABLET | ORAL | 0 refills | Status: DC

## 2016-09-28 MED ORDER — FUROSEMIDE 20 MG PO TABS: 20.0000 mg | ORAL_TABLET | Freq: Every day | ORAL | 0 refills | Status: DC | PRN

## 2016-09-30 ENCOUNTER — Other Ambulatory Visit: Payer: Self-pay | Admitting: Cardiovascular Disease

## 2016-10-03 ENCOUNTER — Other Ambulatory Visit: Payer: Self-pay | Admitting: Cardiovascular Disease

## 2016-10-03 MED ORDER — FUROSEMIDE 20 MG PO TABS: 20.0000 mg | ORAL_TABLET | Freq: Every day | ORAL | 0 refills | Status: DC | PRN

## 2016-10-14 NOTE — Progress Notes (Signed)
Cardiology Office Note  Date:  10/17/2016   ID:  [name], DOB Jun 16, 1941, MRN [id number]  PCP:  Marguarite Arbour, MD   Chief Complaint  Patient presents with  . other    6 month follow up. Meds reviewed by the pt. verbally. "doing well."     HPI:  [name] is a very pleasant 76 year old male with a history of coronary artery disease,  stenting of the LAD and right coronary artery with drug-eluting stents. Catheterization in April 2005, which showed moderate diffuse nonobstructive disease.  bypass surgery x 4 at [facility] 05/2015 For severe 3 vessel dz postoperative atrial fibrillation hypertension,  hyperlipidemia,  kidney stones Never smoked Hx of bronchitis He presents today for follow-up of his coronary artery disease  no symptoms of chest pain  he is walking 4 miles several days per week Denies shortness of breath on exertion No significant lower extremity edema  Fell off treadmill 2 weeksn Hurt left side "maybe broke a rib"  Stopped lisinopril for cough Stopped losartan on his own. Thought it causes a cough (bronchitis) "I'M ok"  lasix PRN  Lab work reviewed with him Total chol 122, LDL 71  EKG personally reviewed by myself on todays visit shows normal sinus rhythm with rate 61 bpm, no significant ST or T-wave changes, left anterior fascicular block  Other past medical history He is been seen by pulmonary, had CT scan 02/08/2015, showing mild interstitial change  positive stress test (anterior wall ) at the end of 2016 (for chest pain and shortness of breath) leading to cardiac catheterization showing three-vessel disease, severe ostial LAD disease Sent to [facility] for a CABG He had postoperative atrial fibrillation  Myoview in April 2008 which showed a 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 EF 54%.  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  he is working full-time job.   PMH:   has a past medical history of Arthritis; CAD (coronary artery disease); ED (erectile dysfunction) of non-organic origin; Essential hypertension; History of pneumonia; Hyperlipidemia, mixed; Kidney stones; Leg cramps; and Post-operative Atrial Fibrillation.  PSH:    Past Surgical History:  Procedure Laterality Date  . BACK SURGERY     2 lunbar surgeries  . CARDIAC CATHETERIZATION N/A 04/30/2015   Procedure: Left Heart Cath;  Surgeon: Antonieta Iba, MD;  Location: [facility] INVASIVE CV LAB;  Service: Cardiovascular;  Laterality: N/A;  . CORONARY ANGIOPLASTY     3 stents  . CORONARY ARTERY BYPASS GRAFT N/A 05/10/2015   Procedure: CORONARY ARTERY BYPASS GRAFTING (CABG), ON PUMP, TIMES FOUR, USING LEFT INTERNAL MAMMARY ARTERY, BILATERAL GREATER SAPHENOUS VEINS HARVESTED ENDOSCOPICALLY, WITH CORONARY ENDARTERECTOMY;  Surgeon: Kerin Perna, MD;  Location: [facility] OR;  Service: Open Heart Surgery;  Laterality: N/A;  . RETINAL DETACHMENT SURGERY Left   . TEE WITHOUT CARDIOVERSION N/A 05/10/2015   Procedure: TRANSESOPHAGEAL ECHOCARDIOGRAM (TEE);  Surgeon: Kerin Perna, MD;  Location: [facility] OR;  Service: Open Heart Surgery;  Laterality: N/A;    Current Outpatient Prescriptions  Medication Sig Dispense Refill  . aspirin 81 MG tablet Take 81 mg by mouth daily.      . clopidogrel (PLAVIX) 75 MG tablet TAKE 1 TABLET EVERY DAY 90 tablet 3  . fexofenadine (ALLEGRA) 180 MG tablet Take 180 mg by mouth 2 (two) times a week.    Marland Kitchen  furosemide (LASIX) 20 MG tablet TAKE 1 TABLET (20 MG TOTAL) BY MOUTH DAILY AS NEEDED. 30 tablet 0  . metoprolol tartrate (LOPRESSOR) 25 MG tablet TAKE 1/2 TABLET BY MOUTH 2 TIMES A DAY 90 tablet 0  . simvastatin (ZOCOR) 40 MG tablet TAKE 1 TABLET EVERY DAY 90 tablet 3   No current facility-administered medications for this visit.      Allergies:   Codeine   Social History:   The patient  reports that he has never smoked. He has never used smokeless tobacco. He reports that he does not drink alcohol or use drugs.   Family History:   Family history is unknown by patient.    Review of Systems: Review of Systems  Constitutional: Negative.   Respiratory: Negative.   Cardiovascular: Negative.   Gastrointestinal: Negative.   Musculoskeletal: Negative.        Left flank/rib tenderness  Neurological: Negative.   Psychiatric/Behavioral: Negative.   All other systems reviewed and are negative.    PHYSICAL EXAM: VS:  BP 120/80 (BP Location: Left Arm, Patient Position: Sitting, Cuff Size: Normal)   Pulse 61   Ht 6' (1.829 m)   Wt 221 lb (100.2 kg)   BMI 29.97 kg/m  , BMI Body mass index is 29.97 kg/m. GEN: Well nourished, well developed, in no acute distress  HEENT: normal  Neck: no JVD, carotid bruits, or masses Cardiac: RRR; no murmurs, rubs, or gallops,no edema  Respiratory:  clear to auscultation bilaterally, normal work of breathing GI: soft, nontender, nondistended, + BS MS: no deformity or atrophy , Region of bruising left flank Skin: warm and dry, no rash Neuro:  Strength and sensation are intact Psych: euthymic mood, full affect    Recent Labs: No results found for requested labs within last 8760 hours.    Lipid Panel No results found for: CHOL, HDL, LDLCALC, TRIG    Wt Readings from Last 3 Encounters:  10/17/16 221 lb (100.2 kg)  04/04/16 221 lb 8 oz (100.5 kg)  09/28/15 219 lb 8 oz (99.6 kg)       ASSESSMENT AND PLAN:  Essential hypertension - Plan: EKG 12-Lead Stop losartan on his own, feels fine We'll not add it back on today's visit Blood pressure well controlled. Discussed risk and benefit of each medication on his list  Atherosclerosis of native coronary artery of native heart with angina pectoris (HCC) - Plan: EKG 12-Lead Currently with no symptoms of angina. No further workup at this time. Continue current medication  regimen.  Hyperlipidemia LDL goal <70 - Plan: EKG 12-Lead Cholesterol is at goal on the current lipid regimen. No changes to the medications were made.  S/P CABG x 4 - Plan: EKG 12-Lead Denies any anginal symptoms, exercising up to 4 miles on his treadmill Discussed risk and benefit of aspirin and Plavix Prefers to stay on his current regimen  Fall, initial encounter Recent fall off his treadmill, going very fast and fell Bruising evaluated, no indication for workup at this time Currently not in distress  Disposition:   F/U  6 months   Total encounter time more than 25 minutes  Greater than 50% was spent in counseling and coordination of care with the patient    Orders Placed This Encounter  Procedures  . EKG 12-Lead     Signed, Dossie Arbour, M.D., Ph.D. 10/17/2016  [facility] [place], [place] [phone]

## 2016-10-17 ENCOUNTER — Ambulatory Visit (INDEPENDENT_AMBULATORY_CARE_PROVIDER_SITE_OTHER): Payer: Medicare Other | Admitting: Cardiovascular Disease

## 2016-10-17 ENCOUNTER — Encounter: Payer: Self-pay | Admitting: Cardiovascular Disease

## 2016-10-17 VITALS — BP 120/80 | HR 61 | Ht 72.0 in | Wt 221.0 lb

## 2016-10-17 DIAGNOSIS — W19XXXA Unspecified fall, initial encounter: Secondary | ICD-10-CM

## 2016-10-17 DIAGNOSIS — I25119 Atherosclerotic heart disease of native coronary artery with unspecified angina pectoris: Secondary | ICD-10-CM

## 2016-10-17 DIAGNOSIS — I10 Essential (primary) hypertension: Secondary | ICD-10-CM

## 2016-10-17 DIAGNOSIS — E785 Hyperlipidemia, unspecified: Secondary | ICD-10-CM

## 2016-10-17 DIAGNOSIS — Z951 Presence of aortocoronary bypass graft: Secondary | ICD-10-CM | POA: Diagnosis not present

## 2016-10-17 NOTE — Patient Instructions (Signed)

## 2016-11-06 ENCOUNTER — Telehealth: Payer: Self-pay | Admitting: *Deleted

## 2016-11-06 MED ORDER — LOSARTAN POTASSIUM 25 MG PO TABS: 25.0000 mg | ORAL_TABLET | Freq: Every day | ORAL | 3 refills | Status: DC

## 2016-11-06 NOTE — Telephone Encounter (Signed)
Pt previously stopped taking these on his own.  Ok to resume.  Refill sent in.

## 2016-11-06 NOTE — Telephone Encounter (Signed)
Losartan 25 mg tablet refill request received please advise if ok to refill not on medication list.  Losartan 25 mg tablet Takes 1 tablet by mouth daily. R#90 day refill.

## 2017-02-05 ENCOUNTER — Other Ambulatory Visit: Payer: Self-pay | Admitting: Cardiovascular Disease

## 2017-02-24 ENCOUNTER — Other Ambulatory Visit: Payer: Self-pay | Admitting: Cardiovascular Disease

## 2017-04-26 ENCOUNTER — Telehealth: Payer: Self-pay | Admitting: Cardiovascular Disease

## 2017-04-26 NOTE — Telephone Encounter (Signed)
Received request for clearance for upcoming colonoscopy on 06/19/17 at [facility] with Dr. Norma Fredricksonoledo and instructions on Plavix. Please route clearance to Fax # [phone].

## 2017-04-26 NOTE — Telephone Encounter (Signed)
Spoke with patients daughter and she states that he is doing great with good blood pressures and no new complaints. She confirmed his upcoming procedure and had no further questions at this time.

## 2017-04-27 NOTE — Telephone Encounter (Signed)
Acceptable risk for procedure and to hold Plavix 5 days before procedure Would restart Plavix after procedure complete

## 2017-04-30 NOTE — Telephone Encounter (Signed)
Clearance routed to number provided.  

## 2017-05-28 ENCOUNTER — Other Ambulatory Visit: Payer: Self-pay | Admitting: Cardiovascular Disease

## 2017-06-04 ENCOUNTER — Other Ambulatory Visit: Payer: Self-pay | Admitting: Cardiovascular Disease

## 2017-06-09 ENCOUNTER — Other Ambulatory Visit: Payer: Self-pay | Admitting: Cardiovascular Disease

## 2017-06-12 ENCOUNTER — Other Ambulatory Visit: Payer: Self-pay | Admitting: Cardiovascular Disease

## 2017-06-12 NOTE — Telephone Encounter (Signed)
Patient needs a follow up appointment before anymore refills.

## 2017-06-19 ENCOUNTER — Encounter: Admission: RE | Payer: Self-pay | Source: Ambulatory Visit

## 2017-06-19 ENCOUNTER — Ambulatory Visit: Admission: RE | Admit: 2017-06-19 | Payer: Medicare Other | Source: Ambulatory Visit | Admitting: Internal Medicine

## 2017-06-19 SURGERY — COLONOSCOPY WITH PROPOFOL
Anesthesia: General

## 2017-07-11 ENCOUNTER — Other Ambulatory Visit: Payer: Self-pay | Admitting: Cardiovascular Disease

## 2017-07-25 ENCOUNTER — Other Ambulatory Visit: Payer: Self-pay | Admitting: Cardiovascular Disease

## 2017-07-28 NOTE — Progress Notes (Signed)
Cardiology Office Note  Date:  07/31/2017   ID:  [name], DOB 1940/09/04, MRN [id number]  PCP:  Marguarite Arbour, MD   Chief Complaint  Patient presents with  . Other    6 month follow up. Patient denies chest pain and SOB. Meds reviewed verbally with patient.     HPI:  [name] is a very pleasant 77 year old male with a history of coronary artery disease,  stenting of the LAD and right coronary artery with drug-eluting stents. Catheterization in April 2005, which showed moderate diffuse nonobstructive disease.   bypass surgery x 4 at [facility] 05/2015 For severe 3 vessel dz postoperative atrial fibrillation hypertension,  hyperlipidemia,  kidney stones Never smoked Hx of bronchitis He presents today for follow-up of his coronary artery disease  No complaints on today's visit Recovered from falling off treadmill last year 2018 Thought he may have broken a rib  Having some knee pain  denies any shortness of breath on exertion Uses his treadmill Used to do 20 miles a week, now not doing as much  S previously topped lisinopril for cough Stopped losartan on his own. Thought it causes a cough (bronchitis)  lasix PRN Denies leg edema  Lab work reviewed with him Total chol 118, LDL 61  EKG personally reviewed by myself on todays visit shows normal sinus rhythm with rate 57 bpm, no significant ST or T-wave changes, left anterior fascicular block  Other past medical history He is been seen by pulmonary, had CT scan 02/08/2015, showing mild interstitial change  positive stress test (anterior wall ) at the end of 2016 (for chest pain and shortness of breath) leading to cardiac catheterization showing three-vessel disease, severe ostial LAD disease Sent to [facility] for a CABG He had postoperative atrial fibrillation  Myoview in April 2008 which showed a 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 EF 54%.  severe diarrhea October 2015 and was put in the  hospital for dehydration He developed the flu in February 2016  Previously had problems with chronic dizziness which he attributes to in her ear problem.  he is working full-time job.   PMH:   has a past medical history of Arthritis, CAD (coronary artery disease), ED (erectile dysfunction) of non-organic origin, Essential hypertension, History of pneumonia, Hyperlipidemia, mixed, Kidney stones, Leg cramps, and Post-operative Atrial Fibrillation.  PSH:    Past Surgical History:  Procedure Laterality Date  . BACK SURGERY     2 lunbar surgeries  . CARDIAC CATHETERIZATION N/A 04/30/2015   Procedure: Left Heart Cath;  Surgeon: Antonieta Iba, MD;  Location: [facility] INVASIVE CV LAB;  Service: Cardiovascular;  Laterality: N/A;  . CORONARY ANGIOPLASTY     3 stents  . CORONARY ARTERY BYPASS GRAFT N/A 05/10/2015   Procedure: CORONARY ARTERY BYPASS GRAFTING (CABG), ON PUMP, TIMES FOUR, USING LEFT INTERNAL MAMMARY ARTERY, BILATERAL GREATER SAPHENOUS VEINS HARVESTED ENDOSCOPICALLY, WITH CORONARY ENDARTERECTOMY;  Surgeon: Kerin Perna, MD;  Location: [facility] OR;  Service: Open Heart Surgery;  Laterality: N/A;  . RETINAL DETACHMENT SURGERY Left   . TEE WITHOUT CARDIOVERSION N/A 05/10/2015   Procedure: TRANSESOPHAGEAL ECHOCARDIOGRAM (TEE);  Surgeon: Kerin Perna, MD;  Location: [facility] OR;  Service: Open Heart Surgery;  Laterality: N/A;    Current Outpatient Medications  Medication Sig Dispense Refill  . aspirin 81 MG tablet Take 81 mg by mouth daily.      . clopidogrel (PLAVIX) 75 MG tablet TAKE 1 TABLET EVERY DAY 30 tablet 0  . fexofenadine (  ALLEGRA) 180 MG tablet Take 180 mg by mouth 2 (two) times a week.    . furosemide (LASIX) 20 MG tablet TAKE 1 TABLET (20 MG TOTAL) BY MOUTH DAILY AS NEEDED. 30 tablet 0  . metoprolol tartrate (LOPRESSOR) 25 MG tablet TAKE 1/2 TABLET BY MOUTH 2 TIMES A DAY 30 tablet 0  . simvastatin (ZOCOR) 40 MG tablet TAKE 1 TABLET BY MOUTH EVERY DAY 30 tablet 0  . losartan  (COZAAR) 25 MG tablet Take 1 tablet (25 mg total) by mouth daily. 90 tablet 3   No current facility-administered medications for this visit.      Allergies:   Codeine   Social History:  The patient  reports that  has never smoked. he has never used smokeless tobacco. He reports that he does not drink alcohol or use drugs.   Family History:   Family history is unknown by patient.    Review of Systems: Review of Systems  Constitutional: Negative.   Respiratory: Negative.   Cardiovascular: Negative.   Gastrointestinal: Negative.   Musculoskeletal: Positive for joint pain.       Left flank/rib tenderness  Neurological: Negative.   Psychiatric/Behavioral: Negative.   All other systems reviewed and are negative.    PHYSICAL EXAM: VS:  BP 136/82 (BP Location: Left Arm, Patient Position: Sitting, Cuff Size: Normal)   Pulse (!) 57   Ht 6' (1.829 m)   Wt 228 lb 8 oz (103.6 kg)   BMI 30.99 kg/m  , BMI Body mass index is 30.99 kg/m. Constitutional:  oriented to person, place, and time. No distress.  HENT:  Head: Normocephalic and atraumatic.  Eyes:  no discharge. No scleral icterus.  Neck: Normal range of motion. Neck supple. No JVD present.  Cardiovascular: Normal rate, regular rhythm, normal heart sounds and intact distal pulses. Exam reveals no gallop and no friction rub. No edema No murmur heard. Pulmonary/Chest: Effort normal and breath sounds normal. No stridor. No respiratory distress.  no wheezes.  no rales.  no tenderness.  Abdominal: Soft.  no distension.  no tenderness.  Musculoskeletal: Normal range of motion.  no  tenderness or deformity.  Neurological:  normal muscle tone. Coordination normal. No atrophy Skin: Skin is warm and dry. No rash noted. not diaphoretic.  Psychiatric:  normal mood and affect. behavior is normal. Thought content normal.       Recent Labs: No results found for requested labs within last 8760 hours.    Lipid Panel No results found  for: CHOL, HDL, LDLCALC, TRIG    Wt Readings from Last 3 Encounters:  07/31/17 228 lb 8 oz (103.6 kg)  10/17/16 221 lb (100.2 kg)  04/04/16 221 lb 8 oz (100.5 kg)       ASSESSMENT AND PLAN:  Essential hypertension - Plan: EKG 12-Lead.bpok Blood pressure is well controlled on today's visit. No changes made to the medications.  Atherosclerosis of native coronary artery of native heart with angina pectoris (HCC) - Plan: EKG 12-Lead Currently with no symptoms of angina. No further workup at this time. Continue current medication regimen.  Stable, exercising   Hyperlipidemia LDL goal <70 - Plan: EKG 12-Lead Cholesterol is at goal on the current lipid regimen. No changes to the medications were made.  Best numbers he has had yet, reviewed with him in detail  S/P CABG x 4 - Plan: EKG 12-Lead Stay on current regiment Previously discussed stopping Plavix, he preferred to stay on this Exercising without angina  Disposition:   F/U  12 months   Total encounter time more than 25 minutes  Greater than 50% was spent in counseling and coordination of care with the patient    Orders Placed This Encounter  Procedures  . EKG 12-Lead     Signed, Dossie Arbourim Brandace Cargle, M.D., Ph.D. 07/31/2017  [facility], [place] [phone]

## 2017-07-31 ENCOUNTER — Encounter: Payer: Self-pay | Admitting: Cardiovascular Disease

## 2017-07-31 ENCOUNTER — Ambulatory Visit: Payer: Medicare Other | Admitting: Cardiovascular Disease

## 2017-07-31 VITALS — BP 136/82 | HR 57 | Ht 72.0 in | Wt 228.5 lb

## 2017-07-31 DIAGNOSIS — E785 Hyperlipidemia, unspecified: Secondary | ICD-10-CM | POA: Diagnosis not present

## 2017-07-31 DIAGNOSIS — I10 Essential (primary) hypertension: Secondary | ICD-10-CM | POA: Diagnosis not present

## 2017-07-31 DIAGNOSIS — E782 Mixed hyperlipidemia: Secondary | ICD-10-CM

## 2017-07-31 DIAGNOSIS — I25119 Atherosclerotic heart disease of native coronary artery with unspecified angina pectoris: Secondary | ICD-10-CM

## 2017-07-31 DIAGNOSIS — Z951 Presence of aortocoronary bypass graft: Secondary | ICD-10-CM | POA: Diagnosis not present

## 2017-07-31 MED ORDER — CLOPIDOGREL BISULFATE 75 MG PO TABS: 75.0000 mg | ORAL_TABLET | Freq: Every day | ORAL | 3 refills | Status: DC

## 2017-07-31 MED ORDER — SIMVASTATIN 40 MG PO TABS: 40.0000 mg | ORAL_TABLET | Freq: Every day | ORAL | 3 refills | Status: DC

## 2017-07-31 MED ORDER — LOSARTAN POTASSIUM 25 MG PO TABS: 25.0000 mg | ORAL_TABLET | Freq: Every day | ORAL | 3 refills | Status: DC

## 2017-07-31 MED ORDER — METOPROLOL TARTRATE 25 MG PO TABS: ORAL_TABLET | ORAL | 3 refills | Status: DC

## 2017-07-31 MED ORDER — FUROSEMIDE 20 MG PO TABS: 20.0000 mg | ORAL_TABLET | Freq: Every day | ORAL | 3 refills | Status: DC | PRN

## 2017-07-31 NOTE — Patient Instructions (Addendum)

## 2017-07-31 NOTE — Addendum Note (Signed)
Addended by: Bryna ColanderALLEN, Labarron Durnin S on: 07/31/2017 12:16 PM   Modules accepted: Orders

## 2017-08-21 ENCOUNTER — Encounter: Payer: Self-pay | Admitting: *Deleted

## 2017-08-22 ENCOUNTER — Encounter
Admission: RE | Disposition: A | Discharge status: Home / Self Care | Payer: Self-pay | Source: Ambulatory Visit | Attending: Internal Medicine

## 2017-08-22 ENCOUNTER — Ambulatory Visit
Admission: RE | Admit: 2017-08-22 | Discharge: 2017-08-22 | Disposition: A | Discharge status: Home / Self Care | Payer: Medicare Other | Source: Ambulatory Visit | Attending: Internal Medicine | Admitting: Internal Medicine

## 2017-08-22 ENCOUNTER — Ambulatory Visit: Payer: Medicare Other | Admitting: Anesthesiology

## 2017-08-22 DIAGNOSIS — Z7982 Long term (current) use of aspirin: Secondary | ICD-10-CM | POA: Diagnosis not present

## 2017-08-22 DIAGNOSIS — Z885 Allergy status to narcotic agent status: Secondary | ICD-10-CM | POA: Insufficient documentation

## 2017-08-22 DIAGNOSIS — I252 Old myocardial infarction: Secondary | ICD-10-CM | POA: Insufficient documentation

## 2017-08-22 DIAGNOSIS — Z86010 Personal history of colonic polyps: Secondary | ICD-10-CM | POA: Insufficient documentation

## 2017-08-22 DIAGNOSIS — F5221 Male erectile disorder: Secondary | ICD-10-CM | POA: Diagnosis not present

## 2017-08-22 DIAGNOSIS — I2510 Atherosclerotic heart disease of native coronary artery without angina pectoris: Secondary | ICD-10-CM | POA: Diagnosis not present

## 2017-08-22 DIAGNOSIS — Z1211 Encounter for screening for malignant neoplasm of colon: Secondary | ICD-10-CM | POA: Diagnosis present

## 2017-08-22 DIAGNOSIS — Z7902 Long term (current) use of antithrombotics/antiplatelets: Secondary | ICD-10-CM | POA: Diagnosis not present

## 2017-08-22 DIAGNOSIS — Z951 Presence of aortocoronary bypass graft: Secondary | ICD-10-CM | POA: Diagnosis not present

## 2017-08-22 DIAGNOSIS — Z79899 Other long term (current) drug therapy: Secondary | ICD-10-CM | POA: Insufficient documentation

## 2017-08-22 DIAGNOSIS — E782 Mixed hyperlipidemia: Secondary | ICD-10-CM | POA: Diagnosis not present

## 2017-08-22 DIAGNOSIS — K640 First degree hemorrhoids: Secondary | ICD-10-CM | POA: Insufficient documentation

## 2017-08-22 DIAGNOSIS — I10 Essential (primary) hypertension: Secondary | ICD-10-CM | POA: Insufficient documentation

## 2017-08-22 HISTORY — DX: Personal history of urinary calculi: Z87.442

## 2017-08-22 HISTORY — PX: COLONOSCOPY WITH PROPOFOL: SHX5780

## 2017-08-22 SURGERY — COLONOSCOPY WITH PROPOFOL
Anesthesia: General

## 2017-08-22 MED ORDER — LIDOCAINE HCL (PF) 2 % IJ SOLN
INTRAMUSCULAR | Status: AC
Start: 2017-08-22 — End: 2017-08-22
  Filled 2017-08-22: qty 10

## 2017-08-22 MED ORDER — EPHEDRINE 5 MG/ML INJ
10.0000 mg | Freq: Once | INTRAVENOUS | Status: AC
  Administered 2017-08-22: 10 mg via INTRAVENOUS
  Filled 2017-08-22: qty 2

## 2017-08-22 MED ORDER — LIDOCAINE 2% (20 MG/ML) 5 ML SYRINGE
INTRAMUSCULAR | Status: DC | PRN
  Administered 2017-08-22: 30 mg via INTRAVENOUS

## 2017-08-22 MED ORDER — PROPOFOL 500 MG/50ML IV EMUL
INTRAVENOUS | Status: DC | PRN
  Administered 2017-08-22: 140 ug/kg/min via INTRAVENOUS

## 2017-08-22 MED ORDER — SODIUM CHLORIDE 0.9 % IV SOLN
INTRAVENOUS | Status: DC
  Administered 2017-08-22 (×2): via INTRAVENOUS

## 2017-08-22 MED ORDER — EPHEDRINE SULFATE 50 MG/ML IJ SOLN
INTRAMUSCULAR | Status: DC | PRN
  Administered 2017-08-22: 10 mg via INTRAVENOUS

## 2017-08-22 MED ORDER — PROPOFOL 10 MG/ML IV BOLUS
INTRAVENOUS | Status: AC
  Filled 2017-08-22: qty 20

## 2017-08-22 MED ORDER — PROPOFOL 500 MG/50ML IV EMUL
INTRAVENOUS | Status: AC
  Filled 2017-08-22: qty 50

## 2017-08-22 MED ORDER — FENTANYL CITRATE (PF) 100 MCG/2ML IJ SOLN
INTRAMUSCULAR | Status: DC | PRN
  Administered 2017-08-22: 50 ug via INTRAVENOUS

## 2017-08-22 MED ORDER — FENTANYL CITRATE (PF) 100 MCG/2ML IJ SOLN
INTRAMUSCULAR | Status: AC
  Filled 2017-08-22: qty 2

## 2017-08-22 MED ORDER — PROPOFOL 10 MG/ML IV BOLUS
INTRAVENOUS | Status: DC | PRN
  Administered 2017-08-22: 100 mg via INTRAVENOUS

## 2017-08-22 NOTE — Anesthesia Postprocedure Evaluation (Signed)
Anesthesia Post Note  Patient: [name]  Procedure(s) Performed: COLONOSCOPY WITH PROPOFOL (N/A )  Patient location during evaluation: Endoscopy Anesthesia Type: General Level of consciousness: awake and alert Pain management: pain level controlled Vital Signs Assessment: post-procedure vital signs reviewed and stable Respiratory status: spontaneous breathing, nonlabored ventilation, respiratory function stable and patient connected to nasal cannula oxygen Cardiovascular status: blood pressure returned to baseline and stable Postop Assessment: no apparent nausea or vomiting Anesthetic complications: no     Last Vitals:  Vitals:   08/22/17 1353 08/22/17 1403  BP: 115/72 (!) 107/52  Pulse: 60 (!) 59  Resp: 14 18  Temp:    SpO2: 95% 99%    Last Pain:  Vitals:   08/22/17 1333  TempSrc: Tympanic                 Pierce Barocio Garry Heater Gar Glance

## 2017-08-22 NOTE — Op Note (Signed)
[facility] Gastroenterology Patient Name: [name] Procedure Date: 08/22/2017 12:52 PM MRN: [id number] Account #: [id number] Date of Birth: 01/26/1941 Admit Type: Outpatient Age: [age] Room: [facility] ENDO ROOM 4 Gender: Male Note Status: Finalized Procedure:            Colonoscopy Indications:          High risk colon cancer surveillance: Personal history                        of colonic polyps Providers:            Boykin Nearingeodoro K. Jonnette Nuon MD, MD Medicines:            Propofol per Anesthesia Complications:        No immediate complications. Procedure:            Pre-Anesthesia Assessment:                       - The risks and benefits of the procedure and the                        sedation options and risks were discussed with the                        patient. All questions were answered and informed                        consent was obtained.                       - Patient identification and proposed procedure were                        verified prior to the procedure by the nurse. The                        procedure was verified in the procedure room.                       - ASA Grade Assessment: III - A patient with severe                        systemic disease.                       - After reviewing the risks and benefits, the patient                        was deemed in satisfactory condition to undergo the                        procedure.                       After obtaining informed consent, the colonoscope was                        passed under direct vision. Throughout the procedure,                        the patient's blood pressure, pulse, and oxygen  saturations were monitored continuously. The                        Colonoscope was introduced through the anus and                        advanced to the the cecum, identified by appendiceal                        orifice and ileocecal valve. The colonoscopy was             performed without difficulty. The patient tolerated the                        procedure well. The quality of the bowel preparation                        was good. The ileocecal valve, appendiceal orifice, and                        rectum were photographed. Findings:      The perianal and digital rectal examinations were normal. Pertinent       negatives include normal sphincter tone and no palpable rectal lesions.      The colon (entire examined portion) appeared normal.      Non-bleeding internal hemorrhoids were found during retroflexion. The       hemorrhoids were Grade I (internal hemorrhoids that do not prolapse). Impression:           - The entire examined colon is normal.                       - Non-bleeding internal hemorrhoids.                       - No specimens collected. Recommendation:       - Patient has a contact number available for                        emergencies. The signs and symptoms of potential                        delayed complications were discussed with the patient.                        Return to normal activities tomorrow. Written discharge                        instructions were provided to the patient.                       - Resume previous diet.                       - Continue present medications.                       - Resume Plavix (clopidogrel) at prior dose today.                        Refer to managing physician for further adjustment of  therapy.                       - No repeat colonoscopy due to age.                       - Return to GI office PRN. Procedure Code(s):    --- Professional ---                       Z6109, Colorectal cancer screening; colonoscopy on                        individual at high risk Diagnosis Code(s):    --- Professional ---                       Z86.010, Personal history of colonic polyps                       K64.0, First degree hemorrhoids CPT copyright 2016 American  Medical Association. All rights reserved. The codes documented in this report are preliminary and upon coder review may  be revised to meet current compliance requirements. Stanton Kidney MD, MD 08/22/2017 1:29:15 PM This report has been signed electronically. Number of Addenda: 0 Note Initiated On: 08/22/2017 12:52 PM Scope Withdrawal Time: 0 hours 8 minutes 5 seconds  Total Procedure Duration: 0 hours 15 minutes 46 seconds       [facility]

## 2017-08-22 NOTE — Transfer of Care (Signed)
Immediate Anesthesia Transfer of Care Note  Patient: [name]  Procedure(s) Performed: COLONOSCOPY WITH PROPOFOL (N/A )  Patient Location: PACU  Anesthesia Type:General  Level of Consciousness: awake, alert  and oriented  Airway & Oxygen Therapy: Patient Spontanous Breathing  Post-op Assessment: Report given to RN  Post vital signs: Reviewed  Last Vitals:  Vitals:   08/22/17 1303 08/22/17 1333  BP:  (!) 71/45  Pulse:  (!) 56  Resp: 18 (!) 21  Temp:  (!) 36.1 C  SpO2:  99%    Last Pain:  Vitals:   08/22/17 1333  TempSrc: Tympanic         Complications: No apparent anesthesia complications

## 2017-08-22 NOTE — Anesthesia Preprocedure Evaluation (Signed)
Anesthesia Evaluation  Patient identified by MRN, date of birth, ID band Patient awake    Reviewed: Allergy & Precautions, H&P , NPO status , reviewed documented beta blocker date and time   Airway Mallampati: III  TM Distance: >3 FB     Dental  (+) Caps, Dental Advidsory Given   Pulmonary shortness of breath and with exertion,    Pulmonary exam normal        Cardiovascular hypertension, On Medications + angina + CAD and + CABG  Normal cardiovascular exam     Neuro/Psych PSYCHIATRIC DISORDERS    GI/Hepatic   Endo/Other    Renal/GU Renal disease     Musculoskeletal   Abdominal   Peds  Hematology   Anesthesia Other Findings Hx renal stones, 1st deg AVb  Reproductive/Obstetrics                             Anesthesia Physical Anesthesia Plan  ASA: III  Anesthesia Plan: General   Post-op Pain Management:    Induction:   PONV Risk Score and Plan: 2 and Propofol infusion  Airway Management Planned:   Additional Equipment:   Intra-op Plan:   Post-operative Plan:   Informed Consent: I have reviewed the patients History and Physical, chart, labs and discussed the procedure including the risks, benefits and alternatives for the proposed anesthesia with the patient or authorized representative who has indicated his/her understanding and acceptance.   Dental Advisory Given  Plan Discussed with: CRNA  Anesthesia Plan Comments:         Anesthesia Quick Evaluation

## 2017-08-22 NOTE — Anesthesia Post-op Follow-up Note (Signed)
Anesthesia QCDR form completed.        

## 2017-08-22 NOTE — Interval H&P Note (Signed)
History and Physical Interval Note:  08/22/2017 12:57 PM  Douglas MilesHerman C Dragon  has presented today for surgery, with the diagnosis of SCREEN  The various methods of treatment have been discussed with the patient and family. After consideration of risks, benefits and other options for treatment, the patient has consented to  Procedure(s): COLONOSCOPY WITH PROPOFOL (N/A) as a surgical intervention .  The patient's history has been reviewed, patient examined, no change in status, stable for surgery.  I have reviewed the patient's chart and labs.  Questions were answered to the patient's satisfaction.     [place], [place]

## 2017-08-22 NOTE — H&P (Signed)
Outpatient short stay form Pre-procedure 08/22/2017 12:55 PM Teodoro K. Norma Fredricksonoledo, M.D.  Primary Physician: Aram BeechamJeffrey Sparks, M.D.  Reason for visit:  Personal hx of colon polyps  History of present illness:  Patient presents for colonoscopy for colon polyp surveillance. The patient denies complaints of abdominal pain, significant change in bowel habits, or rectal bleeding.     Current Facility-Administered Medications:  .  0.9 %  sodium chloride infusion, , Intravenous, Continuous, Hendersonoledo, Boykin Nearingeodoro K, MD, Last Rate: 20 mL/hr at 08/22/17 1222  Medications Prior to Admission  Medication Sig Dispense Refill Last Dose  . aspirin 81 MG tablet Take 81 mg by mouth daily.     Past Week at Unknown time  . fexofenadine (ALLEGRA) 180 MG tablet Take 180 mg by mouth 2 (two) times a week.   Taking  . losartan (COZAAR) 25 MG tablet Take 1 tablet (25 mg total) by mouth daily. 90 tablet 3 08/22/2017 at Unknown time  . metoprolol tartrate (LOPRESSOR) 25 MG tablet TAKE 1/2 TABLET BY MOUTH 2 TIMES A DAY 90 tablet 3 08/22/2017 at 0530  . simvastatin (ZOCOR) 40 MG tablet Take 1 tablet (40 mg total) by mouth daily. 90 tablet 3 08/22/2017 at Unknown time  . clopidogrel (PLAVIX) 75 MG tablet Take 1 tablet (75 mg total) by mouth daily. 90 tablet 3 08/16/2017  . furosemide (LASIX) 20 MG tablet Take 1 tablet (20 mg total) by mouth daily as needed. 90 tablet 3      Allergies  Allergen Reactions  . Codeine     REACTION: Vomiting     Past Medical History:  Diagnosis Date  . Arthritis   . CAD (coronary artery disease)    a. s/p DES to LAD and RCA (2001/2005);  b. 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  . ED (erectile dysfunction) of non-organic origin   . Essential hypertension   . History of kidney stones   . History of pneumonia   . Hyperlipidemia, mixed   . Kidney stones   . Leg cramps   . Myocardial infarction (HCC) 2000  . Post-operative Atrial Fibrillation     Review of systems:       Physical Exam  General appearance: alert, cooperative and appears older than stated age Resp: clear to auscultation bilaterally Cardio: regular rate and rhythm, S1, S2 normal, no murmur, click, rub or gallop GI: soft, non-tender; bowel sounds normal; no masses,  no organomegaly Extremities: extremities normal, atraumatic, no cyanosis or edema     Planned procedures: Colonoscopy. The patient understands the nature of the planned procedure, indications, risks, alternatives and potential complications including but not limited to bleeding, infection, perforation, damage to internal organs and possible oversedation/side effects from anesthesia. The patient agrees and gives consent to proceed.  Please refer to procedure notes for findings, recommendations and patient disposition/instructions.    Teodoro K. Norma Fredricksonoledo, M.D. Gastroenterology 08/22/2017  12:55 PM

## 2017-08-23 ENCOUNTER — Encounter: Payer: Self-pay | Admitting: Internal Medicine

## 2018-08-05 ENCOUNTER — Ambulatory Visit: Payer: Medicare Other | Admitting: Cardiovascular Disease

## 2018-08-15 ENCOUNTER — Other Ambulatory Visit: Payer: Self-pay | Admitting: Cardiovascular Disease

## 2018-08-21 ENCOUNTER — Encounter: Payer: Self-pay | Admitting: Nurse Practitioner

## 2018-08-21 ENCOUNTER — Ambulatory Visit: Payer: Medicare Other | Admitting: Nurse Practitioner

## 2018-08-21 VITALS — BP 140/72 | HR 64 | Ht 72.0 in | Wt 228.0 lb

## 2018-08-21 DIAGNOSIS — I10 Essential (primary) hypertension: Secondary | ICD-10-CM

## 2018-08-21 DIAGNOSIS — E785 Hyperlipidemia, unspecified: Secondary | ICD-10-CM

## 2018-08-21 DIAGNOSIS — I2510 Atherosclerotic heart disease of native coronary artery without angina pectoris: Secondary | ICD-10-CM | POA: Diagnosis not present

## 2018-08-21 NOTE — Progress Notes (Signed)
Office Visit    Patient Name: [name] Date of Encounter: 08/21/2018  Primary Care Provider:  Marguarite Arbour, MD Primary Cardiologist:  Julien Nordmann, MD  Chief Complaint    78 year old male with a history of CAD status post coronary artery bypass grafting in 2016, hypertension, hyperlipidemia, and arthritis, who presents for follow-up related to CAD.  Past Medical History    Past Medical History:  Diagnosis Date  . Arthritis   . CAD (coronary artery disease)    a. s/p DES to LAD and RCA (2001/2005);  b. 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  . ED (erectile dysfunction) of non-organic origin   . Essential hypertension   . History of kidney stones   . History of pneumonia   . Hyperlipidemia, mixed   . Kidney stones   . Leg cramps   . Myocardial infarction (HCC) 2000  . Post-operative atrial fibrillation    a. 04/2015 following CABG.   Past Surgical History:  Procedure Laterality Date  . BACK SURGERY     2 lunbar surgeries  . CARDIAC CATHETERIZATION N/A 04/30/2015   Procedure: Left Heart Cath;  Surgeon: Antonieta Iba, MD;  Location: [facility] INV[facility]IVE [facility];  Service: Cardiovascular;  Laterality: N/A;  . COLONOSCOPY WITH PROPOFOL N/A 08/22/2017   Procedure: COLONOSCOPY WITH PROPOFOL;  Surgeon: Toledo, Boykin Nearing, MD;  Location: [facility] ENDOSCOPY;  Service: Gastroenterology;  Laterality: N/A;  . CORONARY ANGIOPLASTY     3 stents  . CORONARY ARTERY BYPASS GRAFT N/A 05/10/2015   Procedure: CORONARY ARTERY BYPASS GRAFTING (CABG), ON PUMP, TIMES FOUR, USING LEFT INTERNAL MAMMARY ARTERY, BILATERAL GREATER SAPHENOUS VEINS HARVESTED ENDOSCOPICALLY, WITH CORONARY ENDARTERECTOMY;  Surgeon: Kerin Perna, MD;  Location: [facility] OR;  Service: Open Heart Surgery;  Laterality: N/A;  . RETINAL DETACHMENT SURGERY Left   . TEE WITHOUT CARDIOVERSION N/A 05/10/2015   Procedure: TRANSESOPHAGEAL ECHOCARDIOGRAM (TEE);  Surgeon: Kerin Perna, MD;  Location: [facility] OR;   Service: Open Heart Surgery;  Laterality: N/A;    Allergies  Allergies  Allergen Reactions  . Codeine     REACTION: Vomiting  . Lisinopril     Cough     History of Present Illness    78 year old male with the above complex past medical history including coronary artery disease status post prior stenting of the LAD and RCA in early 2000's.  In 2016, he had recurrent chest pain and dyspnea underwent stress testing which was abnormal.  Catheterization revealed severe multivessel CAD and he subsequently underwent CABG x4.  Postoperative course was complicated by mild anemia, mild thrombocytopenia, and paroxysmal atrial fibrillation requiring a short course of amiodarone.  His other history includes hypertension, hyperlipidemia, nephrolithiasis, and arthritis.  He was last seen in clinic in January 2019 and notes that from a cardiac standpoint, he has done well over the past year.  He was having significant back pain and has required lower back injections.  In the setting of back pain, his exercise routine was thrown off quite a bit and he went from walking about 3 miles a day on his treadmill to not being able to do anything.  At this point, he is slowly working back up his activity tolerance and is currently doing 1-1/2 miles daily on his treadmill.  In so doing, he says he does not ever experience chest pain or dyspnea.  Further, he denies palpitations, PND, orthopnea, dizziness, syncope, edema, or early satiety.  Home Medications    Prior to  Admission medications   Medication Sig Start Date End Date Taking? Authorizing Provider  aspirin 81 MG tablet Take 81 mg by mouth daily.      [provider]  clopidogrel (PLAVIX) 75 MG tablet Take 1 tablet (75 mg total) by mouth daily. 07/31/17   Antonieta Iba, MD  fexofenadine (ALLEGRA) 180 MG tablet Take 180 mg by mouth 2 (two) times a week.    [provider]  furosemide (LASIX) 20 MG tablet TAKE 1 TABLET BY MOUTH EVERY DAY AS  NEEDED 08/15/18   Antonieta Iba, MD  losartan (COZAAR) 25 MG tablet Take 1 tablet (25 mg total) by mouth daily. 07/31/17 10/29/17  Antonieta Iba, MD  metoprolol tartrate (LOPRESSOR) 25 MG tablet TAKE 1/2 TABLET BY MOUTH 2 TIMES A DAY 07/31/17   Antonieta Iba, MD  simvastatin (ZOCOR) 40 MG tablet Take 1 tablet (40 mg total) by mouth daily. 07/31/17   Antonieta Iba, MD    Review of Systems    Sidelined by back pain late last year but this has improved.  He is now walking 1-1/2 miles daily.  He denies chest pain, palpitations, dyspnea, PND, orthopnea, dizziness, syncope, edema, or early satiety.  All other systems reviewed and are otherwise negative except as noted above.  Physical Exam    VS:  BP 140/72 (BP Location: Left Arm, Patient Position: Sitting, Cuff Size: Normal)   Pulse 64   Ht 6' (1.829 m)   Wt 228 lb (103.4 kg)   BMI 30.92 kg/m  , BMI Body mass index is 30.92 kg/m. GEN: Well nourished, well developed, in no acute distress. HEENT: normal. Neck: Supple, no JVD, carotid bruits, or masses. Cardiac: RRR, 2/6 systolic ejection murmur loudest at the upper sternal borders, no rubs, or gallops. No clubbing, cyanosis, edema.  Radials/PT 2+ and equal bilaterally.  Respiratory:  Respirations regular and unlabored, clear to auscultation bilaterally. GI: Soft, nontender, nondistended, BS + x 4. MS: no deformity or atrophy. Skin: warm and dry, no rash. Neuro:  Strength and sensation are intact. Psych: Normal affect.  Accessory Clinical Findings    ECG personally reviewed by me today -regular sinus rhythm, first-degree AV block, 64, left axis deviation, left anterior fascicular block- no acute changes.  Assessment & Plan    1.  Coronary artery disease: Status post CABG x4 in 2016.  He has been doing well over the past year from a cardiac standpoint and continues to walk on his treadmill daily without symptoms or limitations.  He remains on aspirin, Plavix, beta-blocker, and  ARB therapy.  Of note, his medicine list includes lisinopril 2.5 mg daily.  He has this on his own personal list as well though he is not sure if he is actually taking it.  I have asked him to check his medication bottles at home as my understanding is that lisinopril was discontinued secondary to cough and he should only be taking losartan at this point.  He is going to call us back.  2.  Essential hypertension: Stable on current regimen.  See above regarding lisinopril.  3.  Hyperlipidemia: LDL at goal of 68 in October 2019, with normal LFTs at that time.  Continue statin therapy.  4.  Disposition: Patient will contact us to clarify whether or not he is currently taking lisinopril as we had thought he stopped this.  Otherwise, plan to follow-up in 1 year or sooner if necessary.   Nicolasa Ducking, NP 08/21/2018, 4:28 PM

## 2018-08-21 NOTE — Patient Instructions (Signed)
Medication Instructions:  Your physician recommends that you continue on your current medications as directed. Please refer to the Current Medication list given to you today. ### Please call to confirm current medications you are taking.  If you need a refill on your cardiac medications before your next appointment, please call your pharmacy.   Lab work: None ordered   If you have labs (blood work) drawn today and your tests are completely normal, you will receive your results only by: Marland Kitchen MyChart Message (if you have MyChart) OR . A paper copy in the mail If you have any lab test that is abnormal or we need to change your treatment, we will call you to review the results.  Testing/Procedures: None ordered   Follow-Up: At [facility], you and your health needs are our priority.  As part of our continuing mission to provide you with exceptional heart care, we have created designated Provider Care Teams.  These Care Teams include your primary Cardiologist (physician) and Advanced Practice Providers (APPs -  Physician Assistants and Nurse Practitioners) who all work together to provide you with the care you need, when you need it. You will need a follow up appointment in 1 years.  Please call our office 2 months in advance to schedule this appointment.  You may see Julien Nordmann, MD or Nicolasa Ducking, NP

## 2018-09-05 ENCOUNTER — Telehealth: Payer: Self-pay | Admitting: Cardiovascular Disease

## 2018-09-05 ENCOUNTER — Other Ambulatory Visit: Payer: Self-pay | Admitting: *Deleted

## 2018-09-05 MED ORDER — SIMVASTATIN 40 MG PO TABS: 40.0000 mg | ORAL_TABLET | Freq: Every day | ORAL | 3 refills | Status: DC

## 2018-09-05 NOTE — Telephone Encounter (Signed)
Requested Prescriptions   Signed Prescriptions Disp Refills  . simvastatin (ZOCOR) 40 MG tablet 90 tablet 3    Sig: Take 1 tablet (40 mg total) by mouth daily.    Authorizing Provider: GOLLAN, TIMOTHY J    Ordering User: LOPEZ, MARINA C    

## 2018-09-05 NOTE — Telephone Encounter (Signed)
°*  STAT* If patient is at the pharmacy, call can be transferred to refill team.   1. Which medications need to be refilled? (please list name of each medication and dose if known) simvastatin 40 MG 1 tablet daily   2. Which pharmacy/location (including street and city if local pharmacy) is medication to be sent to? [facility] [place]   3. Do they need a 30 day or 90 day supply? 90 day

## 2018-09-05 NOTE — Telephone Encounter (Signed)
Requested Prescriptions   Signed Prescriptions Disp Refills  . simvastatin (ZOCOR) 40 MG tablet 90 tablet 3    Sig: Take 1 tablet (40 mg total) by mouth daily.    Authorizing Provider: Antonieta Iba    Ordering User: Kendrick Fries

## 2018-09-09 ENCOUNTER — Other Ambulatory Visit: Payer: Self-pay | Admitting: Cardiovascular Disease

## 2018-09-12 ENCOUNTER — Telehealth: Payer: Self-pay | Admitting: Cardiovascular Disease

## 2018-09-12 ENCOUNTER — Other Ambulatory Visit: Payer: Self-pay | Admitting: *Deleted

## 2018-09-12 MED ORDER — CLOPIDOGREL BISULFATE 75 MG PO TABS: 75.0000 mg | ORAL_TABLET | Freq: Every day | ORAL | 3 refills | Status: DC

## 2018-09-12 MED ORDER — METOPROLOL TARTRATE 25 MG PO TABS: ORAL_TABLET | ORAL | 3 refills | Status: DC

## 2018-09-12 NOTE — Telephone Encounter (Signed)
Requested Prescriptions   Signed Prescriptions Disp Refills  . metoprolol tartrate (LOPRESSOR) 25 MG tablet 90 tablet 3    Sig: TAKE 1/2 TABLET BY MOUTH 2 TIMES A DAY    Authorizing Provider: Antonieta Iba    Ordering User: Jamario Colina C  . clopidogrel (PLAVIX) 75 MG tablet 90 tablet 3    Sig: Take 1 tablet (75 mg total) by mouth daily.    Authorizing Provider: Antonieta Iba    Ordering User: Kendrick Fries

## 2018-09-12 NOTE — Telephone Encounter (Signed)
Requested Prescriptions   Signed Prescriptions Disp Refills  . metoprolol tartrate (LOPRESSOR) 25 MG tablet 90 tablet 3    Sig: TAKE 1/2 TABLET BY MOUTH 2 TIMES A DAY    Authorizing Provider: GOLLAN, TIMOTHY J    Ordering User: LOPEZ, MARINA C  . clopidogrel (PLAVIX) 75 MG tablet 90 tablet 3    Sig: Take 1 tablet (75 mg total) by mouth daily.    Authorizing Provider: GOLLAN, TIMOTHY J    Ordering User: LOPEZ, MARINA C    

## 2018-09-12 NOTE — Telephone Encounter (Signed)
°*  STAT* If patient is at the pharmacy, call can be transferred to refill team.   1. Which medications need to be refilled? (please list name of each medication and dose if known)  Clopidogrel (PLAVIX) 75 MG - 1 tablet daily  Metoprolol tartrate (LOPRESSOR) 25 MG 1/2 tablet 2 times daily   2. Which pharmacy/location (including street and city if local pharmacy) is medication [facility] sent to[facility] [place]  3. Do they need a 30 day or 90 day supply? 90 day

## 2019-08-20 ENCOUNTER — Other Ambulatory Visit: Payer: Self-pay | Admitting: Cardiovascular Disease

## 2019-08-25 NOTE — Progress Notes (Signed)
Cardiology Office Note  Date:  08/26/2019   ID:  [name], DOB 08/05/1940, MRN [id number]  PCP:  Idelle Crouch, MD   Chief Complaint  Patient presents with  . office visit    12 mo F/U; Meds verbally reviewed with patient.    HPI:  [name] is a very pleasant 79 year old male with a history of coronary artery disease,  stenting of the LAD and right coronary artery with drug-eluting stents. Catheterization in April 2005, which showed moderate diffuse nonobstructive disease.   bypass surgery x 4 at [facility] 05/2015 For severe 3 vessel dz postoperative atrial fibrillation hypertension,  hyperlipidemia,  kidney stones Never smoked Hx of bronchitis He presents today for follow-up of his coronary artery disease  This past year has had arthritis problems, legs No angina with exertion Waking with anxiety, "panic", 1 week or so Walking 1 1/2 miles a day, "fast walk" Denies SOB, chest pain Previously walking 3 miles,  limited by back, better  Still works, does Midwife   fell off treadmill last year 2018 Thought he may have broken a rib  Having some knee pain  denies any shortness of breath on exertion Uses his treadmill Used to do 20 miles a week, now not doing as much   previously stopped lisinopril for cough He also held his losartan. Thought it caused a cough (bronchitis)  Used to be on lasix, stopped as no edema No edema on today's visit  Lab work reviewed with him Total chol 123, no new labs since 2019  EKG personally reviewed by myself on todays visit shows normal sinus rhythm with rate 63 bpm, no significant ST or T-wave changes, left anterior fascicular block  Other past medical history He is been seen by pulmonary, had CT scan 02/08/2015, showing mild interstitial change  positive stress test (anterior wall ) at the end of 2016 (for chest pain and shortness of breath) leading to cardiac catheterization showing three-vessel disease, severe  ostial LAD disease Sent to [facility] for a CABG He had postoperative atrial fibrillation  Myoview in April 2008 which showed a 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 EF 54%.  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  he is working full-time job.   PMH:   has a past medical history of Arthritis, CAD (coronary artery disease), ED (erectile dysfunction) of non-organic origin, Essential hypertension, History of kidney stones, History of pneumonia, Hyperlipidemia, mixed, Kidney stones, Leg cramps, Myocardial infarction ([place]) (2000), and Post-operative atrial fibrillation.  PSH:    Past Surgical History:  Procedure Laterality Date  . BACK SURGERY     2 lunbar surgeries  . CARDIAC CATHETERIZATION N/A 04/30/2015   Procedure: Left Heart Cath;  Surgeon: Minna Merritts, MD;  Location: [facility];  Service: Cardiovascular;  Laterality: N/A;  . COLONOSCOPY WITH PROPOFOL N/A 08/22/2017   Procedure: COLONOSCOPY WITH PROPOFOL;  Surgeon: Toledo, Benay Pike, MD;  Location: [facility] ENDOSCOPY;  Service: Gastroenterology;  Laterality: N/A;  . CORONARY ANGIOPLASTY     3 stents  . CORONARY ARTERY BYPASS GRAFT N/A 05/10/2015   Procedure: CORONARY ARTERY BYPASS GRAFTING (CABG), ON PUMP, TIMES FOUR, USING LEFT INTERNAL MAMMARY ARTERY, BILATERAL GREATER SAPHENOUS VEINS HARVESTED ENDOSCOPICALLY, WITH CORONARY ENDARTERECTOMY;  Surgeon: Ivin Poot, MD;  Location: [facility];  Service: Open Heart Surgery;  Laterality:  N/A;  . RETINAL DETACHMENT SURGERY Left   . TEE WITHOUT CARDIOVERSION N/A 05/10/2015   Procedure: TRANSESOPHAGEAL ECHOCARDIOGRAM (TEE);  Surgeon: Kerin Perna, MD;  Location: [facility] OR;  Service: Open Heart Surgery;  Laterality: N/A;  . TOOTH EXTRACTION      Current Outpatient Medications  Medication Sig Dispense Refill   . aspirin 81 MG tablet Take 81 mg by mouth daily.      . clopidogrel (PLAVIX) 75 MG tablet Take 1 tablet (75 mg total) by mouth daily. 90 tablet 3  . fexofenadine (ALLEGRA) 180 MG tablet Take 180 mg by mouth as needed.     Marland Kitchen losartan (COZAAR) 25 MG tablet TAKE 1 TABLET BY MOUTH EVERY DAY 90 tablet 3  . metoprolol tartrate (LOPRESSOR) 25 MG tablet TAKE 1/2 TABLET BY MOUTH 2 TIMES A DAY 90 tablet 3  . simvastatin (ZOCOR) 40 MG tablet Take 1 tablet (40 mg total) by mouth daily. 90 tablet 3  . lisinopril (PRINIVIL,ZESTRIL) 2.5 MG tablet Take 2.5 mg by mouth daily.     No current facility-administered medications for this visit.     Allergies:   Codeine and Lisinopril   Social History:  The patient  reports that he has never smoked. He has never used smokeless tobacco. He reports that he does not drink alcohol or use drugs.   Family History:   Family history is unknown by patient.    Review of Systems: Review of Systems  Constitutional: Negative.   HENT: Negative.   Respiratory: Negative.   Cardiovascular: Negative.   Gastrointestinal: Negative.   Musculoskeletal: Positive for back pain and joint pain.  Neurological: Negative.   Psychiatric/Behavioral: Negative.   All other systems reviewed and are negative.    PHYSICAL EXAM: VS:  BP 140/72 (BP Location: Left Arm, Patient Position: Sitting, Cuff Size: Normal)   Pulse 63   Ht 6\' 1"  (1.854 m)   Wt 228 lb 8 oz (103.6 kg)   SpO2 96%   BMI 30.15 kg/m  , BMI Body mass index is 30.15 kg/m. Constitutional:  oriented to person, place, and time. No distress.  HENT:  Head: Grossly normal Eyes:  no discharge. No scleral icterus.  Neck: No JVD, no carotid bruits  Cardiovascular: Regular rate and rhythm, no murmurs appreciated Pulmonary/Chest: Clear to auscultation bilaterally, no wheezes or rails Abdominal: Soft.  no distension.  no tenderness.  Musculoskeletal: Normal range of motion Neurological:  normal muscle tone. Coordination  normal. No atrophy Skin: Skin warm and dry Psychiatric: normal affect, pleasant   Recent Labs: No results found for requested labs within last 8760 hours.    Lipid Panel No results found for: CHOL, HDL, LDLCALC, TRIG    Wt Readings from Last 3 Encounters:  08/26/19 228 lb 8 oz (103.6 kg)  08/21/18 228 lb (103.4 kg)  08/22/17 218 lb 8 oz (99.1 kg)      ASSESSMENT AND PLAN:  Essential hypertension -  [facility] dosing They report he is taking metoprolol tartrate 12.5 twice daily He prefers to change his metoprolol to once a day We will change him to metoprolol succinate 25 mg daily Continue losartan 25 daily Recommend he monitor blood pressure at home  Atherosclerosis of native coronary artery of native heart with angina pectoris (HCC) - Plan: EKG 12-Lead Currently with no symptoms of angina. No further workup at this time. Continue current medication regimen.  Hyperlipidemia LDL goal <70 - Plan: EKG 12-Lead Primary care records  reviewed from [facility] No recent lipid panel, continue simvastatin Weight up 10 pounds Repeat lab work with primary care on routine follow-up  S/P CABG x 4 - Plan: EKG 12-Lead Aspirin Plavix, cholesterol in the past at goal No anginal symptoms on exertion  Disposition:   F/U  12 months   Total encounter time more than 25 minutes  Greater than 50% was spent in counseling and coordination of care with the patient    Orders Placed This Encounter  Procedures  . EKG 12-Lead     Signed, Dossie Arbour, M.D., Ph.D. 08/26/2019  [facility] [place], [place] [phone]

## 2019-08-26 ENCOUNTER — Encounter: Payer: Self-pay | Admitting: Cardiovascular Disease

## 2019-08-26 ENCOUNTER — Other Ambulatory Visit: Payer: Self-pay

## 2019-08-26 ENCOUNTER — Ambulatory Visit (INDEPENDENT_AMBULATORY_CARE_PROVIDER_SITE_OTHER): Payer: Medicare Other | Admitting: Cardiovascular Disease

## 2019-08-26 VITALS — BP 140/72 | HR 63 | Ht 73.0 in | Wt 228.5 lb

## 2019-08-26 DIAGNOSIS — E785 Hyperlipidemia, unspecified: Secondary | ICD-10-CM | POA: Diagnosis not present

## 2019-08-26 DIAGNOSIS — I25118 Atherosclerotic heart disease of native coronary artery with other forms of angina pectoris: Secondary | ICD-10-CM | POA: Diagnosis not present

## 2019-08-26 DIAGNOSIS — I10 Essential (primary) hypertension: Secondary | ICD-10-CM

## 2019-08-26 MED ORDER — CLOPIDOGREL BISULFATE 75 MG PO TABS: 75.0000 mg | ORAL_TABLET | Freq: Every day | ORAL | 3 refills | Status: DC

## 2019-08-26 MED ORDER — LOSARTAN POTASSIUM 25 MG PO TABS: 25.0000 mg | ORAL_TABLET | Freq: Every day | ORAL | 3 refills | Status: DC

## 2019-08-26 MED ORDER — METOPROLOL SUCCINATE ER 25 MG PO TB24: 25.0000 mg | ORAL_TABLET | Freq: Every day | ORAL | 3 refills | Status: DC

## 2019-08-26 MED ORDER — SIMVASTATIN 40 MG PO TABS: 40.0000 mg | ORAL_TABLET | Freq: Every day | ORAL | 3 refills | Status: DC

## 2019-08-26 NOTE — Patient Instructions (Addendum)
Medication Instructions:  Your physician has recommended you make the following change in your medication:  1. STOP Metoprolol tartrate 2. START Metoprolol succinate 25 mg once daily   If you need a refill on your cardiac medications before your next appointment, please call your pharmacy.    Lab work: No new labs needed   If you have labs (blood work) drawn today and your tests are completely normal, you will receive your results only by: Marland Kitchen MyChart Message (if you have MyChart) OR . A paper copy in the mail If you have any lab test that is abnormal or we need to change your treatment, we will call you to review the results.   Testing/Procedures: No new testing needed   Follow-Up: At [facility], you and your health needs are our priority.  As part of our continuing mission to provide you with exceptional heart care, we have created designated Provider Care Teams.  These Care Teams include your primary Cardiologist (physician) and Advanced Practice Providers (APPs -  Physician Assistants and Nurse Practitioners) who all work together to provide you with the care you need, when you need it.  . You will need a follow up appointment in 12 months .   Please call our office 2 months in advance to schedule this appointment.    . Providers on your designated Care Team:   . Nicolasa Ducking, NP . Eula Listen, PA-C . Marisue Ivan, PA-C  Any Other Special Instructions Will Be Listed Below (If Applicable).  For educational health videos Log in to : [email or web address] Or : [email or web address], password : [email or web address]

## 2020-08-18 ENCOUNTER — Other Ambulatory Visit: Payer: Self-pay | Admitting: Cardiovascular Disease

## 2020-08-18 NOTE — Telephone Encounter (Signed)
Scheduled for 2/11

## 2020-08-18 NOTE — Telephone Encounter (Signed)
Please schedule 12 month F/U appointment. Thank you! 

## 2020-08-19 NOTE — Progress Notes (Signed)
Office Visit    Patient Name: JOSEHUA HAMMAR Date of Encounter: 08/20/2020  PCP:  Marguarite Arbour, MD   [facility]  Cardiologist:  Julien Nordmann, MD  Electrophysiologist:  None   Chief Complaint    MALIEK SCHELLHORN is a 80 y.o. male with a hx of CAD s/p CABG 2016, hypertension, hyperlipidemia, arthritis presents today for annual follow up of coronary artery disease with chief complaint of fatigue.    Past Medical History    Past Medical History:  Diagnosis Date  . Arthritis   . CAD (coronary artery disease)    a. s/p DES to LAD and RCA (2001/2005);  b. 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  . ED (erectile dysfunction) of non-organic origin   . Essential hypertension   . History of kidney stones   . History of pneumonia   . Hyperlipidemia, mixed   . Kidney stones   . Leg cramps   . Myocardial infarction (HCC) 2000  . Post-operative atrial fibrillation    a. 04/2015 following CABG.   Past Surgical History:  Procedure Laterality Date  . BACK SURGERY     2 lunbar surgeries  . CARDIAC CATHETERIZATION N/A 04/30/2015   Procedure: Left Heart Cath;  Surgeon: Antonieta Iba, MD;  Location: [facility] INVASIVE CV LAB;  Service: Cardiovascular;  Laterality: N/A;  . COLONOSCOPY WITH PROPOFOL N/A 08/22/2017   Procedure: COLONOSCOPY WITH PROPOFOL;  Surgeon: Toledo, Boykin Nearing, MD;  Location: [facility] ENDOSCOPY;  Service: Gastroenterology;  Laterality: N/A;  . CORONARY ANGIOPLASTY     3 stents  . CORONARY ARTERY BYPASS GRAFT N/A 05/10/2015   Procedure: CORONARY ARTERY BYPASS GRAFTING (CABG), ON PUMP, TIMES FOUR, USING LEFT INTERNAL MAMMARY ARTERY, BILATERAL GREATER SAPHENOUS VEINS HARVESTED ENDOSCOPICALLY, WITH CORONARY ENDARTERECTOMY;  Surgeon: Kerin Perna, MD;  Location: [facility] OR;  Service: Open Heart Surgery;  Laterality: N/A;  . RETINAL DETACHMENT SURGERY Left   . TEE WITHOUT CARDIOVERSION N/A 05/10/2015   Procedure: TRANSESOPHAGEAL  ECHOCARDIOGRAM (TEE);  Surgeon: Kerin Perna, MD;  Location: [facility] OR;  Service: Open Heart Surgery;  Laterality: N/A;  . TOOTH EXTRACTION      Allergies  Allergies  Allergen Reactions  . Codeine     REACTION: Vomiting  . Lisinopril     Cough     History of Present Illness    JAKELL TRUSTY is a 80 y.o. male with a hx of CAD s/p CABG 2016, hypertension, hyperlipidemia, arthritis last seen 08/26/19.  Coronary artery disease with prior stenting of LAD and RCA in early 2000's. In 2016 he had recurrent chest pain and dyspnea with abnormal stress test. Catheterization showed severe multivessel CAD and underwent CABGx4. Postoperative course complicated by mild anemia, mild thrombocytopenia, and paroxysmal atrial fibrillation requiring short course of amiodarone. Additional history includes hypertension, hyperlipidemia, nephrolithiasis, arthritis.   He was last seen in clinic 08/26/19 and doing well. His metoprolol tartrate was transitioned to metoprolol succinate for daily dosing.   Presents today for follow up. 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 Tells me he has not been exercising as much due to back pain.  Still doing moderate exercise by walking. No chest pain, pressure, tightness with exertion. Endorses increased dyspnea on exertion over the last year which he attributes to reduced exercise. No palpitations. Reports occasional lightheadedness with quick position changes. Does note  increase in fatigue over the last few months. Tells me his home blood pressure cuff has been averaging 145/80. On his home blood pressure cuff his heart rate is 58 bpm - 63 bpm.   EKGs/Labs/Other Studies Reviewed:   The following studies were reviewed today:  EKG:  EKG is ordered today.  The ekg ordered today demonstrates atrial fibrillation with RVR 115 bpm.   Recent Labs: No results found for requested labs  within last 8760 hours.  Recent Lipid Panel No results found for: CHOL, TRIG, HDL, CHOLHDL, VLDL, LDLCALC, LDLDIRECT  Risk Assessment/Calculations:   CHA2DS2-VASc Score = 4  This indicates a 4.8% annual risk of stroke. The patient's score is based upon: CHF History: No HTN History: Yes Diabetes History: No Stroke History: No Vascular Disease History: Yes Age Score: 2 Gender Score: 0   Home Medications   Current Meds  Medication Sig  . apixaban (ELIQUIS) 5 MG TABS tablet Take 1 tablet (5 mg total) by mouth 2 (two) times daily.  . clobetasol cream (TEMOVATE) 0.05 % SMARTSIG:1 Topical Every Night  . fexofenadine (ALLEGRA) 180 MG tablet Take 180 mg by mouth as needed.   . furosemide (LASIX) 20 MG tablet Take 20 mg by mouth daily as needed.  Marland Kitchen losartan (COZAAR) 25 MG tablet TAKE 1 TABLET BY MOUTH EVERY DAY  . simvastatin (ZOCOR) 40 MG tablet TAKE 1 TABLET BY MOUTH EVERY DAY  . [DISCONTINUED] aspirin 81 MG tablet Take 81 mg by mouth daily.  . [DISCONTINUED] clopidogrel (PLAVIX) 75 MG tablet TAKE 1 TABLET BY MOUTH EVERY DAY  . [DISCONTINUED] metoprolol succinate (TOPROL-XL) 25 MG 24 hr tablet TAKE 1 TABLET BY MOUTH EVERY DAY     Review of Systems  All other systems reviewed and are otherwise negative except as noted above.  Physical Exam    VS:  BP 118/70 (BP Location: Left Arm, Patient Position: Sitting, Cuff Size: Normal)   Pulse (!) 115   Ht 6\' 1"  (1.854 m)   Wt 231 lb (104.8 kg)   SpO2 98%   BMI 30.48 kg/m  , BMI Body mass index is 30.48 kg/m.  Wt Readings from Last 3 Encounters:  08/20/20 231 lb (104.8 kg)  08/26/19 228 lb 8 oz (103.6 kg)  08/21/18 228 lb (103.4 kg)    GEN: Well nourished, well developed, in no acute distress. HEENT: normal. Neck: Supple, no JVD, carotid bruits, or masses. Cardiac: irregularly irregular, tachycardic, no murmurs, rubs, or gallops. No clubbing, cyanosis, edema.  Radials/DP/PT 2+ and equal bilaterally.  Respiratory:  Respirations  regular and unlabored, clear to auscultation bilaterally. GI: Soft, nontender, nondistended. MS: No deformity or atrophy. Skin: Warm and dry, no rash. Neuro:  Strength and sensation are intact. Psych: Normal affect.  Assessment & Plan    1. Atrial fibrillation - Previously with postoperative atrial fib after CABG in 2016 managed with short course of Amiodarone. Now with recurrent atrial fibrillation. Unknown duration. Symptomatic with fatigue and exercise intolerance. Stop Aspirin, Plavix. Start Eliquis 5mg  BID due to CHADS2VASc of at least 3 (age, CAD, HTN). Increase Toprol to 50mg  daily to reduce rate, hopeful he will self convert to NSR.  Lab work today including TSH, BMP, CBC, magnesium. Plan for echocardiogram to assess for valvular disease and determine atria size. Follow up in 2 weeks for consideration of cardioversion vs antiarrhythmic.   2. HTN - BP well controlled. Continue current antihypertensive regimen.   3. CAD s/p CABG x4 - Reports no anginal symptoms. No indication for  ischemic evaluation. GDMT includes beta blocker, statin. Aspirin and Plavix discontinued today due to initiation of anticoagulation, as above.   4. HLD, LDL <70 - Continue Simvastatin 40mg  daily.    Disposition: Follow up in 2 week(s) with Dr. or APP   Signed, Mariah Milling, NP 08/20/2020, 4:42 PM [facility]

## 2020-08-20 ENCOUNTER — Ambulatory Visit: Payer: Medicare Other | Admitting: Family

## 2020-08-20 ENCOUNTER — Other Ambulatory Visit: Payer: Self-pay

## 2020-08-20 ENCOUNTER — Encounter: Payer: Self-pay | Admitting: Medical

## 2020-08-20 VITALS — BP 118/70 | HR 115 | Ht 73.0 in | Wt 231.0 lb

## 2020-08-20 DIAGNOSIS — I25118 Atherosclerotic heart disease of native coronary artery with other forms of angina pectoris: Secondary | ICD-10-CM | POA: Diagnosis not present

## 2020-08-20 DIAGNOSIS — I10 Essential (primary) hypertension: Secondary | ICD-10-CM

## 2020-08-20 DIAGNOSIS — Z7901 Long term (current) use of anticoagulants: Secondary | ICD-10-CM

## 2020-08-20 DIAGNOSIS — E785 Hyperlipidemia, unspecified: Secondary | ICD-10-CM | POA: Diagnosis not present

## 2020-08-20 DIAGNOSIS — I4891 Unspecified atrial fibrillation: Secondary | ICD-10-CM | POA: Diagnosis not present

## 2020-08-20 MED ORDER — METOPROLOL SUCCINATE ER 50 MG PO TB24: 50.0000 mg | ORAL_TABLET | Freq: Every day | ORAL | 5 refills | Status: DC

## 2020-08-20 MED ORDER — APIXABAN 5 MG PO TABS: 5.0000 mg | ORAL_TABLET | Freq: Two times a day (BID) | ORAL | 5 refills | Status: DC

## 2020-08-20 NOTE — Patient Instructions (Addendum)
Medication Instructions:  Your physician has recommended you make the following change in your medication:   CHANGE Toprol to 50mg  daily  STOP Aspirin   STOP Clopidogrel (Plavix)  START Eliquis 5mg  twice daily *This is a blood thinner to protect you from risk of stroke due to atrial fibrillation*  *If you need a refill on your cardiac medications before your next appointment, please call your pharmacy*  Lab Work: Your physician recommends lab work today: TSH, BMP, CBC, magnesium  If you have labs (blood work) drawn today and your tests are completely normal, you will receive your results only by: MyChart Message (if you have MyChart) OR . A paper copy in the mail If you have any lab test that is abnormal or we need to change your treatment, we will call you to review the results.   Testing/Procedures: Your EKG today shows atrial fibrillation. This is an irregular heart rhythm that originates in the top chambers of the heart. Additional information is below.  Your physician has requested that you have an echocardiogram. Echocardiography is a painless test that uses sound waves to create images of your heart. It provides your doctor with information about the size and shape of your heart and how well your heart's chambers and valves are working. This procedure takes approximately one hour. There are no restrictions for this procedure.  Follow-Up: At [facility], you and your health needs are our priority.  As part of our continuing mission to provide you with exceptional heart care, we have created designated Provider Care Teams.  These Care Teams include your primary Cardiologist (physician) and Advanced Practice Providers (APPs -  Physician Assistants and Nurse Practitioners) who all work together to provide you with the care you need, when you need it.  We recommend signing up for the patient portal called "MyChart".  Sign up information is provided on this After Visit Summary.   MyChart is used to connect with patients for Virtual Visits (Telemedicine).  Patients are able to view lab/test results, encounter notes, upcoming appointments, etc.  Non-urgent messages can be sent to your provider as well.   To learn more about what you can do with MyChart, go to Marland Kitchen.    Your next appointment:   2 week(s) ( To be scheduled after the echo)  The format for your next appointment:   In Person  Provider:   You may see [facility], MD or one of the following Advanced Practice Providers on your designated Care Team:    [email or web address], NP  Julien Nordmann, PA-C  Nicolasa Ducking, PA-C  Cadence Eula Listen, Marisue Ivan  Fransico Michael, NP  Other Instructions   Atrial Fibrillation  Atrial fibrillation is a type of heartbeat that is irregular or fast. If you have this condition, your heart beats without any order. This makes it hard for your heart to pump blood in a normal way. Atrial fibrillation may come and go, or it may become a long-lasting problem. If this condition is not treated, it can put you at higher risk for stroke, heart failure, and other heart problems. What are the causes? This condition may be caused by diseases that damage the heart. They include:  High blood pressure.  Heart failure.  Heart valve disease.  Heart surgery. Other causes include:  Diabetes.  Thyroid disease.  Being overweight.  Kidney disease. Sometimes the cause is not known. What increases the risk? You are more likely to develop this condition if:  You are older.  You smoke.  You exercise often and very hard.  You have a family history of this condition.  You are a man.  You use drugs.  You drink a lot of alcohol.  You have lung conditions, such as emphysema, pneumonia, or COPD.  You have sleep apnea. What are the signs or symptoms? Common symptoms of this condition include:  A feeling that your heart is beating very fast.  Chest pain or  discomfort.  Feeling short of breath.  Suddenly feeling light-headed or weak.  Getting tired easily during activity.  Fainting.  Sweating. In some cases, there are no symptoms. How is this treated? Treatment for this condition depends on underlying conditions and how you feel when you have atrial fibrillation. They include:  Medicines to: ? Prevent blood clots. ? Treat heart rate or heart rhythm problems.  Using devices, such as a pacemaker, to correct heart rhythm problems.  Doing surgery to remove the part of the heart that sends bad signals.  Closing an area where clots can form in the heart (left atrial appendage). In some cases, your doctor will treat other underlying conditions. Follow these instructions at home: Medicines  Take over-the-counter and prescription medicines only as told by your doctor.  Do not take any new medicines without first talking to your doctor.  If you are taking blood thinners: ? Talk with your doctor before you take any medicines that have aspirin or NSAIDs, such as ibuprofen, in them. ? Take your medicine exactly as told by your doctor. Take it at the same time each day. ? Avoid activities that could hurt or bruise you. Follow instructions about how to prevent falls. ? Wear a bracelet that says you are taking blood thinners. Or, carry a card that lists what medicines you take. Lifestyle  Do not use any products that have nicotine or tobacco in them. These include cigarettes, e-cigarettes, and chewing tobacco. If you need help quitting, ask your doctor.  Eat heart-healthy foods. Talk with your doctor about the right eating plan for you.  Exercise regularly as told by your doctor.  Do not drink alcohol.  Lose weight if you are overweight.  Do not use drugs, including cannabis.      General instructions  If you have a condition that causes breathing to stop for a short period of time (apnea), treat it as told by your doctor.  Keep  a healthy weight. Do not use diet pills unless your doctor says they are safe for you. Diet pills may make heart problems worse.  Keep all follow-up visits as told by your doctor. This is important. Contact a doctor if:  You notice a change in the speed, rhythm, or strength of your heartbeat.  You are taking a blood-thinning medicine and you get more bruising.  You get tired more easily when you move or exercise.  You have a sudden change in weight. Get help right away if:  You have pain in your chest or your belly (abdomen).  You have trouble breathing.  You have side effects of blood thinners, such as blood in your vomit, poop (stool), or pee (urine), or bleeding that cannot stop.  You have any signs of a stroke. "BE FAST" is an easy way to remember the main warning signs: ? B - Balance. Signs are dizziness, sudden trouble walking, or loss of balance. ? E - Eyes. Signs are trouble seeing or a change in how you see. ? F - Face. Signs are sudden  weakness or loss of feeling in the face, or the face or eyelid drooping on one side. ? A - Arms. Signs are weakness or loss of feeling in an arm. This happens suddenly and usually on one side of the body. ? S - Speech. Signs are sudden trouble speaking, slurred speech, or trouble understanding what people say. ? T - Time. Time to call emergency services. Write down what time symptoms started.  You have other signs of a stroke, such as: ? A sudden, very bad headache with no known cause. ? Feeling like you may vomit (nausea). ? Vomiting. ? A seizure. These symptoms may be an emergency. Do not wait to see if the symptoms will go away. Get medical help right away. Call your local emergency services (911 in the [place] Do not drive yourself to the hospital.   Summary  Atrial fibrillation is a type of heartbeat that is irregular or fast.  You are at higher risk of this condition if you smoke, are older, have diabetes, or are  overweight.  Follow your doctor's instructions about medicines, diet, exercise, and follow-up visits.  Get help right away if you have signs or symptoms of a stroke.  Get help right away if you cannot catch your breath, or you have chest pain or discomfort. This information is not intended to replace advice given to you by your health care provider. Make sure you discuss any questions you have with your health care provider. Document Revised: 12/18/2018 Document Reviewed: 12/18/2018 Elsevier Patient Education  2021 [employer].

## 2020-08-21 LAB — MAGNESIUM: Magnesium: 2.1 mg/dL (ref 1.6–2.3)

## 2020-08-21 LAB — BASIC METABOLIC PANEL
BUN/Creatinine Ratio: 17 (ref 10–24)
BUN: 19 mg/dL (ref 8–27)
CO2: 22 mmol/L (ref 20–29)
Calcium: 9.2 mg/dL (ref 8.6–10.2)
Chloride: 107 mmol/L — ABNORMAL HIGH (ref 96–106)
Creatinine, Ser: 1.11 mg/dL (ref 0.76–1.27)
GFR calc Af Amer: 72 mL/min/{1.73_m2} (ref >59)
GFR calc non Af Amer: 62 mL/min/{1.73_m2} (ref >59)
Glucose: 104 mg/dL — ABNORMAL HIGH (ref 65–99)
Potassium: 4 mmol/L (ref 3.5–5.2)
Sodium: 147 mmol/L — ABNORMAL HIGH (ref 134–144)

## 2020-08-21 LAB — CBC
Hematocrit: 42.4 % (ref 37.5–51.0)
Hemoglobin: 14.5 g/dL (ref 13.0–17.7)
MCH: 34.7 pg — ABNORMAL HIGH (ref 26.6–33.0)
MCHC: 34.2 g/dL (ref 31.5–35.7)
MCV: 101 fL — ABNORMAL HIGH (ref 79–97)
Platelets: 174 10*3/uL (ref 150–450)
RBC: 4.18 x10E6/uL (ref 4.14–5.80)
RDW: 13 % (ref 11.6–15.4)
WBC: 7.2 10*3/uL (ref 3.4–10.8)

## 2020-08-21 LAB — TSH: TSH: 2.08 u[IU]/mL (ref 0.450–4.500)

## 2020-08-24 ENCOUNTER — Ambulatory Visit (INDEPENDENT_AMBULATORY_CARE_PROVIDER_SITE_OTHER): Payer: Medicare Other

## 2020-08-24 ENCOUNTER — Other Ambulatory Visit: Payer: Self-pay

## 2020-08-24 DIAGNOSIS — I4891 Unspecified atrial fibrillation: Secondary | ICD-10-CM | POA: Diagnosis not present

## 2020-08-25 ENCOUNTER — Telehealth: Payer: Self-pay | Admitting: *Deleted

## 2020-08-25 LAB — ECHOCARDIOGRAM COMPLETE
AR max vel: 1.34 cm2
AV Area VTI: 1.47 cm2
AV Area mean vel: 1.25 cm2
AV Mean grad: 6 mmHg
AV Peak grad: 10.9 mmHg
Ao pk vel: 1.65 m/s
Area-P 1/2: 2.14 cm2
S' Lateral: 2.6 cm

## 2020-08-25 MED ORDER — FUROSEMIDE 20 MG PO TABS: 20.0000 mg | ORAL_TABLET | Freq: Every day | ORAL | 3 refills | Status: DC

## 2020-08-25 NOTE — Telephone Encounter (Signed)
Spoke to pt's daughter, [name] (ok per DPR), notified of results of echo and provider's recc.  Patty verbalized understanding. [name] does state that pt told her he has been taking Lasix 20mg  daily already. He had just requested a refill with pharmacy d/t taking daily. I am sending in refill now, but wanted to make aware of this.   Dtr also wants to update that pt states he is feeling better since office visit.  Dtr states he is walking on the treadmill as we speak! And tolerating well.   Pt and daughter have no further questions at this time and will follow up as scheduled 3/1.

## 2020-08-25 NOTE — Telephone Encounter (Signed)
-----   Message from Alver Sorrow, NP sent at 08/25/2020 11:06 AM EST ----- Echo shows normal heart pumping function. Moderate thickening of the heart muscle and mild heart stiffness is likely due to history of high blood pressure - will prevent from progressing by keeping BP well controlled. Mild dilation of ascending aorta, recommend repeat echocardiogram in 1 year for monitoring. Bilateral atria (top chambers of the heart) mildly dilated indicating he likely has not been in atrial fibrillation (abnormal heart rhythm) for very long. Right ventricle mildly enlarged and function moderately reduced - recommend he change Lasix to daily instead of PRN.

## 2020-08-25 NOTE — Telephone Encounter (Signed)
Thank you for the update. Agree with plan to continue Lasix 20mg  daily and refill.   , NP

## 2020-08-27 ENCOUNTER — Encounter: Payer: Self-pay | Admitting: Family

## 2020-09-07 ENCOUNTER — Other Ambulatory Visit: Payer: Self-pay

## 2020-09-07 ENCOUNTER — Encounter: Payer: Self-pay | Admitting: Family

## 2020-09-07 ENCOUNTER — Ambulatory Visit: Payer: Medicare Other | Admitting: Family

## 2020-09-07 ENCOUNTER — Ambulatory Visit (INDEPENDENT_AMBULATORY_CARE_PROVIDER_SITE_OTHER): Payer: Medicare Other

## 2020-09-07 VITALS — BP 128/80 | HR 70 | Ht 72.0 in | Wt 231.0 lb

## 2020-09-07 DIAGNOSIS — I480 Paroxysmal atrial fibrillation: Secondary | ICD-10-CM

## 2020-09-07 DIAGNOSIS — Z7901 Long term (current) use of anticoagulants: Secondary | ICD-10-CM

## 2020-09-07 DIAGNOSIS — E785 Hyperlipidemia, unspecified: Secondary | ICD-10-CM | POA: Diagnosis not present

## 2020-09-07 DIAGNOSIS — I77810 Thoracic aortic ectasia: Secondary | ICD-10-CM

## 2020-09-07 DIAGNOSIS — I25118 Atherosclerotic heart disease of native coronary artery with other forms of angina pectoris: Secondary | ICD-10-CM | POA: Diagnosis not present

## 2020-09-07 NOTE — Patient Instructions (Addendum)
Medication Instructions:  No medication changes today.   *If you need a refill on your cardiac medications before your next appointment, please call your pharmacy*  Lab Work: Your provider recommends lab work today: CBC for monitoring after starting blood thinner  If you have labs (blood work) drawn today and your tests are completely normal, you will receive your results only by: Marland Kitchen MyChart Message (if you have MyChart) OR . A paper copy in the mail If you have any lab test that is abnormal or we need to change your treatment, we will call you to review the results.   Testing/Procedures: Your EKG today shows normal sinus rhythm which is a great result!  Your physician has recommended that you wear a Zio monitor for 2 weeks. This monitor is a medical device that records the heart's electrical activity. Doctors most often use these monitors to diagnose arrhythmias. Arrhythmias are problems with the speed or rhythm of the heartbeat. The monitor is a small device applied to your chest. You can wear one while you do your normal daily activities. While wearing this monitor if you have any symptoms to push the button and record what you felt. Once you have worn this monitor for the period of time provider prescribed (Usually 14 days), you will return the monitor device in the postage paid box. Once it is returned they will download the data collected and provide [place] with a report which the provider will then review and we will call you with those results. Important tips:  1. Avoid showering during the first 24 hours of wearing the monitor. 2. Avoid excessive sweating to help maximize wear time. 3. Do not submerge the device, no hot tubs, and no swimming pools. 4. Keep any lotions or oils away from the patch. 5. After 24 hours you may shower with the patch on. Take brief showers with your back facing the shower head.  6. Do not remove patch once it has been placed because that will interrupt data and  decrease adhesive wear time. 7. Push the button when you have any symptoms and write down what you were feeling. 8. Once you have completed wearing your monitor, remove and place into box which has postage paid and place in your outgoing mailbox.  9. If for some reason you have misplaced your box then call our office and we can provide another box and/or mail it off for you.       Follow-Up: At [facility], you and your health needs are our priority.  As part of our continuing mission to provide you with exceptional heart care, we have created designated Provider Care Teams.  These Care Teams include your primary Cardiologist (physician) and Advanced Practice Providers (APPs -  Physician Assistants and Nurse Practitioners) who all work together to provide you with the care you need, when you need it.  We recommend signing up for the patient portal called "MyChart".  Sign up information is provided on this After Visit Summary.  MyChart is used to connect with patients for Virtual Visits (Telemedicine).  Patients are able to view lab/test results, encounter notes, upcoming appointments, etc.  Non-urgent messages can be sent to your provider as well.   To learn more about what you can do with MyChart, go to [email or web address].    Your next appointment:   2 month(s)  The format for your next appointment:   In Person  Provider:   You may see Julien Nordmann, MD or one of the  following Advanced Practice Providers on your designated Care Team:    Nicolasa Ducking, NP  Eula Listen, PA-C  Marisue Ivan, PA-C  Cadence [place], [place]  Gillian Shields, NP

## 2020-09-07 NOTE — Progress Notes (Signed)
Office Visit    Patient Name: [name] Date of Encounter: 09/07/2020  PCP:  Marguarite Arbour, MD   [facility]  Cardiologist:  Julien Nordmann, MD  Electrophysiologist:  None   Chief Complaint    [name] is a 80 y.o. male with a hx of CAD s/p CABG 2016, hypertension, hyperlipidemia, arthritis, atrial fibrillation presents today for follow up of atrial fibrillation.  Past Medical History    Past Medical History:  Diagnosis Date  . Arthritis   . CAD (coronary artery disease)    a. s/p DES to LAD and RCA (2001/2005);  b. 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  . ED (erectile dysfunction) of non-organic origin   . Essential hypertension   . History of kidney stones   . History of pneumonia   . Hyperlipidemia, mixed   . Kidney stones   . Leg cramps   . Myocardial infarction (HCC) 2000  . Post-operative atrial fibrillation    a. 04/2015 following CABG.   Past Surgical History:  Procedure Laterality Date  . BACK SURGERY     2 lunbar surgeries  . CARDIAC CATHETERIZATION N/A 04/30/2015   Procedure: Left Heart Cath;  Surgeon: Antonieta Iba, MD;  Location: [facility] INVASIVE CV LAB;  Service: Cardiovascular;  Laterality: N/A;  . COLONOSCOPY WITH PROPOFOL N/A 08/22/2017   Procedure: COLONOSCOPY WITH PROPOFOL;  Surgeon: Toledo, Boykin Nearing, MD;  Location: [facility] ENDOSCOPY;  Service: Gastroenterology;  Laterality: N/A;  . CORONARY ANGIOPLASTY     3 stents  . CORONARY ARTERY BYPASS GRAFT N/A 05/10/2015   Procedure: CORONARY ARTERY BYPASS GRAFTING (CABG), ON PUMP, TIMES FOUR, USING LEFT INTERNAL MAMMARY ARTERY, BILATERAL GREATER SAPHENOUS VEINS HARVESTED ENDOSCOPICALLY, WITH CORONARY ENDARTERECTOMY;  Surgeon: Kerin Perna, MD;  Location: [facility] OR;  Service: Open Heart Surgery;  Laterality: N/A;  . RETINAL DETACHMENT SURGERY Left   . TEE WITHOUT CARDIOVERSION N/A 05/10/2015   Procedure: TRANSESOPHAGEAL ECHOCARDIOGRAM (TEE);  Surgeon:  Kerin Perna, MD;  Location: [facility];  Service: Open Heart Surgery;  Laterality: N/A;  . TOOTH EXTRACTION     Allergies  Allergies  Allergen Reactions  . Codeine     REACTION: Vomiting  . Lisinopril     Cough    History of Present Illness    [name] is a 80 y.o. male with a hx of CAD s/p CABG 2016, hypertension, hyperlipidemia, arthritis, atrial fibrillation last seen 08/20/20  Coronary artery disease with prior stenting of LAD and RCA in early 2000's. In 2016 he had recurrent chest pain and dyspnea with abnormal stress test. Catheterization showed severe multivessel CAD and underwent CABGx4. Postoperative course complicated by mild anemia, mild thrombocytopenia, and paroxysmal atrial fibrillation requiring short course of amiodarone. Additional history includes hypertension, hyperlipidemia, nephrolithiasis, arthritis.   He was last seen in clinic 08/26/19 and doing well. His metoprolol tartrate was transitioned to metoprolol succinate for daily dosing.   Seen in follow up 08/20/20 with chief complaint of fatigue. He was noted to be in atrial fibrillation with RVR at 115 bpm.  His aspirin and Plavix were stopped and he was started on Eliquis 5 mg twice daily due to CHA2DS2-VASc of at least 4 (agex2, CAD, hypertension).  His Toprol was increased from 25 mg to 50 mg daily.  Lab work showed K4.0, NA 147, creatinine 1.11, GFR 62, hemoglobin 14.5, TSH 2.08.  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  Presents today for follow-up.  Reports marked improvement in his exercise tolerance and energy level.  Denies palpitations, chest pain, pressure, tightness, shortness of breath.  Tells me about 3 days after  her clinic visit he started to feel better.  His EKG does show that he is in sinus rhythm which he is pleased by.  He is very active working as Actor projects.  Reports tolerating Toprol 50 mg daily and Eliquis without difficulty.  Denies bleeding complications.  We discussed the indication to stay on anticoagulation in the setting of elevated CHA2DS2-VASc 1 he is agreeable as she shares with me that his brother and father both died of stroke.  EKGs/Labs/Other Studies Reviewed:   The following studies were reviewed today:  Echo 08/24/2020  1. Left ventricular ejection fraction, by estimation, is 50 to 55%. The  left ventricle has low normal function. Left ventricular endocardial  border not optimally defined to evaluate regional wall motion. There is  moderate left ventricular hypertrophy.  Left ventricular diastolic parameters are consistent with Grade I  diastolic dysfunction (impaired relaxation).   2. Right ventricular systolic function is moderately reduced. The right  ventricular size is mildly enlarged. Tricuspid regurgitation signal is  inadequate for assessing PA pressure.   3. Left atrial size was mildly dilated.   4. Right atrial size was mildly dilated.   5. The mitral valve is grossly normal. Mild mitral valve regurgitation.   6. The aortic valve has an indeterminant number of cusps. There is  moderate calcification of the aortic valve. There is moderate thickening  of the aortic valve. Aortic valve regurgitation is not visualized. Mild to  moderate aortic valve  sclerosis/calcification is present, without any evidence of aortic  stenosis.   7. Aortic dilatation noted. There is mild dilatation of the ascending  aorta, measuring 41 mm.   8. Mildly dilated pulmonary artery.   EKG:  EKG is ordered today.  The ekg ordered today demonstrates sinus rhythm 69 bpm with first-degree AV block, left axis deviation with no acute ST/T wave  changes.  Recent Labs: 08/20/2020: BUN 19; Creatinine, Ser 1.11; Hemoglobin 14.5; Magnesium 2.1; Platelets 174; Potassium 4.0; Sodium 147; TSH 2.080  Recent Lipid Panel No results found for: CHOL, TRIG, HDL, CHOLHDL, VLDL, LDLCALC, LDLDIRECT  Risk Assessment/Calculations:   CHA2DS2-VASc Score = 4  This indicates a 4.8% annual risk of stroke. The patient's score is based upon: CHF History: No HTN History: Yes Diabetes History: No Stroke History: No Vascular Disease History: Yes Age Score: 2 Gender Score: 0   Home Medications   Current Meds  Medication Sig  . apixaban (ELIQUIS) 5 MG TABS tablet Take 1 tablet (5 mg total) by mouth 2 (two) times daily.  . clobetasol cream (TEMOVATE) 0.05 % SMARTSIG:1 Topical Every Night  . fexofenadine (ALLEGRA) 180 MG tablet Take 180 mg by mouth as needed.   . furosemide (LASIX) 20 MG tablet Take 1 tablet (20 mg total) by mouth daily.  Marland Kitchen losartan (COZAAR) 25 MG tablet TAKE 1 TABLET BY MOUTH EVERY DAY  . metoprolol succinate (TOPROL-XL) 50 MG 24 hr tablet Take 1 tablet (50 mg total) by mouth daily.  . simvastatin (ZOCOR) 40 MG tablet TAKE 1 TABLET BY MOUTH EVERY DAY     Review  of Systems  All other systems reviewed and are otherwise negative except as noted above.  Physical Exam    VS:  BP 128/80 (BP Location: Left Arm, Patient Position: Sitting, Cuff Size: Normal)   Pulse 70   Ht 6' (1.829 m)   Wt 231 lb (104.8 kg)   BMI 31.33 kg/m  , BMI Body mass index is 31.33 kg/m.  Wt Readings from Last 3 Encounters:  09/07/20 231 lb (104.8 kg)  08/20/20 231 lb (104.8 kg)  08/26/19 228 lb 8 oz (103.6 kg)    GEN: Well nourished, well developed, in no acute distress. HEENT: normal. Neck: Supple, no JVD, carotid bruits, or masses. Cardiac: irregularly irregular, tachycardic, no murmurs, rubs, or gallops. No clubbing, cyanosis, edema.  Radials/DP/PT 2+ and equal bilaterally.  Respiratory:  Respirations regular and unlabored, clear to auscultation  bilaterally. GI: Soft, nontender, nondistended. MS: No deformity or atrophy. Skin: Warm and dry, no rash. Neuro:  Strength and sensation are intact. Psych: Normal affect.  Assessment & Plan    1. Atrial fibrillation - Previously with postoperative atrial fib after CABG in 2016 managed with short course of Amiodarone. Recurrent atrial fibrillation 08/2020 self converted with increased dose of Toprol. Atrial fibrillation symptoms are fatigue and exercise intolerance. Echo 08/24/2020 LVEF 55-55%, GR1DD, RVSF moderately reduced, bialteral atria mildly dilated, ascending aorta 19mm.  EKG today shows he has converted to NSR.  Continue Toprol 50mg  QD, Eliquis 5mg  BID. Denies bleeding complications. CBC today for monitoring. Plan for 14 day ZIO-XT to assess for any recurrent atrial fibrillation and determine burden. He has never had sleep study which may need to be considered at follow up and pending results of ZIO-XT.  2. HTN - BP well controlled. Continue current antihypertensive regimen.   3. CAD s/p CABG x4 - Reports no anginal symptoms. No indication for ischemic evaluation. GDMT includes beta blocker, statin. No Aspirin and Plavix due to chronic anticoagulation.  Recommend regular cardiovascular exercise and heart healthy diet.  4. HLD, LDL <70 - Continue Simvastatin 40mg  daily.   5. 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  Plan for repeat echo in 1 year.   Disposition: Follow up in 2 month(s) with Dr. or APP   Signed, , NP 09/07/2020, 9:29 AM [facility]

## 2020-09-08 ENCOUNTER — Telehealth: Payer: Self-pay | Admitting: *Deleted

## 2020-09-08 DIAGNOSIS — I480 Paroxysmal atrial fibrillation: Secondary | ICD-10-CM

## 2020-09-08 LAB — CBC
Hematocrit: 38.5 % (ref 37.5–51.0)
Hemoglobin: 13.5 g/dL (ref 13.0–17.7)
MCH: 34.4 pg — ABNORMAL HIGH (ref 26.6–33.0)
MCHC: 35.1 g/dL (ref 31.5–35.7)
MCV: 98 fL — ABNORMAL HIGH (ref 79–97)
Platelets: 104 10*3/uL — ABNORMAL LOW (ref 150–450)
RBC: 3.92 x10E6/uL — ABNORMAL LOW (ref 4.14–5.80)
RDW: 12.9 % (ref 11.6–15.4)
WBC: 5.7 10*3/uL (ref 3.4–10.8)

## 2020-09-08 NOTE — Telephone Encounter (Signed)
-----   Message from Alver Sorrow, NP sent at 09/08/2020  7:43 AM EST ----- CBC shows stable hemoglobin and hematocrit since starting anticoagulation. His platelet count has dropped though it has been noted to be low in the past. Recommend [facility] in 2-3 weeks for monitoring.

## 2020-09-08 NOTE — Addendum Note (Signed)
Addended by: Margrett Rud on: 09/08/2020 09:54 AM   Modules accepted: Orders

## 2020-09-08 NOTE — Telephone Encounter (Signed)
Spoke with patients daughter per release form and reviewed results and recommendations. Requested that she have him go to [facility] around 09/29/20 to have repeat CBC done at that time. Reviewed that no appointment is needed for this and he will just need to check in at registration. She verbalized understanding of request, agreeable with plan, and had no further questions at this time.

## 2020-10-05 ENCOUNTER — Other Ambulatory Visit
Admission: RE | Admit: 2020-10-05 | Discharge: 2020-10-05 | Disposition: A | Discharge status: Home / Self Care | Payer: Medicare Other | Attending: Family | Admitting: Family

## 2020-10-05 ENCOUNTER — Other Ambulatory Visit: Payer: Self-pay

## 2020-10-05 DIAGNOSIS — I480 Paroxysmal atrial fibrillation: Secondary | ICD-10-CM | POA: Diagnosis present

## 2020-10-05 LAB — CBC
HCT: 40.5 % (ref 39.0–52.0)
Hemoglobin: 13.7 g/dL (ref 13.0–17.0)
MCH: 34.4 pg — ABNORMAL HIGH (ref 26.0–34.0)
MCHC: 33.8 g/dL (ref 30.0–36.0)
MCV: 101.8 fL — ABNORMAL HIGH (ref 80.0–100.0)
Platelets: 168 10*3/uL (ref 150–400)
RBC: 3.98 MIL/uL — ABNORMAL LOW (ref 4.22–5.81)
RDW: 13.1 % (ref 11.5–15.5)
WBC: 7 10*3/uL (ref 4.0–10.5)
nRBC: 0 % (ref 0.0–0.2)

## 2020-11-05 NOTE — Progress Notes (Signed)
Cardiology Office Note  Date:  11/08/2020   ID:  [name], DOB 02/03/1941, MRN [id number]  PCP:  Marguarite Arbour, MD   Chief Complaint  Patient presents with  . 2 month follow up     "doing well." Medications reviewed by the patient verbally.     HPI:  [name] is a very pleasant 80 year old male with a history of coronary artery disease,  stenting of the LAD and right coronary artery with drug-eluting stents. Catheterization in April 2005, which showed moderate diffuse nonobstructive disease.   bypass surgery x 4 at [facility] 05/2015 For severe 3 vessel dz postoperative atrial fibrillation hypertension,  hyperlipidemia,  kidney stones Never smoked Hx of bronchitis He presents today for follow-up of his coronary artery disease, paroxysmal atrial fibrillation  Seen by one of our providers September 07, 2020 08/20/20 with  fatigue. In office  in atrial fibrillation with RVR at 115 bpm.    started on Eliquis 5 mg twice daily due to CHA2DS2-VASc of at least 4 (agex2, CAD, hypertension).    Toprol  to 50 mg daily.    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  Regular walking, 1 1/4 daily  arthritis problems, legs, back pain  works, does Equities trader  Used to be on lasix, stopped as no edema No edema   fell off treadmill last year 2018 Thought he may have broken a rib  Having some knee pain  denies any shortness of breath on exertion Uses his treadmill Used to do 20 miles a week, now not doing as much   previously stopped lisinopril for cough He also held his losartan. Thought it caused a cough (bronchitis)  Lab work reviewed with him Total chol 123, LDL 68  EKG personally  reviewed by myself on todays visit shows normal sinus rhythm with rate 59 bpm, no significant ST or T-wave changes, left anterior fascicular block  Other past medical history He is been seen by pulmonary, had CT scan 02/08/2015, showing mild interstitial change  positive stress test (anterior wall ) at the end of 2016 (for chest pain and shortness of breath) leading to cardiac catheterization showing three-vessel disease, severe ostial LAD disease Sent to [facility] for a CABG He had postoperative atrial fibrillation  Myoview in April 2008 which showed a 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 EF 54%.  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  he is working full-time job.   PMH:   has a past medical history of Arthritis, CAD (coronary artery disease), ED (erectile dysfunction) of non-organic origin, Essential hypertension, History of kidney stones, History of pneumonia, Hyperlipidemia, mixed, Kidney stones, Leg cramps, Myocardial infarction (HCC) (2000), and Post-operative atrial fibrillation.  PSH:    Past Surgical History:  Procedure Laterality Date  . BACK SURGERY     2 lunbar surgeries  . CARDIAC CATHETERIZATION N/A 04/30/2015   Procedure: Left Heart Cath;  Surgeon: Antonieta Iba, MD;  Location: [facility] INVASIVE CV LAB;  Service: Cardiovascular;  Laterality: N/A;  . COLONOSCOPY WITH PROPOFOL N/A 08/22/2017   Procedure: COLONOSCOPY WITH PROPOFOL;  Surgeon: Strasburg,  Boykin Nearing, MD;  Location: [facility] ENDOSCOPY;  Service: Gastroenterology;  Laterality: N/A;  . CORONARY ANGIOPLASTY     3 stents  . CORONARY ARTERY BYPASS GRAFT N/A 05/10/2015   Procedure: CORONARY ARTERY BYPASS GRAFTING (CABG), ON PUMP, TIMES FOUR, USING LEFT INTERNAL MAMMARY ARTERY, BILATERAL GREATER SAPHENOUS VEINS HARVESTED ENDOSCOPICALLY, WITH CORONARY  ENDARTERECTOMY;  Surgeon: Kerin Perna, MD;  Location: [facility] OR;  Service: Open Heart Surgery;  Laterality: N/A;  . RETINAL DETACHMENT SURGERY Left   . TEE WITHOUT CARDIOVERSION N/A 05/10/2015   Procedure: TRANSESOPHAGEAL ECHOCARDIOGRAM (TEE);  Surgeon: Kerin Perna, MD;  Location: [facility] OR;  Service: Open Heart Surgery;  Laterality: N/A;  . TOOTH EXTRACTION      Current Outpatient Medications  Medication Sig Dispense Refill  . apixaban (ELIQUIS) 5 MG TABS tablet Take 1 tablet (5 mg total) by mouth 2 (two) times daily. 60 tablet 5  . clobetasol cream (TEMOVATE) 0.05 % SMARTSIG:1 Topical Every Night    . fexofenadine (ALLEGRA) 180 MG tablet Take 180 mg by mouth as needed.     . furosemide (LASIX) 20 MG tablet Take 1 tablet (20 mg total) by mouth daily. 90 tablet 3  . losartan (COZAAR) 25 MG tablet TAKE 1 TABLET BY MOUTH EVERY DAY 90 tablet 0  . metoprolol succinate (TOPROL-XL) 50 MG 24 hr tablet Take 1 tablet (50 mg total) by mouth daily. 30 tablet 5  . simvastatin (ZOCOR) 40 MG tablet TAKE 1 TABLET BY MOUTH EVERY DAY 90 tablet 0   No current facility-administered medications for this visit.     Allergies:   Codeine and Lisinopril   Social History:  The patient  reports that he has never smoked. He has never used smokeless tobacco. He reports that he does not drink alcohol and does not use drugs.   Family History:   Family history is unknown by patient.    Review of Systems: Review of Systems  Constitutional: Negative.   HENT: Negative.   Respiratory: Negative.   Cardiovascular: Negative.   Gastrointestinal: Negative.   Musculoskeletal: Positive for back pain and joint pain.  Neurological: Negative.   Psychiatric/Behavioral: Negative.   All other systems reviewed and are negative.    PHYSICAL EXAM: VS:  BP 120/82 (BP Location: Left Arm, Patient Position: Sitting, Cuff Size: Normal)   Pulse (!) 59   Ht 6' (1.829 m)   Wt 227 lb 2 oz (103 kg)   SpO2 98%   BMI 30.80 kg/m   , BMI Body mass index is 30.8 kg/m. Constitutional:  oriented to person, place, and time. No distress.  HENT:  Head: Grossly normal Eyes:  no discharge. No scleral icterus.  Neck: No JVD, no carotid bruits  Cardiovascular: Regular rate and rhythm, no murmurs appreciated Pulmonary/Chest: Clear to auscultation bilaterally, no wheezes or rails Abdominal: Soft.  no distension.  no tenderness.  Musculoskeletal: Normal range of motion Neurological:  normal muscle tone. Coordination normal. No atrophy Skin: Skin warm and dry Psychiatric: normal affect, pleasant  Recent Labs: 08/20/2020: BUN 19; Creatinine, Ser 1.11; Magnesium 2.1; Potassium 4.0; Sodium 147; TSH 2.080 10/05/2020: Hemoglobin 13.7; Platelets 168    Lipid Panel No results found for: CHOL, HDL, LDLCALC, TRIG    Wt Readings from Last 3 Encounters:  11/08/20 227 lb 2 oz (103 kg)  09/07/20 231 lb (104.8 kg)  08/20/20 231 lb (104.8 kg)      ASSESSMENT AND PLAN:  Essential hypertension -  Blood pressure is well controlled on today's visit.  No changes made to the medications.  Atherosclerosis of native coronary artery of native heart with angina pectoris (HCC) - Currently with no symptoms of angina. No further workup at this time. Continue current medication regimen.  Hyperlipidemia LDL goal <70 - Plan: EKG 12-Lead Cholesterol is at goal on the current lipid regimen. No changes to the medications were made.  S/P CABG x 4 - Plan: EKG 12-Lead cholesterol in the past at goal On eliquis, off asa and plavix No angina  Paroxysmal atrial fibrillation Continue Eliquis, metoprolol, maintaining normal sinus rhythm Previously asymptomatic No recent falls, no bleeding    Total encounter time more than 25 minutes  Greater than 50% was spent in counseling and coordination of care with the patient    No orders of the defined types were placed in this encounter.    Signed, [name], M.D., Ph.D. 11/08/2020  [facility] [place], [place] [phone]

## 2020-11-08 ENCOUNTER — Encounter: Payer: Self-pay | Admitting: Cardiovascular Disease

## 2020-11-08 ENCOUNTER — Ambulatory Visit: Payer: Medicare Other | Admitting: Cardiovascular Disease

## 2020-11-08 ENCOUNTER — Other Ambulatory Visit: Payer: Self-pay

## 2020-11-08 VITALS — BP 120/82 | HR 59 | Ht 72.0 in | Wt 227.1 lb

## 2020-11-08 DIAGNOSIS — I77810 Thoracic aortic ectasia: Secondary | ICD-10-CM | POA: Diagnosis not present

## 2020-11-08 DIAGNOSIS — I480 Paroxysmal atrial fibrillation: Secondary | ICD-10-CM

## 2020-11-08 DIAGNOSIS — I25118 Atherosclerotic heart disease of native coronary artery with other forms of angina pectoris: Secondary | ICD-10-CM | POA: Diagnosis not present

## 2020-11-08 DIAGNOSIS — E785 Hyperlipidemia, unspecified: Secondary | ICD-10-CM | POA: Diagnosis not present

## 2020-11-08 DIAGNOSIS — I10 Essential (primary) hypertension: Secondary | ICD-10-CM

## 2020-11-08 NOTE — Patient Instructions (Addendum)
Medication Instructions:  No changes  If you need a refill on your cardiac medications before your next appointment, please call your pharmacy.    Lab work: No new labs needed   If you have labs (blood work) drawn today and your tests are completely normal, you will receive your results only by: . MyChart Message (if you have MyChart) OR . A paper copy in the mail If you have any lab test that is abnormal or we need to change your treatment, we will call you to review the results.   Testing/Procedures: No new testing needed   Follow-Up: At [facility], you and your health needs are our priority.  As part of our continuing mission to provide you with exceptional heart care, we have created designated Provider Care Teams.  These Care Teams include your primary Cardiologist (physician) and Advanced Practice Providers (APPs -  Physician Assistants and Nurse Practitioners) who all work together to provide you with the care you need, when you need it.  . You will need a follow up appointment in 6 months, APP ok  . Providers on your designated Care Team:   . Christopher Berge, NP . Ryan Dunn, PA-C . Jacquelyn Visser, PA-C  Any Other Special Instructions Will Be Listed Below (If Applicable).  COVID-19 Vaccine Information can be found at: [email or web address] For questions related to vaccine distribution or appointments, please email [email or web address] or call [phone].     

## 2020-11-13 ENCOUNTER — Other Ambulatory Visit: Payer: Self-pay | Admitting: Cardiovascular Disease

## 2020-11-15 ENCOUNTER — Other Ambulatory Visit: Payer: Self-pay

## 2020-11-15 MED ORDER — SIMVASTATIN 40 MG PO TABS: 40.0000 mg | ORAL_TABLET | Freq: Every day | ORAL | 3 refills | Status: DC

## 2020-11-15 MED ORDER — LOSARTAN POTASSIUM 25 MG PO TABS: 25.0000 mg | ORAL_TABLET | Freq: Every day | ORAL | 3 refills | Status: DC

## 2020-11-15 NOTE — Telephone Encounter (Signed)
Reached out to [name] regarding his losartan and insurance plan. Noted that Losartan was NOT on his formulary plan, pt has not changed insurance nor is this a new medication, pt has been on Losartan 25 mg for the past several years.   Noted his secondary insurance is [employer]...pt reports only has one insurance coverage. Pt gave permission to speak with daughter who handles his insurance and picks up his medications. [name] reports her dad has had the same insurance for several years, no change in plan or policy. Per chart, it is showing "generic inmate" as his primary coverage, [name] reports that is "not correct", [name] "has never had such insurance". Advised will try to get this removed and place BCBS as his primary.   Sent in his losartan and simvastatin refills, advised if any trouble with medication refills and cost of losartan to please call the clinic and will try to resolve the issues. [name] reports there has never been an issue with Losartan coverage or the cost. Scripts sent in to [facility], [name] will call back with any further concern.

## 2020-11-15 NOTE — Telephone Encounter (Signed)
Why Am I Seeing These Alternatives?   losartan (COZAAR) 25 MG tablet [Pharmacy Med Name: LOSARTAN POTASSIUM 25 MG TAB] is not on the preferred formulary for the patient's insurance plan. Below are alternatives which are likely to be more affordable. Do not assume that every medication presented is a clinically appropriate alternative.  These alternatives are medications that are in the same pharmaceutical subclass (Angiotensin II Receptor Antagonists) as the ordered medication and are on formulary for the patient's insurance plan.  On patient's preferred formulary list are Telmisartan, Irbesartan, Candesartan, Valsartan, and Olmesartan.  Please advise. Thank you!

## 2020-11-15 NOTE — Telephone Encounter (Signed)
Rx request sent to pharmacy.  

## 2021-02-25 ENCOUNTER — Other Ambulatory Visit: Payer: Self-pay | Admitting: Family

## 2021-02-25 DIAGNOSIS — I4891 Unspecified atrial fibrillation: Secondary | ICD-10-CM

## 2021-02-28 ENCOUNTER — Other Ambulatory Visit: Payer: Self-pay | Admitting: *Deleted

## 2021-02-28 DIAGNOSIS — I4891 Unspecified atrial fibrillation: Secondary | ICD-10-CM

## 2021-02-28 MED ORDER — METOPROLOL SUCCINATE ER 50 MG PO TB24: 50.0000 mg | ORAL_TABLET | Freq: Every day | ORAL | 3 refills | Status: DC

## 2021-03-12 ENCOUNTER — Other Ambulatory Visit: Payer: Self-pay | Admitting: Family

## 2021-03-12 DIAGNOSIS — I4891 Unspecified atrial fibrillation: Secondary | ICD-10-CM

## 2021-03-15 NOTE — Telephone Encounter (Signed)
Prescription refill request for Eliquis received. Indication:afib Last office visit:gollan 11/08/20 Scr:1.11 08/20/20 Age: 31m Weight:103kg

## 2021-04-15 DIAGNOSIS — Z23 Encounter for immunization: Secondary | ICD-10-CM

## 2021-07-18 ENCOUNTER — Other Ambulatory Visit: Payer: Self-pay | Admitting: Neurosurgery

## 2021-07-18 DIAGNOSIS — M5416 Radiculopathy, lumbar region: Secondary | ICD-10-CM

## 2021-07-18 DIAGNOSIS — M5136 Other intervertebral disc degeneration, lumbar region: Secondary | ICD-10-CM

## 2021-07-27 ENCOUNTER — Other Ambulatory Visit: Payer: Self-pay

## 2021-07-27 ENCOUNTER — Ambulatory Visit
Admission: RE | Admit: 2021-07-27 | Discharge: 2021-07-27 | Disposition: A | Discharge status: Home / Self Care | Payer: Medicare Other | Source: Ambulatory Visit | Attending: Neurosurgery | Admitting: Neurosurgery

## 2021-07-27 DIAGNOSIS — M5416 Radiculopathy, lumbar region: Secondary | ICD-10-CM | POA: Diagnosis present

## 2021-07-27 DIAGNOSIS — M5136 Other intervertebral disc degeneration, lumbar region: Secondary | ICD-10-CM | POA: Diagnosis present

## 2021-09-04 ENCOUNTER — Other Ambulatory Visit: Payer: Self-pay | Admitting: Family

## 2021-09-05 NOTE — Telephone Encounter (Signed)
Rx(s) sent to pharmacy electronically.  

## 2021-10-01 ENCOUNTER — Other Ambulatory Visit: Payer: Self-pay | Admitting: Cardiovascular Disease

## 2021-10-01 DIAGNOSIS — I4891 Unspecified atrial fibrillation: Secondary | ICD-10-CM

## 2021-10-03 NOTE — Telephone Encounter (Signed)
Eliquis 5 mg refill request received. Patient is 81 years old, weight- 103 kg, Crea- 1.1 on 09/20/21, Diagnosis- PAF, and last seen by Dr. Rockey Situ on 11/08/20. Dose is appropriate based on dosing criteria. Will send in refill to requested pharmacy.   ?

## 2021-10-03 NOTE — Telephone Encounter (Signed)
refill 

## 2021-10-24 ENCOUNTER — Telehealth: Payer: Self-pay | Admitting: Cardiovascular Disease

## 2021-10-24 NOTE — Telephone Encounter (Signed)
Patient c/o Palpitations:  High priority if patient c/o lightheadedness, shortness of breath, or chest pain ? ?How long have you had palpitations/irregular HR/ Afib? Are you having the symptoms now?  ?Patient's daughter states last week the patient told her he felt like he was going in and out of afib for the past 2 weeks ?Are you currently experiencing lightheadedness, SOB or CP?  ?Patient's daughter states the patient has been SOB as well, but she is not currently with him ? ?Do you have a history of afib (atrial fibrillation) or irregular heart rhythm?  ?Yes  ? ?Have you checked your BP or HR? (document readings if available):  ?159/64  ?180/?? ? ?Are you experiencing any other symptoms?  ?No  ? ?

## 2021-10-24 NOTE — Telephone Encounter (Signed)
Spoke with the patients daughter [name]. ?Patty sts that the patient has not been felling well for 2 weeks. ?Patient reports increased episodes of AFIB and sob. ?Patient is past due for his f/u. ? ?Adv the Patty that we should schedule an appt for the patient to be evaluated. ?Appt scheduled on 10/25/21 @ 3:20 pm with Dr. Mariah Milling. ?Patty aware of the appt date and time and voiced appreciation for the assistance. ? ?

## 2021-10-25 ENCOUNTER — Encounter: Payer: Self-pay | Admitting: Cardiovascular Disease

## 2021-10-25 ENCOUNTER — Ambulatory Visit: Payer: Medicare Other | Admitting: Cardiovascular Disease

## 2021-10-25 VITALS — BP 130/68 | HR 74 | Ht 72.0 in | Wt 224.0 lb

## 2021-10-25 DIAGNOSIS — I480 Paroxysmal atrial fibrillation: Secondary | ICD-10-CM

## 2021-10-25 MED ORDER — AMIODARONE HCL 200 MG PO TABS: ORAL_TABLET | ORAL | 0 refills | Status: DC

## 2021-10-25 MED ORDER — AMIODARONE HCL 200 MG PO TABS
ORAL_TABLET | ORAL | 0 refills | Status: DC
Start: 2021-10-25 — End: 2021-10-25

## 2021-10-25 NOTE — Progress Notes (Signed)
Cardiology Office Note ? ?Date:  10/25/2021  ? ?ID:  [name], DOB [birth year], MRN [id number] ? ?PCP:  Idelle Crouch, MD  ? ?Chief Complaint  ?Patient presents with  ? Atrial Fibrillation  ?  Patient c/o shortness of breath & A-fib for two weeks. Medications reviewed by the patient verbally.   ? ? ?HPI:  ?Mr. Pacific is a very pleasant 81 year old male with a history of ?coronary artery disease,  ?stenting of the LAD and right coronary artery with drug-eluting stents. Catheterization in April 2005, which showed moderate diffuse nonobstructive disease.   ?bypass surgery x 4 at [facility] 05/2015 For severe 3 vessel dz ?postoperative atrial fibrillation ?hypertension,  ?hyperlipidemia,  ?kidney stones ?Never smoked ?Hx of bronchitis ?He presents today for follow-up of his coronary artery disease, paroxysmal atrial fibrillation ? ?Last seen in clinic by myself May 2022 ?On review of the chart,  we have received phone call with reports of increasing atrial fibrillation shortness of breath ? ?Seen by primary care 1 month ago ? ?"Bad back pain" 05/2021, stopped working out ?Back better dec 2022, started working out again ?Felt tired, BP elevated ?Only doing 1/2 what he used to , less energy ?He is concerned that he is going in and out of atrial fibrillation ?Over the weekend BP 160 /70 ? ?Lab work reviewed ?Total cholesterol 117 LDL 67 ?Normal BMP ?Hemoglobin 13.8 ?A1c 5.6 ? ?Increased SOB walking into the office ?Weight higher recently 220 pounds, above his baseline 217 ? ?EKG personally reviewed by myself on todays visit ?Normal sinus rhythm with frequent PVCs ? ?Past medical history reviewed ?08/20/20  in atrial fibrillation with RVR at 115 bpm.   ? started on Eliquis 5 mg twice daily due to CHA2DS2-VASc of at least 4 (agex2, CAD, hypertension).   ? Toprol  to 50 mg daily.  ? ? Echocardiogram 08/24/20 showing LVEf 50-55%, moderate LVH, grade 1 diastolic dysfunction, RV SF moderately reduced, RV mildly enlarged,  bilateral atrium mildly dilated, mild MR, mild to moderate aortic valve sclerosis without stenosis, mild dilation of ascending aorta 41 mm, mildly dilated pulmonary artery. ?  ?He is been seen by pulmonary, had CT scan 02/08/2015, showing mild interstitial change ? ?positive stress test (anterior wall ) at the end of 2016 (for chest pain and shortness of breath) leading to cardiac catheterization showing three-vessel disease, severe ostial LAD disease ?Sent to [facility] for a CABG ?He had postoperative atrial fibrillation ?  ?Myoview in April 2008 which showed a 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 EF 54%.   ?severe diarrhea October 2015 and was put in the hospital for dehydration ?He developed the flu in February 2016 ?  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  ?he is working full-time job. ?  ? ?PMH:   has a past medical history of Arthritis, CAD (coronary artery disease), ED (erectile dysfunction) of non-organic origin, Essential hypertension, History of kidney stones, History of pneumonia, Hyperlipidemia, mixed, Kidney stones, Leg cramps, Myocardial infarction ([place]) (2000), and Post-operative atrial fibrillation. ? ?PSH:    ?Past Surgical History:  ?Procedure Laterality Date  ? BACK SURGERY    ? 2 lunbar surgeries  ? CARDIAC CATHETERIZATION N/A 04/30/2015  ? Procedure: Left Heart Cath;  Surgeon: Minna Merritts, MD;  Location: [place] [facility] LAB;  Service: Cardiovascular;  Laterality: N/A;  ? COLONOSCOPY WITH PROPOFOL N/A 08/22/2017  ? Procedure: COLONOSCOPY WITH PROPOFOL;  Surgeon: Alice Reichert, Benay Pike, MD;  Location: [facility] ENDOSCOPY;  Service: Gastroenterology;  Laterality: N/A;  ? CORONARY ANGIOPLASTY    ? 3 stents  ? CORONARY ARTERY BYPASS GRAFT N/A 05/10/2015  ? Procedure: CORONARY ARTERY BYPASS GRAFTING (CABG), ON PUMP, TIMES FOUR, USING LEFT INTERNAL MAMMARY ARTERY, BILATERAL GREATER SAPHENOUS VEINS HARVESTED ENDOSCOPICALLY, WITH CORONARY  ENDARTERECTOMY;  Surgeon: Kerin Perna, MD;  Location: [facility] OR;  Service: Open Heart Surgery;  Laterality: N/A;  ? RETINAL DETACHMENT SURGERY Left   ? TEE WITHOUT CARDIOVERSION N/A 05/10/2015  ? Procedure: TRANSESOPHAGEAL ECHOCARDIOGRAM (TEE);  Surgeon: Kerin Perna, MD;  Location: [facility] OR;  Service: Open Heart Surgery;  Laterality: N/A;  ? TOOTH EXTRACTION    ? ? ?Current Outpatient Medications  ?Medication Sig Dispense Refill  ? amiodarone (PACERONE) 200 MG tablet Take 200 mg twice daily for 7 days, then 200 mg once daily thereafter 90 tablet 0  ? apixaban (ELIQUIS) 5 MG TABS tablet Take 1 tablet (5 mg total) by mouth 2 (two) times daily. Need appt w/ Dr. Mariah Milling for more refills 60 tablet 6  ? clobetasol cream (TEMOVATE) 0.05 % SMARTSIG:1 Topical Every Night    ? diazepam (VALIUM) 5 MG tablet TAKE 1 TABLET (5 MG TOTAL) BY MOUTH EVERY 8 (EIGHT) HOURS AS NEEDED FOR ANXIETY FOR UP TO 10 DAYS    ? fexofenadine (ALLEGRA) 180 MG tablet Take 180 mg by mouth as needed.     ? furosemide (LASIX) 20 MG tablet TAKE 1 TABLET BY MOUTH EVERY DAY 90 tablet 0  ? ibuprofen (ADVIL) 600 MG tablet Take 600 mg by mouth every 4 (four) hours as needed.    ? losartan (COZAAR) 25 MG tablet Take 1 tablet (25 mg total) by mouth daily. 90 tablet 3  ? metoprolol succinate (TOPROL-XL) 50 MG 24 hr tablet Take 1 tablet (50 mg total) by mouth daily. 30 tablet 3  ? simvastatin (ZOCOR) 40 MG tablet Take 1 tablet (40 mg total) by mouth daily. 90 tablet 3  ? metoprolol succinate (TOPROL-XL) 50 MG 24 hr tablet TAKE 1 TABLET BY MOUTH EVERY DAY (Patient not taking: Reported on 10/25/2021) 90 tablet 0  ? ?No current facility-administered medications for this visit.  ? ? ? ?Allergies:   Codeine and Lisinopril  ? ?Social History:  The patient  reports that he has never smoked. He has never used smokeless tobacco. He reports that he does not drink alcohol and does not use drugs.  ? ?Family History:   Family history is unknown by patient.  ? ? ?Review of  Systems: ?Review of Systems  ?Constitutional:  Positive for malaise/fatigue.  ?HENT: Negative.    ?Respiratory: Negative.    ?Cardiovascular: Negative.   ?Gastrointestinal: Negative.   ?Musculoskeletal:  Positive for back pain and joint pain.  ?Neurological: Negative.   ?Psychiatric/Behavioral: Negative.    ?All other systems reviewed and are negative. ? ? ?PHYSICAL EXAM: ?VS:  BP 130/68 (BP Location: Left Arm, Patient Position: Sitting, Cuff Size: Normal)   Pulse 74   Ht 6' (1.829 m)   Wt 224 lb (101.6 kg)   SpO2 98%   BMI 30.38 kg/m?  , BMI Body mass index is 30.38 kg/m?Marland Kitchen ?Constitutional:  oriented to person, place, and time. No distress.  ?HENT:  ?Head: Grossly normal ?Eyes:  no discharge. No scleral icterus.  ?Neck: No JVD, no carotid bruits  ?Cardiovascular: Regular rate and rhythm, no murmurs appreciated frequent ectopy ?Pulmonary/Chest: Clear to auscultation bilaterally, no wheezes or rails ?Abdominal: Soft.  no distension.  no  tenderness.  ?Musculoskeletal: Normal range of motion ?Neurological:  normal muscle tone. Coordination normal. No atrophy ?Skin: Skin warm and dry ?Psychiatric: normal affect, pleasant ? ? ?Recent Labs: ?No results found for requested labs within last 8760 hours.  ? ? ?Lipid Panel ?No results found for: CHOL, HDL, LDLCALC, TRIG ?  ? ?Wt Readings from Last 3 Encounters:  ?10/25/21 224 lb (101.6 kg)  ?11/08/20 227 lb 2 oz (103 kg)  ?09/07/20 231 lb (104.8 kg)  ?  ? ? ?ASSESSMENT AND PLAN: ? ?Essential hypertension -  ?Blood pressure is well controlled on today's visit. No changes made to the medications. ? ?Atherosclerosis of native coronary artery of native heart with angina pectoris ([place]) - Blood pressure is well controlled on today's visit. No changes made to the medications. ? ?Hyperlipidemia LDL goal <70 - Plan: EKG 12-Lead ?Cholesterol is at goal on the current lipid regimen. No changes to the medications were made. ? ?S/P CABG x 4 - Plan: EKG 12-Lead ?cholesterol in the past  at goal ?On eliquis, off asa and plavix ?No angina ?He does have some shortness of breath, we did discuss echo or stress testing if symptoms persist ? ?Shortness of breath ?Reports weight was higher, ?Recommend he take ext

## 2021-10-25 NOTE — Patient Instructions (Addendum)
Medication Instructions:  ?Amiodarone 200 mg twice a day for 2 weeks, ?Then down to once a day ? ?Lasix daily ?Extra lasix after lunch as needed for increased SOB, for weight >220 ? ?If you need a refill on your cardiac medications before your next appointment, please call your pharmacy.  ? ?Lab work: ?No new labs needed ? ?Testing/Procedures: ?No new testing needed ? ?Follow-Up: ?At [facility], you and your health needs are our priority.  As part of our continuing mission to provide you with exceptional heart care, we have created designated Provider Care Teams.  These Care Teams include your primary Cardiologist (physician) and Advanced Practice Providers (APPs -  Physician Assistants and Nurse Practitioners) who all work together to provide you with the care you need, when you need it. ? ?You will need a follow up appointment in 6 months ? ?Providers on your designated Care Team:   ?Murray Hodgkins, NP ?Christell Faith, PA-C ?Cadence Kathlen Mody, PA-C ? ?COVID-19 Vaccine Information can be found at: [email or web address] For questions related to vaccine distribution or appointments, please email [email or web address] or call [phone].  ? ?

## 2022-01-03 ENCOUNTER — Encounter: Payer: Self-pay | Admitting: Cardiovascular Disease

## 2022-01-03 ENCOUNTER — Other Ambulatory Visit: Payer: Self-pay | Admitting: *Deleted

## 2022-01-03 DIAGNOSIS — Z79899 Other long term (current) drug therapy: Secondary | ICD-10-CM

## 2022-01-03 MED ORDER — AMIODARONE HCL 200 MG PO TABS: ORAL_TABLET | ORAL | 1 refills | Status: DC

## 2022-03-23 DIAGNOSIS — I10 Essential (primary) hypertension: Secondary | ICD-10-CM | POA: Diagnosis not present

## 2022-03-23 DIAGNOSIS — I2510 Atherosclerotic heart disease of native coronary artery without angina pectoris: Secondary | ICD-10-CM | POA: Diagnosis not present

## 2022-03-23 DIAGNOSIS — E7800 Pure hypercholesterolemia, unspecified: Secondary | ICD-10-CM | POA: Diagnosis not present

## 2022-03-23 DIAGNOSIS — Z79899 Other long term (current) drug therapy: Secondary | ICD-10-CM | POA: Diagnosis not present

## 2022-04-28 ENCOUNTER — Ambulatory Visit (LOCAL_COMMUNITY_HEALTH_CENTER): Payer: Medicare Other

## 2022-04-28 DIAGNOSIS — Z23 Encounter for immunization: Secondary | ICD-10-CM | POA: Diagnosis not present

## 2022-04-28 DIAGNOSIS — Z719 Counseling, unspecified: Secondary | ICD-10-CM

## 2022-04-28 NOTE — Progress Notes (Signed)
  Are you feeling sick today? No   Have you ever received a dose of COVID-19 Vaccine? [employer], [place], [place], [place], Other) Yes  If yes, which vaccine and how many doses?   PFIZER, 5   Did you bring the vaccination record card or other documentation?  Yes   Do you have a health condition or are undergoing treatment that makes you moderately or severely immunocompromised? This would include, but not be limited to: cancer, HIV, organ transplant, immunosuppressive therapy/high-dose corticosteroids, or moderate/severe primary immunodeficiency.  No  Have you received COVID-19 vaccine before or during hematopoietic cell transplant (HCT) or CAR-T-cell therapies? No  Have you ever had an allergic reaction to: (This would include a severe allergic reaction or a reaction that caused hives, swelling, or respiratory distress, including wheezing.) A component of a COVID-19 vaccine or a previous dose of COVID-19 vaccine? No   Have you ever had an allergic reaction to another vaccine (other thanCOVID-19 vaccine) or an injectable medication? (This would include a severe allergic reaction or a reaction that caused hives, swelling, or respiratory distress, including wheezing.)   No    Do you have a history of any of the following:  Myocarditis or Pericarditis No  Dermal fillers:  No  Multisystem Inflammatory Syndrome (MIS-C or MIS-A)? No  COVID-19 disease within the past 3 months? No  Vaccinated with monkeypox vaccine in the last 4 weeks? No  Eligible and administered Fluzone high dose and Covid 19 vaccine Comirnaty 12Y+, 2023-2024, monitored, tolerated well. Given VIS and NIR, discussed and understood. M.Cortavius Montesinos, LPN.

## 2022-05-06 NOTE — Progress Notes (Unsigned)
Cardiology Office Note  Date:  05/08/2022   ID:  [name], DOB Nov 04, 1940, MRN [id number]  PCP:  Marguarite Arbour, MD   Chief Complaint  Patient presents with   6 month follow up     "Doing well." Medications reviewed by the patient verbally.      HPI:  [name] is a very pleasant 81 year old male with a history of coronary artery disease,  stenting of the LAD and right coronary artery with drug-eluting stents. Catheterization in April 2005, which showed moderate diffuse nonobstructive disease.   bypass surgery x 4 at [facility] 05/2015 For severe 3 vessel dz postoperative atrial fibrillation hypertension,  hyperlipidemia,  kidney stones Never smoked Hx of bronchitis He presents today for follow-up of his coronary artery disease, paroxysmal atrial fibrillation  Last seen in clinic by myself April 2023  Back is better, hip pain better after shots Does yard work Weight stable  No chest pain concerning for angina No arrhythmia on the amiodarone and metoprolol  Lasb reviewed Total chol 120, LDL 66 CR 1.4, BUN 27 A1C 5.6  EKG personally reviewed by myself on todays visit Normal sinus rhythm rate 58 bpm nonspecific interventricular conduction delay no ST or T wave changes  Past medical history reviewed 08/20/20  in atrial fibrillation with RVR at 115 bpm.    started on Eliquis 5 mg twice daily due to CHA2DS2-VASc of at least 4 (agex2, CAD, hypertension).    Toprol  to 50 mg daily.    Echocardiogram 08/24/20 showing LVEf 50-55%, moderate LVH, grade 1 diastolic dysfunction, RV SF moderately reduced, RV mildly enlarged, bilateral atrium mildly dilated, mild MR, mild to moderate aortic valve sclerosis without stenosis, mild dilation of ascending aorta 41 mm, mildly dilated pulmonary artery.   He is been seen by pulmonary, had CT scan 02/08/2015, showing mild interstitial change  positive stress test (anterior wall ) at the end of 2016 (for chest pain and shortness of  breath) leading to cardiac catheterization showing three-vessel disease, severe ostial LAD disease Sent to [facility] for a CABG He had postoperative atrial fibrillation   Myoview in April 2008 which showed a 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 EF 54%.   severe diarrhea October 2015 and was put in the hospital for dehydration He developed the flu in February 2016   Previously had problems with chronic dizziness which he attributes to in her ear problem.  he is working full-time job.    PMH:   has a past medical history of Arthritis, CAD (coronary artery disease), ED (erectile dysfunction) of non-organic origin, Essential hypertension, History of kidney stones, History of pneumonia, Hyperlipidemia, mixed, Kidney stones, Leg cramps, Myocardial infarction ([facility]) (2000), and Post-operative atrial fibrillation.  PSH:    Past Surgical History:  Procedure Laterality Date   BACK SURGERY     2 lunbar surgeries   CARDIAC CATHETERIZATION N/A 04/30/2015   Procedure: Left Heart Cath;  Surgeon: Antonieta Iba, MD;  Location: [facility] INVASIVE CV LAB;  Service: Cardiovascular;  Laterality: N/A;   COLONOSCOPY WITH PROPOFOL N/A 08/22/2017   Procedure: COLONOSCOPY WITH PROPOFOL;  Surgeon: Toledo, Boykin Nearing, MD;  Location: [facility] ENDOSCOPY;  Service: Gastroenterology;  Laterality: N/A;   CORONARY ANGIOPLASTY     3 stents   CORONARY ARTERY BYPASS GRAFT N/A 05/10/2015   Procedure: CORONARY ARTERY BYPASS GRAFTING (CABG), ON PUMP, TIMES FOUR, USING LEFT INTERNAL MAMMARY ARTERY, BILATERAL GREATER SAPHENOUS VEINS HARVESTED ENDOSCOPICALLY, WITH CORONARY ENDARTERECTOMY;  Surgeon: Kerin Perna, MD;  Location: [facility] OR;  Service: Open Heart Surgery;  Laterality: N/A;   RETINAL DETACHMENT SURGERY Left    TEE WITHOUT CARDIOVERSION N/A 05/10/2015   Procedure: TRANSESOPHAGEAL ECHOCARDIOGRAM (TEE);  Surgeon: Ivin Poot, MD;  Location: [place];  Service: Open Heart Surgery;  Laterality: N/A;   TOOTH EXTRACTION       Current Outpatient Medications  Medication Sig Dispense Refill   amiodarone (PACERONE) 200 MG tablet Take 1 tablet (200 mg) by mouth once daily 90 tablet 1   apixaban (ELIQUIS) 5 MG TABS tablet Take 1 tablet (5 mg total) by mouth 2 (two) times daily. Need appt w/ Dr. Rockey Situ for more refills 60 tablet 6   clobetasol cream (TEMOVATE) 0.05 % SMARTSIG:1 Topical Every Night     diazepam (VALIUM) 5 MG tablet TAKE 1 TABLET (5 MG TOTAL) BY MOUTH EVERY 8 (EIGHT) HOURS AS NEEDED FOR ANXIETY FOR UP TO 10 DAYS     fexofenadine (ALLEGRA) 180 MG tablet Take 180 mg by mouth as needed.      furosemide (LASIX) 20 MG tablet TAKE 1 TABLET BY MOUTH EVERY DAY 90 tablet 0   ibuprofen (ADVIL) 600 MG tablet Take 600 mg by mouth every 4 (four) hours as needed.     losartan (COZAAR) 25 MG tablet Take 1 tablet (25 mg total) by mouth daily. 90 tablet 3   metoprolol succinate (TOPROL-XL) 50 MG 24 hr tablet Take 1 tablet (50 mg total) by mouth daily. 30 tablet 3   simvastatin (ZOCOR) 40 MG tablet Take 1 tablet (40 mg total) by mouth daily. 90 tablet 3   No current facility-administered medications for this visit.     Allergies:   Codeine and Lisinopril   Social History:  The patient  reports that he has never smoked. He has never used smokeless tobacco. He reports that he does not drink alcohol and does not use drugs.   Family History:   Family history is unknown by patient.    Review of Systems: Review of Systems  Constitutional:  Positive for malaise/fatigue.  HENT: Negative.    Respiratory: Negative.    Cardiovascular: Negative.   Gastrointestinal: Negative.   Musculoskeletal:  Positive for back pain and joint pain.  Neurological: Negative.   Psychiatric/Behavioral: Negative.    All other systems reviewed and are negative.    PHYSICAL EXAM: VS:  BP (!) 140/70 (BP Location: Left Arm, Patient Position: Sitting, Cuff Size: Normal)   Pulse (!) 58   Ht 6' (1.829 m)   Wt 220 lb 4 oz (99.9 kg)   SpO2  96%   BMI 29.87 kg/m  , BMI Body mass index is 29.87 kg/m. Constitutional:  oriented to person, place, and time. No distress.  HENT:  Head: Grossly normal Eyes:  no discharge. No scleral icterus.  Neck: No JVD, no carotid bruits  Cardiovascular: Regular rate and rhythm, no murmurs appreciated Pulmonary/Chest: Clear to auscultation bilaterally, no wheezes or rails Abdominal: Soft.  no distension.  no tenderness.  Musculoskeletal: Normal range of motion Neurological:  normal muscle tone. Coordination normal. No atrophy Skin: Skin warm and dry Psychiatric: normal affect, pleasant   Recent Labs: No results found for requested labs within last 365 days.    Lipid Panel No results found for: "CHOL", "HDL", "LDLCALC", "TRIG"    Wt Readings from Last 3 Encounters:  05/08/22 220 lb 4 oz (99.9 kg)  10/25/21 224 lb (101.6 kg)  11/08/20 227 lb 2 oz (103 kg)  ASSESSMENT AND PLAN:  Essential hypertension -  Blood pressure is well controlled on today's visit. No changes made to the medications.  Atherosclerosis of native coronary artery of native heart with angina pectoris (HCC) Currently with no symptoms of angina. No further workup at this time. Continue current medication regimen.  Hyperlipidemia LDL goal <70 - Plan: EKG 12-Lead Cholesterol is at goal on the current lipid regimen. No changes to the medications were made.  S/P CABG x 4 - Plan: EKG 12-Lead nonsmoker cholesterol in the past at goal On eliquis, off asa and plavix No angina Feels well  Shortness of breath Active, exercises, walks  Paroxysmal atrial fibrillation Continue Eliquis, metoprolol, amiodarone No sx   Total encounter time more than 30 minutes  Greater than 50% was spent in counseling and coordination of care with the patient    Orders Placed This Encounter  Procedures   EKG 12-Lead     Signed, Dossie Arbour, M.D., Ph.D. 05/08/2022  [facility] [place],  [place] [phone]

## 2022-05-08 ENCOUNTER — Ambulatory Visit: Payer: Medicare Other | Attending: Cardiovascular Disease | Admitting: Cardiovascular Disease

## 2022-05-08 ENCOUNTER — Encounter: Payer: Self-pay | Admitting: Cardiovascular Disease

## 2022-05-08 VITALS — BP 140/70 | HR 58 | Ht 72.0 in | Wt 220.2 lb

## 2022-05-08 DIAGNOSIS — I77810 Thoracic aortic ectasia: Secondary | ICD-10-CM | POA: Diagnosis not present

## 2022-05-08 DIAGNOSIS — I25118 Atherosclerotic heart disease of native coronary artery with other forms of angina pectoris: Secondary | ICD-10-CM | POA: Diagnosis not present

## 2022-05-08 DIAGNOSIS — I10 Essential (primary) hypertension: Secondary | ICD-10-CM

## 2022-05-08 DIAGNOSIS — E785 Hyperlipidemia, unspecified: Secondary | ICD-10-CM

## 2022-05-08 DIAGNOSIS — I480 Paroxysmal atrial fibrillation: Secondary | ICD-10-CM | POA: Diagnosis not present

## 2022-05-08 DIAGNOSIS — I4891 Unspecified atrial fibrillation: Secondary | ICD-10-CM

## 2022-05-08 MED ORDER — METOPROLOL SUCCINATE ER 50 MG PO TB24: 50.0000 mg | ORAL_TABLET | Freq: Every day | ORAL | 2 refills | Status: DC

## 2022-05-08 MED ORDER — SIMVASTATIN 40 MG PO TABS: 40.0000 mg | ORAL_TABLET | Freq: Every day | ORAL | 2 refills | Status: AC

## 2022-05-08 MED ORDER — LOSARTAN POTASSIUM 25 MG PO TABS: 25.0000 mg | ORAL_TABLET | Freq: Every day | ORAL | 2 refills | Status: DC

## 2022-05-08 MED ORDER — AMIODARONE HCL 200 MG PO TABS: ORAL_TABLET | ORAL | 2 refills | Status: DC

## 2022-05-08 MED ORDER — FUROSEMIDE 20 MG PO TABS: 20.0000 mg | ORAL_TABLET | Freq: Every day | ORAL | 2 refills | Status: DC

## 2022-05-08 NOTE — Patient Instructions (Addendum)
Medication Instructions:  No changes  If you need a refill on your cardiac medications before your next appointment, please call your pharmacy.    Lab work: No new labs needed   Testing/Procedures: No new testing needed   Follow-Up: At [facility], you and your health needs are our priority.  As part of our continuing mission to provide you with exceptional heart care, we have created designated Provider Care Teams.  These Care Teams include your primary Cardiologist (physician) and Advanced Practice Providers (APPs -  Physician Assistants and Nurse Practitioners) who all work together to provide you with the care you need, when you need it.  You will need a follow up appointment in 6 months  Providers on your designated Care Team:   Christopher Berge, NP Ryan Dunn, PA-C Cadence Furth, PA-C  COVID-19 Vaccine Information can be found at: [email or web address] For questions related to vaccine distribution or appointments, please email [email or web address] or call [phone].   

## 2022-05-11 ENCOUNTER — Other Ambulatory Visit: Payer: Self-pay | Admitting: Cardiovascular Disease

## 2022-05-11 DIAGNOSIS — I4891 Unspecified atrial fibrillation: Secondary | ICD-10-CM

## 2022-05-11 NOTE — Telephone Encounter (Signed)
Prescription refill request for Eliquis received. Indication:Afib Last office visit:10/23 Scr:1.4 Age: 81 Weight:99.9 kg  Prescription refilled

## 2022-05-11 NOTE — Telephone Encounter (Signed)
Refill request

## 2022-05-23 ENCOUNTER — Encounter: Payer: Self-pay | Admitting: Cardiovascular Disease

## 2022-05-29 ENCOUNTER — Telehealth: Payer: Self-pay | Admitting: Cardiovascular Disease

## 2022-05-29 DIAGNOSIS — I4891 Unspecified atrial fibrillation: Secondary | ICD-10-CM

## 2022-05-29 MED ORDER — METOPROLOL SUCCINATE ER 25 MG PO TB24: 25.0000 mg | ORAL_TABLET | Freq: Every evening | ORAL | 11 refills | Status: DC

## 2022-05-29 NOTE — Telephone Encounter (Signed)
Pt's daughter updated with MD's recommendations and verbalized understanding.  Medication list updated   Antonieta Iba, MD  Cv Div [place] Triage10 minutes ago (1:41 PM)    We never held the medication in fact previous office visits May and October, metoprolol succinate 50 daily listed on his medication list Metoprolol used to control atrial fibrillation Certainly possible his body is getting used to it as he just restarted He could consider cutting the pill in half take 25 mg instead of 50 Would recommend he monitor blood pressure and heart rate on the medication Could take at dinnertime in the evening to minimize side effects Thx TGollan

## 2022-05-29 NOTE — Telephone Encounter (Signed)
Pt's daughter called stating in April pt was under the impression he was supposed to stop Metoprolol. However, daughter reported pt restarted medication Wednesday, but have felt terrible since. She reported pt is c/o feeling dizzy and extremely weak. She stated pt normally is able to walk on the treadmill but hasn't had the energy. She reported pt is having to force himself to work. No vitals to report  Daughter also report pt has gained 4 lbs over the weekend but denies swelling or SOB. She stated pt hasn't changed his eating habits and taking lasix 20 mg daily as prescribed.  Will forward to MD for recommendations.

## 2022-05-29 NOTE — Telephone Encounter (Signed)
Pt c/o medication issue:  1. Name of Medication: metoprolol succinate (TOPROL-XL) 50 MG 24 hr tablet   2. How are you currently taking this medication (dosage and times per day)?  Take 1 tablet (50 mg total) by mouth daily.   3. Are you having a reaction (difficulty breathing--STAT)?  no  4. What is your medication issue? Patient's daughter called stating ever since he went back on this medication, he has been feeling dizzy, no energy, has gained 4lbs. Patient's daughter states he said symptoms are getting worse since he has been back on the medication.

## 2022-07-31 ENCOUNTER — Encounter: Payer: Self-pay | Admitting: Cardiovascular Disease

## 2022-08-08 DIAGNOSIS — K080 Exfoliation of teeth due to systemic causes: Secondary | ICD-10-CM | POA: Diagnosis not present

## 2022-09-26 DIAGNOSIS — I25810 Atherosclerosis of coronary artery bypass graft(s) without angina pectoris: Secondary | ICD-10-CM | POA: Diagnosis not present

## 2022-09-26 DIAGNOSIS — I10 Essential (primary) hypertension: Secondary | ICD-10-CM | POA: Diagnosis not present

## 2022-09-26 DIAGNOSIS — Z125 Encounter for screening for malignant neoplasm of prostate: Secondary | ICD-10-CM | POA: Diagnosis not present

## 2022-09-26 DIAGNOSIS — E782 Mixed hyperlipidemia: Secondary | ICD-10-CM | POA: Diagnosis not present

## 2022-09-26 DIAGNOSIS — Z0000 Encounter for general adult medical examination without abnormal findings: Secondary | ICD-10-CM | POA: Diagnosis not present

## 2022-09-26 DIAGNOSIS — I480 Paroxysmal atrial fibrillation: Secondary | ICD-10-CM | POA: Diagnosis not present

## 2022-09-26 DIAGNOSIS — Z79899 Other long term (current) drug therapy: Secondary | ICD-10-CM | POA: Diagnosis not present

## 2022-10-05 DIAGNOSIS — Z961 Presence of intraocular lens: Secondary | ICD-10-CM | POA: Diagnosis not present

## 2022-10-05 DIAGNOSIS — H43813 Vitreous degeneration, bilateral: Secondary | ICD-10-CM | POA: Diagnosis not present

## 2022-10-05 DIAGNOSIS — D3132 Benign neoplasm of left choroid: Secondary | ICD-10-CM | POA: Diagnosis not present

## 2022-10-05 DIAGNOSIS — H3320 Serous retinal detachment, unspecified eye: Secondary | ICD-10-CM | POA: Diagnosis not present

## 2022-10-24 DIAGNOSIS — Z79899 Other long term (current) drug therapy: Secondary | ICD-10-CM | POA: Diagnosis not present

## 2022-11-03 ENCOUNTER — Other Ambulatory Visit: Payer: Self-pay | Admitting: Cardiovascular Disease

## 2022-11-06 NOTE — Progress Notes (Signed)
Cardiology Office Note  Date:  11/07/2022   ID:  [name], DOB 1940/07/27, MRN [id number]  PCP:  Marguarite Arbour, MD   Chief Complaint  Patient presents with   6 month follow up     Patient c/o shortness of breath with over exertion. Medications reviewed by the patient verbally.     HPI:  [name] is a very pleasant 82 year old male with a history of coronary artery disease,  stenting of the LAD and right coronary artery with drug-eluting stents. Catheterization in April 2005, which showed moderate diffuse nonobstructive disease.   bypass surgery x 4 at [facility] 05/2015 For severe 3 vessel dz postoperative atrial fibrillation hypertension,  hyperlipidemia,  kidney stones Never smoked Hx of bronchitis He presents today for follow-up of his coronary artery disease, paroxysmal atrial fibrillation  Last seen in clinic by myself October 2023 Some SOB with exertion, for some time had stopped his exercise program Recently back on his treadmill exercising, walking 1 mile per day, treadmill on incline Back problems, can't walk as much  On lasix 20 daily CR 1.3 No edema Having some orthostasis symptoms  BP at home: 140s  Labs reviewed Total chol 114,LDL 51 HGB 12.8  EKG personally reviewed by myself on todays visit Sinus bradycardia rate 53 bpm ivcd  Past medical history reviewed 08/20/20  in atrial fibrillation with RVR at 115 bpm.    started on Eliquis 5 mg twice daily due to CHA2DS2-VASc of at least 4 (agex2, CAD, hypertension).    Toprol  to 50 mg daily.    Echocardiogram 08/24/20 showing LVEf 50-55%, moderate LVH, grade 1 diastolic dysfunction, RV SF moderately reduced, RV mildly enlarged, bilateral atrium mildly dilated, mild MR, mild to moderate aortic valve sclerosis without stenosis, mild dilation of ascending aorta 41 mm, mildly dilated pulmonary artery.   He is been seen by pulmonary, had CT scan 02/08/2015, showing mild interstitial change  positive stress  test (anterior wall ) at the end of 2016 (for chest pain and shortness of breath) leading to cardiac catheterization showing three-vessel disease, severe ostial LAD disease Sent to [facility] for a CABG He had postoperative atrial fibrillation   Myoview in April 2008 which showed a 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 EF 54%.   severe diarrhea October 2015 and was put in the hospital for dehydration He developed the flu in February 2016   Previously had problems with chronic dizziness which he attributes to in her ear problem.  he is working full-time job.    PMH:   has a past medical history of Arthritis, CAD (coronary artery disease), ED (erectile dysfunction) of non-organic origin, Essential hypertension, History of kidney stones, History of pneumonia, Hyperlipidemia, mixed, Kidney stones, Leg cramps, Myocardial infarction (HCC) (2000), and Post-operative atrial fibrillation.  PSH:    Past Surgical History:  Procedure Laterality Date   BACK SURGERY     2 lunbar surgeries   CARDIAC CATHETERIZATION N/A 04/30/2015   Procedure: Left Heart Cath;  Surgeon: Antonieta Iba, MD;  Location: [facility] INVASIVE CV LAB;  Service: Cardiovascular;  Laterality: N/A;   COLONOSCOPY WITH PROPOFOL N/A 08/22/2017   Procedure: COLONOSCOPY WITH PROPOFOL;  Surgeon: Toledo, Boykin Nearing, MD;  Location: [facility] ENDOSCOPY;  Service: Gastroenterology;  Laterality: N/A;   CORONARY ANGIOPLASTY     3 stents   CORONARY ARTERY BYPASS GRAFT N/A 05/10/2015   Procedure: CORONARY ARTERY BYPASS GRAFTING (CABG), ON PUMP, TIMES FOUR, USING LEFT INTERNAL MAMMARY ARTERY, BILATERAL GREATER SAPHENOUS VEINS  HARVESTED ENDOSCOPICALLY, WITH CORONARY ENDARTERECTOMY;  Surgeon: Kerin Perna, MD;  Location: [facility] OR;  Service: Open Heart Surgery;  Laterality: N/A;   RETINAL DETACHMENT SURGERY Left    TEE WITHOUT CARDIOVERSION N/A 05/10/2015   Procedure: TRANSESOPHAGEAL ECHOCARDIOGRAM (TEE);  Surgeon: Kerin Perna, MD;  Location: [facility]  OR;  Service: Open Heart Surgery;  Laterality: N/A;   TOOTH EXTRACTION      Current Outpatient Medications  Medication Sig Dispense Refill   amiodarone (PACERONE) 200 MG tablet Take 200 mg by mouth daily.     apixaban (ELIQUIS) 5 MG TABS tablet Take 1 tablet (5 mg total) by mouth 2 (two) times daily. 60 tablet 6   clobetasol cream (TEMOVATE) 0.05 % SMARTSIG:1 Topical Every Night     diazepam (VALIUM) 5 MG tablet TAKE 1 TABLET (5 MG TOTAL) BY MOUTH EVERY 8 (EIGHT) HOURS AS NEEDED FOR ANXIETY FOR UP TO 10 DAYS     fexofenadine (ALLEGRA) 180 MG tablet Take 180 mg by mouth as needed.      furosemide (LASIX) 20 MG tablet Take 1 tablet (20 mg total) by mouth daily. 90 tablet 2   ibuprofen (ADVIL) 600 MG tablet Take 600 mg by mouth every 4 (four) hours as needed.     losartan (COZAAR) 25 MG tablet Take 1 tablet (25 mg total) by mouth daily. 90 tablet 2   metoprolol succinate (TOPROL-XL) 25 MG 24 hr tablet Take 1 tablet (25 mg total) by mouth every evening. 30 tablet 11   simvastatin (ZOCOR) 40 MG tablet Take 1 tablet (40 mg total) by mouth daily. 90 tablet 2   No current facility-administered medications for this visit.     Allergies:   Codeine and Lisinopril   Social History:  The patient  reports that he has never smoked. He has never used smokeless tobacco. He reports that he does not drink alcohol and does not use drugs.   Family History:   Family history is unknown by patient.    Review of Systems: Review of Systems  Constitutional:  Positive for malaise/fatigue.  HENT: Negative.    Respiratory: Negative.    Cardiovascular: Negative.   Gastrointestinal: Negative.   Musculoskeletal:  Positive for back pain and joint pain.  Neurological: Negative.   Psychiatric/Behavioral: Negative.    All other systems reviewed and are negative.   PHYSICAL EXAM: VS:  BP (!) 148/72 (BP Location: Left Arm, Patient Position: Sitting, Cuff Size: Normal)   Pulse (!) 53   Ht 6' (1.829 m)   Wt 226  lb 4 oz (102.6 kg)   SpO2 96%   BMI 30.69 kg/m  , BMI Body mass index is 30.69 kg/m. Constitutional:  oriented to person, place, and time. No distress.  HENT:  Head: Grossly normal Eyes:  no discharge. No scleral icterus.  Neck: No JVD, no carotid bruits  Cardiovascular: Regular rate and rhythm, no murmurs appreciated Pulmonary/Chest: Clear to auscultation bilaterally, no wheezes or rails Abdominal: Soft.  no distension.  no tenderness.  Musculoskeletal: Normal range of motion Neurological:  normal muscle tone. Coordination normal. No atrophy Skin: Skin warm and dry Psychiatric: normal affect, pleasant   Recent Labs: No results found for requested labs within last 365 days.    Lipid Panel No results found for: "CHOL", "HDL", "LDLCALC", "TRIG"    Wt Readings from Last 3 Encounters:  11/07/22 226 lb 4 oz (102.6 kg)  05/08/22 220 lb 4 oz (99.9 kg)  10/25/21 224 lb (101.6 kg)  ASSESSMENT AND PLAN:  Essential hypertension -  Blood pressure is well controlled on today's visit. No changes made to the medications.  Atherosclerosis of native coronary artery of native heart with angina pectoris (HCC) Currently with no symptoms of angina. No further workup at this time. Continue current medication regimen.  Hyperlipidemia LDL goal <70 - Plan: EKG 12-Lead Cholesterol is at goal on the current lipid regimen. No changes to the medications were made.  S/P CABG x 4 - Plan: EKG 12-Lead nonsmoker cholesterol at goal On eliquis, off asa and plavix  Shortness of breath Active, exercises, walks Recommend he decrease Lasix down to 20 mg 5 days a week given prerenal state, orthostasis symptoms  Paroxysmal atrial fibrillation Maintaining normal sinus rhythm Continue Eliquis, metoprolol, amiodarone Normal TSH No sx  Essential hypertension Discussed increasing losartan but will hold off given his orthostasis symptoms Possibly exacerbated by bradycardia He will hydrate more and  see if symptoms resolve Will decrease Lasix down to 5 days a week from 7 For high blood pressure could increase losartan up to 50 daily   Total encounter time more than 30 minutes  Greater than 50% was spent in counseling and coordination of care with the patient    No orders of the defined types were placed in this encounter.    Signed, Dossie Arbour, M.D., Ph.D. 11/07/2022  [facility] [place], [place] [phone]

## 2022-11-07 ENCOUNTER — Encounter: Payer: Self-pay | Admitting: Cardiovascular Disease

## 2022-11-07 ENCOUNTER — Ambulatory Visit: Payer: Medicare Other | Attending: Cardiovascular Disease | Admitting: Cardiovascular Disease

## 2022-11-07 VITALS — BP 148/80 | HR 53 | Ht 72.0 in | Wt 226.2 lb

## 2022-11-07 DIAGNOSIS — I480 Paroxysmal atrial fibrillation: Secondary | ICD-10-CM

## 2022-11-07 DIAGNOSIS — E785 Hyperlipidemia, unspecified: Secondary | ICD-10-CM | POA: Diagnosis not present

## 2022-11-07 DIAGNOSIS — I4891 Unspecified atrial fibrillation: Secondary | ICD-10-CM

## 2022-11-07 DIAGNOSIS — I10 Essential (primary) hypertension: Secondary | ICD-10-CM

## 2022-11-07 DIAGNOSIS — I77810 Thoracic aortic ectasia: Secondary | ICD-10-CM

## 2022-11-07 DIAGNOSIS — I25118 Atherosclerotic heart disease of native coronary artery with other forms of angina pectoris: Secondary | ICD-10-CM | POA: Diagnosis not present

## 2022-11-07 MED ORDER — FUROSEMIDE 20 MG PO TABS: 20.0000 mg | ORAL_TABLET | Freq: Every day | ORAL | 2 refills | Status: DC

## 2022-11-07 NOTE — Patient Instructions (Addendum)
Medication Instructions:  Please decrease the furosemide/lasix down to 5 days a week (hold lasix on the weekends)  If you need a refill on your cardiac medications before your next appointment, please call your pharmacy.   Lab work: No new labs needed  Testing/Procedures: No new testing needed  Follow-Up: At [facility], you and your health needs are our priority.  As part of our continuing mission to provide you with exceptional heart care, we have created designated Provider Care Teams.  These Care Teams include your primary Cardiologist (physician) and Advanced Practice Providers (APPs -  Physician Assistants and Nurse Practitioners) who all work together to provide you with the care you need, when you need it.  You will need a follow up appointment in 12 months  Providers on your designated Care Team:   Nicolasa Ducking, NP Eula Listen, PA-C Cadence Fransico Michael, [place]  COVID-19 Vaccine Information can be found at: [email or web address] For questions related to vaccine distribution or appointments, please email [email or web address] or call [phone].

## 2022-12-20 DIAGNOSIS — L720 Epidermal cyst: Secondary | ICD-10-CM | POA: Diagnosis not present

## 2022-12-20 DIAGNOSIS — L570 Actinic keratosis: Secondary | ICD-10-CM | POA: Diagnosis not present

## 2022-12-26 DIAGNOSIS — Z79899 Other long term (current) drug therapy: Secondary | ICD-10-CM | POA: Diagnosis not present

## 2023-01-28 ENCOUNTER — Other Ambulatory Visit: Payer: Self-pay | Admitting: Cardiovascular Disease

## 2023-01-28 DIAGNOSIS — I4891 Unspecified atrial fibrillation: Secondary | ICD-10-CM

## 2023-01-29 NOTE — Telephone Encounter (Signed)
Prescription refill request for Eliquis received. Indication:afib Last office visit:4/24 Scr:1.3  3/24 Age: 82 Weight:102.6  kg  Prescription refilled

## 2023-01-29 NOTE — Telephone Encounter (Signed)
Refill Request.  

## 2023-02-11 ENCOUNTER — Other Ambulatory Visit: Payer: Self-pay | Admitting: Cardiovascular Disease

## 2023-03-30 DIAGNOSIS — Z79899 Other long term (current) drug therapy: Secondary | ICD-10-CM | POA: Diagnosis not present

## 2023-03-30 DIAGNOSIS — E782 Mixed hyperlipidemia: Secondary | ICD-10-CM | POA: Diagnosis not present

## 2023-03-30 DIAGNOSIS — I2510 Atherosclerotic heart disease of native coronary artery without angina pectoris: Secondary | ICD-10-CM | POA: Diagnosis not present

## 2023-03-30 DIAGNOSIS — Z0000 Encounter for general adult medical examination without abnormal findings: Secondary | ICD-10-CM | POA: Diagnosis not present

## 2023-03-30 DIAGNOSIS — E785 Hyperlipidemia, unspecified: Secondary | ICD-10-CM | POA: Diagnosis not present

## 2023-03-30 DIAGNOSIS — I10 Essential (primary) hypertension: Secondary | ICD-10-CM | POA: Diagnosis not present

## 2023-03-30 DIAGNOSIS — I480 Paroxysmal atrial fibrillation: Secondary | ICD-10-CM | POA: Diagnosis not present

## 2023-05-04 DIAGNOSIS — I10 Essential (primary) hypertension: Secondary | ICD-10-CM | POA: Diagnosis not present

## 2023-05-04 DIAGNOSIS — I480 Paroxysmal atrial fibrillation: Secondary | ICD-10-CM | POA: Diagnosis not present

## 2023-05-04 DIAGNOSIS — I2510 Atherosclerotic heart disease of native coronary artery without angina pectoris: Secondary | ICD-10-CM | POA: Diagnosis not present

## 2023-05-04 DIAGNOSIS — E785 Hyperlipidemia, unspecified: Secondary | ICD-10-CM | POA: Diagnosis not present

## 2023-05-11 ENCOUNTER — Ambulatory Visit: Payer: Medicare Other

## 2023-05-11 DIAGNOSIS — Z23 Encounter for immunization: Secondary | ICD-10-CM

## 2023-05-11 DIAGNOSIS — Z719 Counseling, unspecified: Secondary | ICD-10-CM

## 2023-05-11 NOTE — Progress Notes (Signed)
In nurse clinic, requesting Flu vaccine high dose. Eligible per [facility]. Administered, tolerated well. Given VIS and an updated [facility] copy, explained and understood. M.Cande Mastropietro, LPN.

## 2023-06-11 DIAGNOSIS — M7581 Other shoulder lesions, right shoulder: Secondary | ICD-10-CM | POA: Diagnosis not present

## 2023-06-11 DIAGNOSIS — M12811 Other specific arthropathies, not elsewhere classified, right shoulder: Secondary | ICD-10-CM | POA: Diagnosis not present

## 2023-06-11 DIAGNOSIS — M75121 Complete rotator cuff tear or rupture of right shoulder, not specified as traumatic: Secondary | ICD-10-CM | POA: Diagnosis not present

## 2023-08-22 ENCOUNTER — Other Ambulatory Visit: Payer: Self-pay | Admitting: Cardiovascular Disease

## 2023-09-26 ENCOUNTER — Other Ambulatory Visit: Payer: Self-pay | Admitting: Cardiovascular Disease

## 2023-09-26 NOTE — Telephone Encounter (Signed)
 Good Morning,  Could you please schedule this patient a 12 month follow up visit? The patient was last seen by Dr. Mariah Milling on 11-07-22. Thank you so much.

## 2023-09-28 NOTE — Telephone Encounter (Signed)
Pt scheduled on 5/13

## 2023-10-04 DIAGNOSIS — Z125 Encounter for screening for malignant neoplasm of prostate: Secondary | ICD-10-CM | POA: Diagnosis not present

## 2023-10-04 DIAGNOSIS — Z79899 Other long term (current) drug therapy: Secondary | ICD-10-CM | POA: Diagnosis not present

## 2023-10-04 DIAGNOSIS — I480 Paroxysmal atrial fibrillation: Secondary | ICD-10-CM | POA: Diagnosis not present

## 2023-10-04 DIAGNOSIS — I10 Essential (primary) hypertension: Secondary | ICD-10-CM | POA: Diagnosis not present

## 2023-10-04 DIAGNOSIS — E538 Deficiency of other specified B group vitamins: Secondary | ICD-10-CM | POA: Diagnosis not present

## 2023-10-04 DIAGNOSIS — Z0000 Encounter for general adult medical examination without abnormal findings: Secondary | ICD-10-CM | POA: Diagnosis not present

## 2023-10-04 DIAGNOSIS — E785 Hyperlipidemia, unspecified: Secondary | ICD-10-CM | POA: Diagnosis not present

## 2023-10-08 ENCOUNTER — Other Ambulatory Visit: Payer: Self-pay | Admitting: Internal Medicine

## 2023-10-08 DIAGNOSIS — H539 Unspecified visual disturbance: Secondary | ICD-10-CM

## 2023-10-08 DIAGNOSIS — E538 Deficiency of other specified B group vitamins: Secondary | ICD-10-CM | POA: Diagnosis not present

## 2023-10-08 DIAGNOSIS — R42 Dizziness and giddiness: Secondary | ICD-10-CM

## 2023-10-09 ENCOUNTER — Other Ambulatory Visit: Payer: Self-pay | Admitting: Cardiovascular Disease

## 2023-10-09 DIAGNOSIS — I4891 Unspecified atrial fibrillation: Secondary | ICD-10-CM

## 2023-10-09 NOTE — Telephone Encounter (Signed)
 Prescription refill request for Eliquis received. Indication:afib Last office visit:4/24 Scr:1.3  3/25 Age: 83 Weight:102.6  kg  Prescription refilled

## 2023-10-10 ENCOUNTER — Ambulatory Visit
Admission: RE | Admit: 2023-10-10 | Discharge: 2023-10-10 | Disposition: A | Discharge status: Home / Self Care | Source: Ambulatory Visit | Attending: Internal Medicine | Admitting: Internal Medicine

## 2023-10-10 DIAGNOSIS — H539 Unspecified visual disturbance: Secondary | ICD-10-CM | POA: Insufficient documentation

## 2023-10-10 DIAGNOSIS — R42 Dizziness and giddiness: Secondary | ICD-10-CM | POA: Diagnosis not present

## 2023-10-15 DIAGNOSIS — E538 Deficiency of other specified B group vitamins: Secondary | ICD-10-CM | POA: Diagnosis not present

## 2023-10-22 DIAGNOSIS — E538 Deficiency of other specified B group vitamins: Secondary | ICD-10-CM | POA: Diagnosis not present

## 2023-10-29 DIAGNOSIS — E538 Deficiency of other specified B group vitamins: Secondary | ICD-10-CM | POA: Diagnosis not present

## 2023-10-30 DIAGNOSIS — R42 Dizziness and giddiness: Secondary | ICD-10-CM | POA: Diagnosis not present

## 2023-10-30 DIAGNOSIS — Z8679 Personal history of other diseases of the circulatory system: Secondary | ICD-10-CM | POA: Diagnosis not present

## 2023-10-30 DIAGNOSIS — I10 Essential (primary) hypertension: Secondary | ICD-10-CM | POA: Diagnosis not present

## 2023-11-11 DIAGNOSIS — S92321A Displaced fracture of second metatarsal bone, right foot, initial encounter for closed fracture: Secondary | ICD-10-CM | POA: Diagnosis not present

## 2023-11-11 DIAGNOSIS — N1831 Chronic kidney disease, stage 3a: Secondary | ICD-10-CM | POA: Diagnosis not present

## 2023-11-11 DIAGNOSIS — R55 Syncope and collapse: Secondary | ICD-10-CM | POA: Diagnosis not present

## 2023-11-11 DIAGNOSIS — M19072 Primary osteoarthritis, left ankle and foot: Secondary | ICD-10-CM | POA: Diagnosis not present

## 2023-11-11 DIAGNOSIS — I452 Bifascicular block: Secondary | ICD-10-CM | POA: Diagnosis not present

## 2023-11-11 DIAGNOSIS — S92241A Displaced fracture of medial cuneiform of right foot, initial encounter for closed fracture: Secondary | ICD-10-CM | POA: Diagnosis not present

## 2023-11-11 DIAGNOSIS — I4510 Unspecified right bundle-branch block: Secondary | ICD-10-CM | POA: Diagnosis not present

## 2023-11-11 DIAGNOSIS — S92311D Displaced fracture of first metatarsal bone, right foot, subsequent encounter for fracture with routine healing: Secondary | ICD-10-CM | POA: Diagnosis not present

## 2023-11-11 DIAGNOSIS — Z79899 Other long term (current) drug therapy: Secondary | ICD-10-CM | POA: Diagnosis not present

## 2023-11-11 DIAGNOSIS — S92511A Displaced fracture of proximal phalanx of right lesser toe(s), initial encounter for closed fracture: Secondary | ICD-10-CM | POA: Diagnosis not present

## 2023-11-11 DIAGNOSIS — I10 Essential (primary) hypertension: Secondary | ICD-10-CM | POA: Diagnosis not present

## 2023-11-11 DIAGNOSIS — Z885 Allergy status to narcotic agent status: Secondary | ICD-10-CM | POA: Diagnosis not present

## 2023-11-11 DIAGNOSIS — E785 Hyperlipidemia, unspecified: Secondary | ICD-10-CM | POA: Diagnosis not present

## 2023-11-11 DIAGNOSIS — R262 Difficulty in walking, not elsewhere classified: Secondary | ICD-10-CM | POA: Diagnosis not present

## 2023-11-11 DIAGNOSIS — I25810 Atherosclerosis of coronary artery bypass graft(s) without angina pectoris: Secondary | ICD-10-CM | POA: Diagnosis not present

## 2023-11-11 DIAGNOSIS — Z7901 Long term (current) use of anticoagulants: Secondary | ICD-10-CM | POA: Diagnosis not present

## 2023-11-11 DIAGNOSIS — I440 Atrioventricular block, first degree: Secondary | ICD-10-CM | POA: Diagnosis not present

## 2023-11-11 DIAGNOSIS — Z8249 Family history of ischemic heart disease and other diseases of the circulatory system: Secondary | ICD-10-CM | POA: Diagnosis not present

## 2023-11-11 DIAGNOSIS — M6281 Muscle weakness (generalized): Secondary | ICD-10-CM | POA: Diagnosis not present

## 2023-11-11 DIAGNOSIS — Z7409 Other reduced mobility: Secondary | ICD-10-CM | POA: Diagnosis not present

## 2023-11-11 DIAGNOSIS — Z951 Presence of aortocoronary bypass graft: Secondary | ICD-10-CM | POA: Diagnosis not present

## 2023-11-11 DIAGNOSIS — I350 Nonrheumatic aortic (valve) stenosis: Secondary | ICD-10-CM | POA: Diagnosis not present

## 2023-11-11 DIAGNOSIS — S92314A Nondisplaced fracture of first metatarsal bone, right foot, initial encounter for closed fracture: Secondary | ICD-10-CM | POA: Diagnosis not present

## 2023-11-11 DIAGNOSIS — I480 Paroxysmal atrial fibrillation: Secondary | ICD-10-CM | POA: Diagnosis not present

## 2023-11-11 DIAGNOSIS — S92331A Displaced fracture of third metatarsal bone, right foot, initial encounter for closed fracture: Secondary | ICD-10-CM | POA: Diagnosis not present

## 2023-11-11 DIAGNOSIS — S99922A Unspecified injury of left foot, initial encounter: Secondary | ICD-10-CM | POA: Diagnosis not present

## 2023-11-11 DIAGNOSIS — S0990XA Unspecified injury of head, initial encounter: Secondary | ICD-10-CM | POA: Diagnosis not present

## 2023-11-11 DIAGNOSIS — I444 Left anterior fascicular block: Secondary | ICD-10-CM | POA: Diagnosis not present

## 2023-11-11 DIAGNOSIS — X58XXXA Exposure to other specified factors, initial encounter: Secondary | ICD-10-CM | POA: Diagnosis not present

## 2023-11-11 DIAGNOSIS — M1711 Unilateral primary osteoarthritis, right knee: Secondary | ICD-10-CM | POA: Diagnosis not present

## 2023-11-12 DIAGNOSIS — I2510 Atherosclerotic heart disease of native coronary artery without angina pectoris: Secondary | ICD-10-CM | POA: Diagnosis not present

## 2023-11-12 DIAGNOSIS — S92321A Displaced fracture of second metatarsal bone, right foot, initial encounter for closed fracture: Secondary | ICD-10-CM | POA: Diagnosis not present

## 2023-11-12 DIAGNOSIS — I350 Nonrheumatic aortic (valve) stenosis: Secondary | ICD-10-CM | POA: Diagnosis not present

## 2023-11-12 DIAGNOSIS — S92331A Displaced fracture of third metatarsal bone, right foot, initial encounter for closed fracture: Secondary | ICD-10-CM | POA: Diagnosis not present

## 2023-11-12 DIAGNOSIS — R55 Syncope and collapse: Secondary | ICD-10-CM | POA: Diagnosis not present

## 2023-11-12 DIAGNOSIS — N1831 Chronic kidney disease, stage 3a: Secondary | ICD-10-CM | POA: Diagnosis not present

## 2023-11-12 DIAGNOSIS — S92511A Displaced fracture of proximal phalanx of right lesser toe(s), initial encounter for closed fracture: Secondary | ICD-10-CM | POA: Diagnosis not present

## 2023-11-12 DIAGNOSIS — I480 Paroxysmal atrial fibrillation: Secondary | ICD-10-CM | POA: Diagnosis not present

## 2023-11-15 DIAGNOSIS — R42 Dizziness and giddiness: Secondary | ICD-10-CM | POA: Diagnosis not present

## 2023-11-15 DIAGNOSIS — R001 Bradycardia, unspecified: Secondary | ICD-10-CM | POA: Diagnosis not present

## 2023-11-15 DIAGNOSIS — S92301D Fracture of unspecified metatarsal bone(s), right foot, subsequent encounter for fracture with routine healing: Secondary | ICD-10-CM | POA: Diagnosis not present

## 2023-11-19 DIAGNOSIS — S92314D Nondisplaced fracture of first metatarsal bone, right foot, subsequent encounter for fracture with routine healing: Secondary | ICD-10-CM | POA: Diagnosis not present

## 2023-11-19 DIAGNOSIS — R55 Syncope and collapse: Secondary | ICD-10-CM | POA: Diagnosis not present

## 2023-11-20 ENCOUNTER — Ambulatory Visit

## 2023-11-20 ENCOUNTER — Encounter: Payer: Self-pay | Admitting: Physician Assistant

## 2023-11-20 ENCOUNTER — Ambulatory Visit: Attending: Physician Assistant | Admitting: Physician Assistant

## 2023-11-20 ENCOUNTER — Ambulatory Visit: Admitting: Cardiovascular Disease

## 2023-11-20 VITALS — BP 156/70 | HR 56 | Ht 73.0 in | Wt 225.0 lb

## 2023-11-20 DIAGNOSIS — E785 Hyperlipidemia, unspecified: Secondary | ICD-10-CM

## 2023-11-20 DIAGNOSIS — I2510 Atherosclerotic heart disease of native coronary artery without angina pectoris: Secondary | ICD-10-CM

## 2023-11-20 DIAGNOSIS — I480 Paroxysmal atrial fibrillation: Secondary | ICD-10-CM

## 2023-11-20 DIAGNOSIS — R42 Dizziness and giddiness: Secondary | ICD-10-CM | POA: Diagnosis not present

## 2023-11-20 DIAGNOSIS — I350 Nonrheumatic aortic (valve) stenosis: Secondary | ICD-10-CM

## 2023-11-20 DIAGNOSIS — N1831 Chronic kidney disease, stage 3a: Secondary | ICD-10-CM

## 2023-11-20 DIAGNOSIS — G4730 Sleep apnea, unspecified: Secondary | ICD-10-CM

## 2023-11-20 DIAGNOSIS — R55 Syncope and collapse: Secondary | ICD-10-CM | POA: Diagnosis not present

## 2023-11-20 DIAGNOSIS — I4891 Unspecified atrial fibrillation: Secondary | ICD-10-CM

## 2023-11-20 DIAGNOSIS — I440 Atrioventricular block, first degree: Secondary | ICD-10-CM

## 2023-11-20 DIAGNOSIS — R001 Bradycardia, unspecified: Secondary | ICD-10-CM

## 2023-11-20 DIAGNOSIS — Z951 Presence of aortocoronary bypass graft: Secondary | ICD-10-CM

## 2023-11-20 DIAGNOSIS — I10 Essential (primary) hypertension: Secondary | ICD-10-CM

## 2023-11-20 DIAGNOSIS — I4510 Unspecified right bundle-branch block: Secondary | ICD-10-CM

## 2023-11-20 DIAGNOSIS — I471 Supraventricular tachycardia, unspecified: Secondary | ICD-10-CM

## 2023-11-20 MED ORDER — METOPROLOL SUCCINATE ER 25 MG PO TB24: 12.5000 mg | ORAL_TABLET | Freq: Every evening | ORAL | 3 refills | Status: DC

## 2023-11-20 NOTE — Patient Instructions (Addendum)
 Medication Instructions:  Your physician recommends the following medication changes.  DECREASE: Metoprolol  to 12.5 mg (1/2 tablet of 25 mg) once daily  *If you need a refill on your cardiac medications before your next appointment, please call your pharmacy*  Testing/Procedures: Your physician has recommended that you wear a Zio monitor.   This monitor is a medical device that records the heart's electrical activity. Doctors most often use these monitors to diagnose arrhythmias. Arrhythmias are problems with the speed or rhythm of the heartbeat. The monitor is a small device applied to your chest. You can wear one while you do your normal daily activities. While wearing this monitor if you have any symptoms to push the button and record what you felt. Once you have worn this monitor for the period of time provider prescribed (Usually 14 days), you will return the monitor device in the postage paid box. Once it is returned they will download the data collected and provide us  with a report which the provider will then review and we will call you with those results. Important tips:  Avoid showering during the first 24 hours of wearing the monitor. Avoid excessive sweating to help maximize wear time. Do not submerge the device, no hot tubs, and no swimming pools. Keep any lotions or oils away from the patch. After 24 hours you may shower with the patch on. Take brief showers with your back facing the shower head.  Do not remove patch once it has been placed because that will interrupt data and decrease adhesive wear time. Push the button when you have any symptoms and write down what you were feeling. Once you have completed wearing your monitor, remove and place into box which has postage paid and place in your outgoing mailbox.  If for some reason you have misplaced your box then call our office and we can provide another box and/or mail it off for you.    Follow-Up: At [facility],  you and your health needs are our priority.  As part of our continuing mission to provide you with exceptional heart care, our providers are all part of one team.  This team includes your primary Cardiologist (physician) and Advanced Practice Providers or APPs (Physician Assistants and Nurse Practitioners) who all work together to provide you with the care you need, when you need it.  Your next appointment:   1 month(s)  Provider:   Varney Gentleman, PA-C    We recommend signing up for the patient portal called "MyChart".  Sign up information is provided on this After Visit Summary.  MyChart is used to connect with patients for Virtual Visits (Telemedicine).  Patients are able to view lab/test results, encounter notes, upcoming appointments, etc.  Non-urgent messages can be sent to your provider as well.   To learn more about what you can do with MyChart, go to [email or web address].   Other Instructions You have been referred to Pulmonary Clinic to do a sleep study.  Please contact them at the following phone number if they don't reach out to you by next Monday.  [address], [place], [place] [address] Phone: [phone]

## 2023-11-20 NOTE — Progress Notes (Signed)
 Cardiology Office Note    Date:  11/20/2023   ID:  EFE BOURLAND, DOB 1941-03-28, MRN [id number]  PCP:  Yehuda Helms, MD  Cardiologist:  Belva Boyden, MD  Electrophysiologist:  None   Chief Complaint: Hospital follow up  History of Present Illness:   [name] is a 83 y.o. male with history of CAD status post prior stenting status post CABG in 2016, PAF on apixaban , PSVT, mild aortic stenosis, CKD stage IIIa, syncope, HTN, and HLD who presents for hospital follow-up as outlined below.  In the early 2000's he underwent PCI to the LAD and RCA.  In 2016, he had recurrent angina with abnormal stress test.  LHC showed severe multivessel CAD leading to four-vessel CABG.  Postoperative course was notable for atrial fibrillation requiring short course of amiodarone .  However, when he was seen in follow-up in 2022 with fatigue he was noted to be in A-fib with RVR leading to discontinuation of aspirin  and clopidogrel  and he was initiated on apixaban .  Echo at that time showed an EF of 50 to 55%, moderate LVH, grade 1 diastolic dysfunction, moderately reduced RV systolic function with mildly enlarged ventricular cavity size, mild biatrial enlargement, mild mitral regurgitation, moderate calcification and thickening of the aortic valve with mild to moderate aortic sclerosis without evidence of stenosis, and mildly dilated ascending aorta measuring 41 mm.  Subsequent outpatient cardiac monitoring in 09/2020 showed a predominant rhythm of sinus with 53 episodes of SVT lasting up to 41 beats, and rare atrial and ventricular ectopy.  No evidence of A-fib.  At baseline he is typically very active and was walking on a treadmill on a regular basis up until development of intermittent dizziness approximately 3 years ago.  He would typically do around 100 push-ups as well as squats on a daily basis.  He reports passing out many years ago when he was a kid in the setting of influenza.  He reports a  second episode of syncope while working in [name]  approximately 10 to 15 years ago after bending over looking into a manhole.  More recently, he reports an approximate 3-year history of intermittent dizziness that is not related to positional changes.  In this setting he underwent MRI of the brain in 10/2023 that showed no acute infarct with chronic small vessel changes.  He was admitted to [name] earlier this month with syncopal episode.  He reports he had gotten up in the morning and performed his usual routine which includes using the restroom and taking a shower.  While exiting the restroom after taking a shower he turned around to weigh himself like he does each morning.  While on the scale, he rotated to step off and felt a sudden onset of dizziness with presyncope.  He grabbed onto the counter and subsequently suffered LOC.  He believes he was without consciousness for less than 1 minute.  No preceding chest pain or tachypalpitations.  Following this he was a little disoriented and unable to get himself to his feet.  He scooted on his buttocks into the bedroom and was able to get himself up off the floor after propping himself on a chair.  He then notified his daughter and son-in-law of this event and he was brought to [facility] ED for evaluation.  High-sensitivity troponin negative x 2.  EKG reported as sinus rhythm, 62 bpm, first-degree AV block, RBBB, LAFB.  Echo showed an EF of greater than 55%, moderately increased LV wall  thickness, grade 1 diastolic dysfunction, mild aortic stenosis, and normal RV systolic function and ventricular cavity size.  Presentation was suggestive of orthostatic hypotension.  There was concern for increased vagal tone during nocturnal hours.  Orthostatic vitals were normal when repeated after admission.  CT of the head was without acute intracranial process.  Patient and daughter report carotid artery ultrasound was reassuring (unavailable for review).   Outpatient Zio patch was recommended.  He comes in today accompanied by his daughter and is without symptoms of angina or cardiac decompensation.  No further passing out episodes.  He does continue to note intermittent dizziness.  No palpitations, lower extremity swelling or progressive orthopnea.  No hematochezia or melena.  Adherent to cardiac medications including amiodarone  200 mg daily, apixaban  5 mg twice daily, furosemide  20 mg Monday through Friday, losartan  25 mg, Toprol -XL 25 mg, and simvastatin  40 mg.  He reports a long history of waking up in the middle of the night with a panic and a sensation of he cannot catch his breath.  His daughter reports significant snoring as well.  This has been ongoing since his bypass in 2016.  No prior sleep study evaluation.  Right foot currently in a walking boot secondary to nondisplaced fractured metatarsal.   Labs independently reviewed: 11/2023 - Hgb 11.9, PLT 132, potassium 4, BUN 27, serum creatinine 1.28, albumin  4, AST/ALT normal, magnesium  2.3 09/2023 - TC 125, TG 47, HDL 42, LDL 73, TSH normal  Past Medical History:  Diagnosis Date   Arthritis    CAD (coronary artery disease)    a. s/p DES to LAD and RCA (2001/2005);  b. 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   ED (erectile dysfunction) of non-organic origin    Essential hypertension    History of kidney stones    History of pneumonia    Hyperlipidemia, mixed    Kidney stones    Leg cramps    Myocardial infarction (HCC) 2000   Post-operative atrial fibrillation    a. 04/2015 following CABG.    Past Surgical History:  Procedure Laterality Date   BACK SURGERY     2 lunbar surgeries   CARDIAC CATHETERIZATION N/A 04/30/2015   Procedure: Left Heart Cath;  Surgeon: Devorah Fonder, MD;  Location: [facility] INVASIVE CV LAB;  Service: Cardiovascular;  Laterality: N/A;   COLONOSCOPY WITH PROPOFOL  N/A 08/22/2017   Procedure: COLONOSCOPY WITH PROPOFOL ;  Surgeon: Toledo, Alphonsus Jeans, MD;  Location: [facility] ENDOSCOPY;  Service: Gastroenterology;  Laterality: N/A;   CORONARY ANGIOPLASTY     3 stents   CORONARY ARTERY BYPASS GRAFT N/A 05/10/2015   Procedure: CORONARY ARTERY BYPASS GRAFTING (CABG), ON PUMP, TIMES FOUR, USING LEFT INTERNAL MAMMARY ARTERY, BILATERAL GREATER SAPHENOUS VEINS HARVESTED ENDOSCOPICALLY, WITH CORONARY ENDARTERECTOMY;  Surgeon: Heriberto London, MD;  Location: [facility] OR;  Service: Open Heart Surgery;  Laterality: N/A;   RETINAL DETACHMENT SURGERY Left    TEE WITHOUT CARDIOVERSION N/A 05/10/2015   Procedure: TRANSESOPHAGEAL ECHOCARDIOGRAM (TEE);  Surgeon: Heriberto London, MD;  Location: [facility] OR;  Service: Open Heart Surgery;  Laterality: N/A;   TOOTH EXTRACTION      Current Medications: Current Meds  Medication Sig   acetaminophen  (TYLENOL ) 500 MG tablet Take 1,000 mg by mouth.   amiodarone  (PACERONE ) 200 MG tablet Take 200 mg by mouth daily.   clobetasol cream (TEMOVATE) 0.05 % SMARTSIG:1 Topical Every Night   diazepam  (VALIUM ) 5 MG tablet TAKE 1 TABLET (5 MG TOTAL) BY MOUTH EVERY 8 (EIGHT)  HOURS AS NEEDED FOR ANXIETY FOR UP TO 10 DAYS   ELIQUIS  5 MG TABS tablet TAKE 1 TABLET BY MOUTH TWICE A DAY   fexofenadine (ALLEGRA) 180 MG tablet Take 180 mg by mouth as needed.    furosemide  (LASIX ) 20 MG tablet TAKE 1 TABLET (20 MG TOTAL) BY MOUTH DAILY. TAKE 5 DAYS A WEEK (HOLD ON THE WEEKENDS)   ibuprofen (ADVIL) 600 MG tablet Take 600 mg by mouth every 4 (four) hours as needed.   losartan  (COZAAR ) 25 MG tablet TAKE 1 TABLET (25 MG TOTAL) BY MOUTH DAILY.   simvastatin  (ZOCOR ) 40 MG tablet Take 1 tablet (40 mg total) by mouth daily.   [DISCONTINUED] metoprolol  succinate (TOPROL -XL) 25 MG 24 hr tablet Take 1 tablet (25 mg total) by mouth every evening.    Allergies:   Codeine and Lisinopril    Social History   Socioeconomic History   Marital status: Widowed    Spouse name: Not on file   Number of children: Not on file   Years of education: Not on file    Highest education level: Not on file  Occupational History   Occupation: OWNER    Employer: [employer]    Comment: Full time  Tobacco Use   Smoking status: Never   Smokeless tobacco: Never   Tobacco comments:    tobacco use- no  Vaping Use   Vaping status: Never Used  Substance and Sexual Activity   Alcohol use: No   Drug use: No   Sexual activity: Not on file  Other Topics Concern   Not on file  Social History Narrative   Full time. Married. Gets regular exercise.    Social Drivers of Corporate investment banker Strain: Low Risk  (11/16/2023)   Received [facility]   Overall Financial Resource Strain (CARDIA)    Difficulty of Paying Living Expenses: Not hard at all  Food Insecurity: No Food Insecurity (11/16/2023)   Received from [facility]   Hunger Vital Sign    Worried About Running Out of Food in the Last Year: Never true    Ran Out of Food in the Last Year: Never true  Transportation Needs: No Transportation Needs (11/16/2023)   Received from [facility] - Transportation    In the past 12 months, has lack of transportation kept you from medical appointments or from getting medications?: No    Lack of Transportation (Non-Medical): No  Physical Activity: Not on file  Stress: Not on file  Social Connections: Not on file     Family History:  The patient's Family history is unknown by patient.  ROS:   12-point review of systems is negative unless otherwise noted in the HPI.   EKGs/Labs/Other Studies Reviewed:    Studies reviewed were summarized above. The additional studies were reviewed today:  2D echo 11/12/2023 [facility]): Summary   1. The left ventricle is normal in size with moderately increased wall  thickness.   2. The left ventricular systolic function is normal, LVEF is visually  estimated at > 55%.    3. There is grade I diastolic dysfunction (impaired relaxation).    4. There is  mild aortic valve stenosis.    5. The right ventricle is normal in size, with normal systolic function.  __________  Zio patch 09/2020: Predominant underlying rhythm was Sinus Rhythm Patient had a min HR of 49 bpm, max HR of 136 bpm, and avg HR of  65 bpm.    53 Supraventricular Tachycardia/atrial tachycardia runs occurred, the run with the fastest interval lasting 41 beats  with a max rate of 136 bpm, the longest lasting 41 beats with an avg rate of 103 bpm.   Isolated SVEs were rare (<1.0%), SVE Couplets were rare (<1.0%), and SVE Triplets were rare (<1.0%). Isolated VEs were rare (<1.0%), VE Couplets were rare (<1.0%), and  no VE Triplets were present. Ventricular Bigeminy and Trigeminy were present.   No patient triggered events noted __________  2D echo 08/24/2020: 1. Left ventricular ejection fraction, by estimation, is 50 to 55%. The  left ventricle has low normal function. Left ventricular endocardial  border not optimally defined to evaluate regional wall motion. There is  moderate left ventricular hypertrophy.  Left ventricular diastolic parameters are consistent with Grade I  diastolic dysfunction (impaired relaxation).   2. Right ventricular systolic function is moderately reduced. The right  ventricular size is mildly enlarged. Tricuspid regurgitation signal is  inadequate for assessing PA pressure.   3. Left atrial size was mildly dilated.   4. Right atrial size was mildly dilated.   5. The mitral valve is grossly normal. Mild mitral valve regurgitation.   6. The aortic valve has an indeterminant number of cusps. There is  moderate calcification of the aortic valve. There is moderate thickening  of the aortic valve. Aortic valve regurgitation is not visualized. Mild to  moderate aortic valve  sclerosis/calcification is present, without any evidence of aortic  stenosis.   7. Aortic dilatation noted. There is mild dilatation of the ascending  aorta, measuring 41 mm.   8.  Mildly dilated pulmonary artery.  __________  See CV Studies and Care Everywhere in Epic for more remote imaging    EKG:  EKG is ordered today.  The EKG ordered today demonstrates sinus bradycardia with first-degree AV block, 57 bpm, RBBB, consistent with prior tracings  Recent Labs: No results found for requested labs within last 365 days.  Recent Lipid Panel No results found for: "CHOL", "TRIG", "HDL", "CHOLHDL", "VLDL", "LDLCALC", "LDLDIRECT"  PHYSICAL EXAM:    VS:  BP (!) 156/70 (BP Location: Left Arm, Patient Position: Sitting, Cuff Size: Normal)   Pulse (!) 56   Ht 6\' 1"  (1.854 m)   Wt 225 lb (102.1 kg)   SpO2 95%   BMI 29.69 kg/m   BMI: Body mass index is 29.69 kg/m.  Physical Exam Vitals reviewed.  Constitutional:      Appearance: He is well-developed.  HENT:     Head: Normocephalic and atraumatic.  Eyes:     General:        Right eye: No discharge.        Left eye: No discharge.  Cardiovascular:     Rate and Rhythm: Normal rate and regular rhythm.     Heart sounds: S1 normal and S2 normal. Heart sounds not distant. No midsystolic click and no opening snap. Murmur heard.     Systolic murmur is present with a grade of 1/6 at the upper right sternal border.     No friction rub.  Pulmonary:     Effort: Pulmonary effort is normal. No respiratory distress.     Breath sounds: Normal breath sounds. No decreased breath sounds, wheezing, rhonchi or rales.  Chest:     Chest wall: No tenderness.  Musculoskeletal:     Cervical back: Normal range of motion.     Comments: Right foot in orthopedic walking boot.  Skin:  General: Skin is warm and dry.     Nails: There is no clubbing.  Neurological:     Mental Status: He is alert and oriented to person, place, and time.  Psychiatric:        Speech: Speech normal.        Behavior: Behavior normal.        Thought Content: Thought content normal.        Judgment: Judgment normal.     Wt Readings from Last 3  Encounters:  11/20/23 225 lb (102.1 kg)  11/07/22 226 lb 4 oz (102.6 kg)  05/08/22 220 lb 4 oz (99.9 kg)     ASSESSMENT & PLAN:   Syncope: Episode is concerning for cardiac etiology whether this be symptomatic bradycardia/sinus node dysfunction versus arrhythmia versus high-grade AV block versus prolonged pause/posttermination pause.  Agree with recommendation to pursue outpatient cardiac monitoring to evaluate for high-grade AV block, prolonged pauses/post-termination pauses, significant sustained arrhythmia, chronotropic incompetence, or symptomatic bradycardia/sinus node dysfunction.  Echo without significant structural abnormality.  CT head without acute intracranial process.  Carotid artery ultrasound reported without significant findings.  Recent MRI of the brain at the end of 10/2022 was without acute process.  If the above Zio patch is unrevealing may need to consider noninvasive ischemic testing to evaluate for high risk ischemia.  Etiology such as pulmonary embolism is less likely given patient has been anticoagulated with apixaban .  Dizziness: Longstanding issue, present for approximately the past 3 years.  Workup more recently has been unrevealing including MRI of the brain, echo, and reported carotid artery ultrasound.  At baseline, he is bradycardic typically in the 50s bpm while awake on amiodarone  200 mg and Toprol -XL 25 mg daily.  Place Zio patch as outlined above.  Reduce Toprol -XL to 12.5 mg daily on day 4 of Zio patch.  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  Currently on amiodarone  200 mg and Toprol -XL 25 mg daily.  It is unclear at this time if there is some degree of symptomatic bradycardia/sinus node dysfunction.  Outpatient cardiac monitoring as outlined above.  We will reduce Toprol -XL to 12.5 mg daily on day 4 of Zio patch.  Per report at [facility] patient demonstrated  appropriate chronotropic competence on telemetry.  Patient is mildly bradycardic with a heart rate of 56 bpm in the office today, consistent with prior readings.  Unable to ambulate the patient in the office due to fractured metatarsal to assess for chronotropic competence.  This will be assessed on Zio patch.  No emergent indication for PPM at this time.  Potassium and magnesium  at goal.  TSH normal.  Sleep disordered breathing: Reports a long history of snoring and apneic episodes.  His bradycardia into the 40s bpm nocturnally is also suggestive of underlying sleep apnea.  Referred to pulmonary for sleep study.  CAD status post CABG: No symptoms suggestive of angina or cardiac decompensation.  High-sensitivity troponin negative x 2.  Echo with preserved LV systolic function.  Continue aggressive risk factor modification and secondary prevention including apixaban  and place of aspirin  given underlying A-fib as well as lower dose Toprol -XL as outlined above and his simvastatin .  PAF: Maintaining sinus rhythm with a mildly bradycardic rate on amiodarone  200 mg and now reduced dose Toprol -XL 12.5 mg daily after day 3 of his Zio patch as outlined above.  CHA2DS2-VASc at least 4 (HTN, age x  2, vascular disease).  Remains on apixaban  5 mg twice daily and does not meet reduced dosing criteria.  Recent Hgb stable.  PSVT: Subjectively quiescent.  Obtain a Zio patch to quantify burden and evaluate for posttermination pauses as outlined above.  For now, remains on amiodarone  and reduced dose of Toprol -XL as outlined above.  Aortic stenosis: Mild by echo at [facility] earlier this month.  Monitor with periodic echo.  At this point, noncontributory to dizziness or syncopal episode.  Dilated ascending aorta: Aorta was upper normal in size in the visualized segments at [facility] earlier this month.  Continue to monitor.  HTN: Blood pressure is mildly elevated at triage.  For now, continue to monitor given underlying bradycardic  rates.  HLD: LDL 73 in 09/2023.  Currently on simvastatin  40 mg.  CKD stage IIIa: Stable.  Follow-up with PCP.     Disposition: F/u with Dr. Gollan or an APP in 1 month.   Medication Adjustments/Labs and Tests Ordered: Current medicines are reviewed at length with the patient today.  Concerns regarding medicines are outlined above. Medication changes, Labs and Tests ordered today are summarized above and listed in the Patient Instructions accessible in Encounters.   Signed, Varney Gentleman, PA-C 11/20/2023 5:43 PM     [facility] [place] [address] [place], [place] [address] [phone]

## 2023-11-23 ENCOUNTER — Telehealth: Payer: Self-pay | Admitting: Cardiovascular Disease

## 2023-11-23 NOTE — Telephone Encounter (Signed)
 There is no further outpatient testing available to be done outside of wearing a heart monitor.  Agree with recommendations for the patient to be evaluated in the ER for bradycardia and syncope.

## 2023-11-23 NOTE — Telephone Encounter (Signed)
 Called and spoke with daughter about recommendations below.   Daughter also questioning about continuing Metoprolol - advised with Varney Gentleman, PA-C who advised to stop it.   Patient daughter verbalized understanding.

## 2023-11-23 NOTE — Telephone Encounter (Signed)
.  SYNCOPECHMG   Pt c/o Syncope: STAT if syncope occurred within 24 hours and pt complains of lightheadedness.   High Priority if episode of passing out, completely, today or in last 24 hours   1. Did you pass out today?   Yes   2. When is the last time you passed out?   Today around 8:45 am   3. Has this occurred multiple times?   Yes  4. Did you have any symptoms prior to passing out?  No  5. Did you fall? If so, are you on a blood thinner?   Daughter Russ Course) stated patient passed out yesterday twice.  Daughter noted patient's urine has been dark.  Daughter noted patient's BP has been normal but patient's HR has been low - HR 49 earlier this morning and now HR 53.

## 2023-11-23 NOTE — Telephone Encounter (Signed)
 Please see phone encounter.

## 2023-11-23 NOTE — Telephone Encounter (Signed)
 Received call via stat phone from daughter- states her father had another episode yesterday evening and this morning. Both times he was sitting down, and he came back very quickly. She states he felt fine this morning. BP/HR was 137/66, 128/73 HR was in the 40's. They called EMS, who did a EKG strip that showed bradycardia- after review of last OV note, we ordered a live zio which they have not received yet, however they were requesting an appointment. I advised that if patient was having episodes of syncope he should go to the ED, however patient daughter states he would not do that, but advised I would send a message over to PA to review further and give other recommendations.

## 2023-11-26 ENCOUNTER — Encounter: Payer: Self-pay | Admitting: Cardiovascular Disease

## 2023-11-26 DIAGNOSIS — I440 Atrioventricular block, first degree: Secondary | ICD-10-CM

## 2023-11-27 DIAGNOSIS — R55 Syncope and collapse: Secondary | ICD-10-CM | POA: Diagnosis not present

## 2023-11-28 NOTE — Telephone Encounter (Signed)
 Responded with Ryan's recommendations.   Awaiting okay from patient for EP referral.   Thanks!

## 2023-11-29 DIAGNOSIS — E538 Deficiency of other specified B group vitamins: Secondary | ICD-10-CM | POA: Diagnosis not present

## 2023-12-03 NOTE — Progress Notes (Unsigned)
 Electrophysiology Office Note:   Date:  12/04/2023  ID:  TRESEAN MATTIX, DOB Dec 07, 1940, MRN [id number]  Primary Cardiologist: Belva Boyden, MD Electrophysiologist: Ardeen Kohler, MD      History of Present Illness:   [name] is a 83 y.o. male with h/o CAD status post prior stenting status post CABG in 2016, PAF on apixaban , PSVT, mild aortic stenosis, CKD stage IIIa, syncope, HTN, and HLD who is being seen today for evaluation of syncope.  Discussed the use of AI scribe software for clinical note transcription with the patient, who gave verbal consent to proceed.  History of Present Illness [name] is an 83 year old male who presents with episodes of syncope and dizziness. He was referred by Dr. Claudius Cumins for evaluation of dizziness and syncope.  He experienced three episodes of syncope, the first occurring three Sundays ago. The initial episode happened in the bathroom after completing his morning routine, where he felt woozy, attempted to grab the counter, and subsequently lost consciousness, resulting in a fall that broke his foot. The next two episodes occurred while he was at his drawing board; during one, he was with his secretary who called 911 when he passed out. No recent syncope episodes have occurred since the initial three.  He has been experiencing dizziness for over a year, which he discussed with Dr. Claudius Cumins, leading to a referral for further evaluation. He underwent a brain MRI with contrast, and he recalls being told that the results were good and that his arteries were good. Following the syncope episodes, he was taken to the emergency room where further tests were conducted; he recalls that the staff seemed more concerned about what caused him to pass out than about his foot injury.  He is currently wearing a monitor to assess his heart's electrical activity. He feels dizzy and woozy every morning upon waking, a sensation that has persisted for over a year. He  needs to hold onto the bedpost and feels unstable when getting up to use the bathroom. He also notes experiencing sensations in his back that trigger dizziness.  He has a history of regular exercise, including walking on a treadmill, but has stopped due to dizziness. He mentions a past heart rate of around 60 bpm during his active years, but now experiences dizziness at various times of the day, which has impacted his ability to exercise.  He takes a water pill in the morning, which may contribute to his morning dizziness due to potential dehydration. His heart rate drops to the low forties at night and during the day, as observed by his [employer] watch. He has a history of open-heart surgery following a blockage discovered while he was still actively walking.   Review of systems complete and found to be negative unless listed in HPI.   EP Information / Studies Reviewed:    EKG is ordered today. Personal review as below.  EKG Interpretation Date/Time:  Tuesday Dec 04 2023 09:44:41 EDT Ventricular Rate:  57 PR Interval:  304 QRS Duration:  162 QT Interval:  506 QTC Calculation: 492 R Axis:   -55  Text Interpretation: Sinus bradycardia with 1st degree A-V block Right bundle branch block Left anterior fascicular block Bifascicular block When compared with ECG of 20-Nov-2023 16:47, No significant change was found Confirmed by Ardeen Kohler [phone]) on 12/04/2023 10:08:59 AM   Echo 08/2020:  1. Left ventricular ejection fraction, by estimation, is 50 to 55%. The  left ventricle has low normal  function. Left ventricular endocardial  border not optimally defined to evaluate regional wall motion. There is  moderate left ventricular hypertrophy.  Left ventricular diastolic parameters are consistent with Grade I  diastolic dysfunction (impaired relaxation).   2. Right ventricular systolic function is moderately reduced. The right  ventricular size is mildly enlarged. Tricuspid regurgitation signal  is  inadequate for assessing PA pressure.   3. Left atrial size was mildly dilated.   4. Right atrial size was mildly dilated.   5. The mitral valve is grossly normal. Mild mitral valve regurgitation.   6. The aortic valve has an indeterminant number of cusps. There is  moderate calcification of the aortic valve. There is moderate thickening  of the aortic valve. Aortic valve regurgitation is not visualized. Mild to  moderate aortic valve  sclerosis/calcification is present, without any evidence of aortic  stenosis.   7. Aortic dilatation noted. There is mild dilatation of the ascending  aorta, measuring 41 mm.   8. Mildly dilated pulmonary artery.       Physical Exam:   VS:  BP 138/76   Pulse (!) 57   Ht 6\' 1"  (1.854 m)   Wt 233 lb (105.7 kg)   SpO2 98%   BMI 30.74 kg/m    Wt Readings from Last 3 Encounters:  12/04/23 233 lb (105.7 kg)  11/20/23 225 lb (102.1 kg)  11/07/22 226 lb 4 oz (102.6 kg)     GEN: Well nourished, well developed in no acute distress NECK: No JVD CARDIAC: Bradycardic, regular.  Zio monitor over left chest. RESPIRATORY:  Clear to auscultation without rales, wheezing or rhonchi  ABDOMEN: Soft, non-distended EXTREMITIES:  No edema; No deformity   ASSESSMENT AND PLAN:    #. Syncope: Given his degree of baseline conduction disease, there is concern for heart block as etiology, although heart block has not yet been identified. He also endorses dizziness in the morning, upon standing to go to the bathroom after waking, which appears more consistent with orthostasis. - He is wearing a ZIO monitor currently.  We will allow him to complete the full monitoring duration and if no evidence of etiology for syncope then we will pursue loop recorder implant vs EP study +/- pacemaker.  #. RBBB #. LAFB #. First degree AV delay:  - Extensive baseline conduction disease.  We discussed possible exercise treadmill test, but unfortunately he cannot complete due to recent  injury to his foot.  In the presence of syncope, concerned that he may have intermittent complete heart block.  He is currently wearing a live ZIO monitor which has not showed heart block thus far.  He has another 6 days or so of monitoring remaining.  He will continue his scheduled course with ZIO then return for follow-up visit with me to review the results.  If results are unremarkable, then at that time we will pursue loop recorder implantation vs EP study +/- pacemaker.. - Avoid additional AV nodal blocking or bradycardia inducing agents.  #. Paroxysmal atrial fibrillation: #. Secondary hypercoagulable state due to atrial fibrillation: - Metoprolol  has been discontinued due to bradycardia.  We will continue amiodarone  200 mg once daily for rhythm control purposes for now. - Continue Eliquis  5 mg twice daily. - Monitor burden with Zio monitor that is currently in place.  If Zio monitor is unremarkable then we will pursue loop recorder implantation vs EP study +/- pacemaker.  Follow up with Dr. Daneil Dunker in 3-4 weeks.   Signed, Ardeen Kohler, MD

## 2023-12-04 ENCOUNTER — Encounter: Payer: Self-pay | Admitting: Cardiology

## 2023-12-04 ENCOUNTER — Ambulatory Visit: Attending: Cardiology | Admitting: Cardiology

## 2023-12-04 VITALS — BP 138/76 | HR 57 | Ht 73.0 in | Wt 233.0 lb

## 2023-12-04 DIAGNOSIS — R55 Syncope and collapse: Secondary | ICD-10-CM | POA: Diagnosis not present

## 2023-12-04 DIAGNOSIS — D6869 Other thrombophilia: Secondary | ICD-10-CM

## 2023-12-04 DIAGNOSIS — I4510 Unspecified right bundle-branch block: Secondary | ICD-10-CM

## 2023-12-04 DIAGNOSIS — I480 Paroxysmal atrial fibrillation: Secondary | ICD-10-CM

## 2023-12-04 DIAGNOSIS — I440 Atrioventricular block, first degree: Secondary | ICD-10-CM | POA: Diagnosis not present

## 2023-12-04 DIAGNOSIS — I444 Left anterior fascicular block: Secondary | ICD-10-CM | POA: Diagnosis not present

## 2023-12-04 NOTE — Patient Instructions (Signed)
 Medication Instructions:  Your physician recommends that you continue on your current medications as directed. Please refer to the Current Medication list given to you today.  *If you need a refill on your cardiac medications before your next appointment, please call your pharmacy*  Testing/Procedures: Loop Recorder Implant - this will be done at your next office visit. There are no restrictions or special instructions prior to this visit. Please note that you will not be able to shower for 72 hours after the procedure.   Follow-Up: At [facility], you and your health needs are our priority.  As part of our continuing mission to provide you with exceptional heart care, our providers are all part of one team.  This team includes your primary Cardiologist (physician) and Advanced Practice Providers or APPs (Physician Assistants and Nurse Practitioners) who all work together to provide you with the care you need, when you need it.  Your next appointment:   3 weeks  Provider:   Ardeen Kohler, MD

## 2023-12-06 DIAGNOSIS — S92514A Nondisplaced fracture of proximal phalanx of right lesser toe(s), initial encounter for closed fracture: Secondary | ICD-10-CM | POA: Diagnosis not present

## 2023-12-06 DIAGNOSIS — S92324A Nondisplaced fracture of second metatarsal bone, right foot, initial encounter for closed fracture: Secondary | ICD-10-CM | POA: Diagnosis not present

## 2023-12-17 DIAGNOSIS — H8111 Benign paroxysmal vertigo, right ear: Secondary | ICD-10-CM | POA: Diagnosis not present

## 2023-12-17 DIAGNOSIS — H9313 Tinnitus, bilateral: Secondary | ICD-10-CM | POA: Diagnosis not present

## 2023-12-17 DIAGNOSIS — H903 Sensorineural hearing loss, bilateral: Secondary | ICD-10-CM | POA: Diagnosis not present

## 2023-12-20 NOTE — Progress Notes (Signed)
 Cardiology Office Note    Date:  12/21/2023   ID:  RIOT WATERWORTH, DOB 08/31/1940, MRN [id number]  PCP:  Yehuda Helms, MD  Cardiologist:  Belva Boyden, MD  Electrophysiologist:  Ardeen Kohler, MD   Chief Complaint: Follow up  History of Present Illness:   [name] is a 83 y.o. male with history of CAD status post prior stenting status post CABG in 2016, PAF on apixaban , PSVT, mild aortic stenosis, CKD stage IIIa, syncope, HTN, and HLD who presents for follow-up of syncope.   In the early 2000's he underwent PCI to the LAD and RCA.  In 2016, he had recurrent angina with abnormal stress test.  LHC showed severe multivessel CAD leading to four-vessel CABG.  Postoperative course was notable for atrial fibrillation requiring short course of amiodarone .  However, when he was seen in follow-up in 2022 with fatigue he was noted to be in A-fib with RVR leading to discontinuation of aspirin  and clopidogrel  and he was initiated on apixaban .  Echo at that time showed an EF of 50 to 55%, moderate LVH, grade 1 diastolic dysfunction, moderately reduced RV systolic function with mildly enlarged ventricular cavity size, mild biatrial enlargement, mild mitral regurgitation, moderate calcification and thickening of the aortic valve with mild to moderate aortic sclerosis without evidence of stenosis, and mildly dilated ascending aorta measuring 41 mm.  Subsequent outpatient cardiac monitoring in 09/2020 showed a predominant rhythm of sinus with 53 episodes of SVT lasting up to 41 beats, and rare atrial and ventricular ectopy.  No evidence of A-fib.   At baseline he is typically very active and was walking on a treadmill on a regular basis up until development of intermittent dizziness approximately 3 years ago.  He would typically do around 100 push-ups as well as squats on a daily basis.   He reports passing out many years ago when he was a kid in the setting of influenza.  He reports a second  episode of syncope while working in [place], [place]  approximately 10 to 15 years ago after bending over looking into a manhole.  More recently, he reports an approximate 3-year history of intermittent dizziness that is not related to positional changes.  In this setting he underwent MRI of the brain in 10/2023 that showed no acute infarct with chronic small vessel changes.   He was admitted to [facility] in 11/2023 with syncopal episode.  He reports he had gotten up in the morning and performed his usual routine which includes using the restroom and taking a shower.  While exiting the restroom after taking a shower he turned around to weigh himself like he does each morning.  While on the scale, he rotated to step off and felt a sudden onset of dizziness with presyncope.  He grabbed onto the counter and subsequently suffered LOC.  He believes he was without consciousness for less than 1 minute.  No preceding chest pain or tachypalpitations.  Following this he was a little disoriented and unable to get himself to his feet.  He scooted on his buttocks into the bedroom and was able to get himself up off the floor after propping himself on a chair.  He then notified his daughter and son-in-law of this event and he was brought to [facility] ED for evaluation.  High-sensitivity troponin negative x 2.  EKG reported as sinus rhythm, 62 bpm, first-degree AV block, RBBB, LAFB.  Echo showed an EF of greater than 55%, moderately increased  LV wall thickness, grade 1 diastolic dysfunction, mild aortic stenosis, and normal RV systolic function and ventricular cavity size.  Presentation was suggestive of orthostatic hypotension.  There was concern for increased vagal tone during nocturnal hours.  Orthostatic vitals were normal when repeated after admission.  CT of the head was without acute intracranial process.  Patient and daughter report carotid artery ultrasound was reassuring (unavailable for review).  Outpatient Zio patch  was recommended.  He followed up in our office on 11/20/2023 and had been without further syncopal episodes.  He did continue to note intermittent dizziness.  ZIO AT was placed.  He was evaluated for syncope by Dr. Daneil Dunker with the EP on 12/04/2023 with recommendation to complete Zio patch monitoring and consider loop recorder if this was unrevealing.  Preliminary review of ZIO AT showed sinus rhythm with rare ectopy and no evidence of significant sustained arrhythmia, prolonged pauses, or evidence of high-grade AV block.  He comes in accompanied by family today and is doing well from a cardiac perspective.  No symptoms of angina or cardiac decompensation.  Feels much better off metoprolol .  Energy level feels like it is improving.  No near syncope or further syncope.  Does note some dizziness upon waking first thing in the morning.  No falls or symptoms concerning for bleeding.  Main limiting factor at this time is back pain which may be contributing to his elevated BP readings.  He feels like if it was not for his back pain he would be back to prior baseline and able to resume his prior walking regimen.  He is scheduled to see EP later this month for consideration of ILR.   Labs independently reviewed: 11/2023 - Hgb 11.9, PLT 132, potassium 4, BUN 27, serum creatinine 1.28, albumin  4, AST/ALT normal, magnesium  2.3 09/2023 - TC 125, TG 47, HDL 42, LDL 73, TSH normal  Past Medical History:  Diagnosis Date   Arthritis    CAD (coronary artery disease)    a. s/p DES to LAD and RCA (2001/2005);  b. 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   ED (erectile dysfunction) of non-organic origin    Essential hypertension    History of kidney stones    History of pneumonia    Hyperlipidemia, mixed    Kidney stones    Leg cramps    Myocardial infarction (HCC) 2000   Post-operative atrial fibrillation    a. 04/2015 following CABG.    Past Surgical History:  Procedure Laterality Date    BACK SURGERY     2 lunbar surgeries   CARDIAC CATHETERIZATION N/A 04/30/2015   Procedure: Left Heart Cath;  Surgeon: Devorah Fonder, MD;  Location: [facility] INVASIVE CV LAB;  Service: Cardiovascular;  Laterality: N/A;   COLONOSCOPY WITH PROPOFOL  N/A 08/22/2017   Procedure: COLONOSCOPY WITH PROPOFOL ;  Surgeon: Toledo, Alphonsus Jeans, MD;  Location: [facility] ENDOSCOPY;  Service: Gastroenterology;  Laterality: N/A;   CORONARY ANGIOPLASTY     3 stents   CORONARY ARTERY BYPASS GRAFT N/A 05/10/2015   Procedure: CORONARY ARTERY BYPASS GRAFTING (CABG), ON PUMP, TIMES FOUR, USING LEFT INTERNAL MAMMARY ARTERY, BILATERAL GREATER SAPHENOUS VEINS HARVESTED ENDOSCOPICALLY, WITH CORONARY ENDARTERECTOMY;  Surgeon: Heriberto London, MD;  Location: [facility] OR;  Service: Open Heart Surgery;  Laterality: N/A;   RETINAL DETACHMENT SURGERY Left    TEE WITHOUT CARDIOVERSION N/A 05/10/2015   Procedure: TRANSESOPHAGEAL ECHOCARDIOGRAM (TEE);  Surgeon: Heriberto London, MD;  Location: [facility] OR;  Service: Open Heart Surgery;  Laterality:  N/A;   TOOTH EXTRACTION      Current Medications: Current Meds  Medication Sig   acetaminophen  (TYLENOL ) 500 MG tablet Take 1,000 mg by mouth.   amiodarone  (PACERONE ) 200 MG tablet Take 200 mg by mouth daily.   clobetasol cream (TEMOVATE) 0.05 % SMARTSIG:1 Topical Every Night   cyanocobalamin (VITAMIN B12) 1000 MCG/ML injection Inject 1,000 mcg into the muscle every 30 (thirty) days.   diazepam  (VALIUM ) 5 MG tablet TAKE 1 TABLET (5 MG TOTAL) BY MOUTH EVERY 8 (EIGHT) HOURS AS NEEDED FOR ANXIETY FOR UP TO 10 DAYS   ELIQUIS  5 MG TABS tablet TAKE 1 TABLET BY MOUTH TWICE A DAY   fexofenadine (ALLEGRA) 180 MG tablet Take 180 mg by mouth as needed.    furosemide  (LASIX ) 20 MG tablet TAKE 1 TABLET (20 MG TOTAL) BY MOUTH DAILY. TAKE 5 DAYS A WEEK (HOLD ON THE WEEKENDS)   ibuprofen (ADVIL) 600 MG tablet Take 600 mg by mouth every 4 (four) hours as needed.   losartan  (COZAAR ) 25 MG tablet TAKE 1 TABLET (25 MG  TOTAL) BY MOUTH DAILY.   simvastatin  (ZOCOR ) 40 MG tablet Take 1 tablet (40 mg total) by mouth daily.    Allergies:   Codeine and Lisinopril    Social History   Socioeconomic History   Marital status: Widowed    Spouse name: Not on file   Number of children: Not on file   Years of education: Not on file   Highest education level: Not on file  Occupational History   Occupation: OWNER    Employer: [employer]    Comment: Full time  Tobacco Use   Smoking status: Never   Smokeless tobacco: Never   Tobacco comments:    tobacco use- no  Vaping Use   Vaping status: Never Used  Substance and Sexual Activity   Alcohol use: No   Drug use: No   Sexual activity: Not on file  Other Topics Concern   Not on file  Social History Narrative   Full time. Married. Gets regular exercise.    Social Drivers of Corporate investment banker Strain: Low Risk  (11/16/2023)   Received from [facility]   Overall Financial Resource Strain (CARDIA)    Difficulty of Paying Living Expenses: Not hard at all  Food Insecurity: No Food Insecurity (11/16/2023)   Received from [facility]   Hunger Vital Sign    Within the past 12 months, you worried that your food would run out before you got the money to buy more.: Never true    Within the past 12 months, the food you bought just didn't last and you didn't have money to get more.: Never true  Transportation Needs: No Transportation Needs (11/16/2023)   Received from [facility] - Transportation    In the past 12 months, has lack of transportation kept you from medical appointments or from getting medications?: No    Lack of Transportation (Non-Medical): No  Physical Activity: Not on file  Stress: Not on file  Social Connections: Not on file     Family History:  The patient's Family history is unknown by patient.  ROS:   12-point review of systems is negative unless otherwise noted  in the HPI.   EKGs/Labs/Other Studies Reviewed:    Studies reviewed were summarized above. The additional studies were reviewed today:  2D echo 11/12/2023 [facility]): Summary   1. The left ventricle is normal in  size with moderately increased wall  thickness.   2. The left ventricular systolic function is normal, LVEF is visually  estimated at > 55%.    3. There is grade I diastolic dysfunction (impaired relaxation).    4. There is mild aortic valve stenosis.    5. The right ventricle is normal in size, with normal systolic function.  __________   Zio patch 09/2020: Predominant underlying rhythm was Sinus Rhythm Patient had a min HR of 49 bpm, max HR of 136 bpm, and avg HR of 65 bpm.    53 Supraventricular Tachycardia/atrial tachycardia runs occurred, the run with the fastest interval lasting 41 beats  with a max rate of 136 bpm, the longest lasting 41 beats with an avg rate of 103 bpm.   Isolated SVEs were rare (<1.0%), SVE Couplets were rare (<1.0%), and SVE Triplets were rare (<1.0%). Isolated VEs were rare (<1.0%), VE Couplets were rare (<1.0%), and  no VE Triplets were present. Ventricular Bigeminy and Trigeminy were present.   No patient triggered events noted __________   2D echo 08/24/2020: 1. Left ventricular ejection fraction, by estimation, is 50 to 55%. The  left ventricle has low normal function. Left ventricular endocardial  border not optimally defined to evaluate regional wall motion. There is  moderate left ventricular hypertrophy.  Left ventricular diastolic parameters are consistent with Grade I  diastolic dysfunction (impaired relaxation).   2. Right ventricular systolic function is moderately reduced. The right  ventricular size is mildly enlarged. Tricuspid regurgitation signal is  inadequate for assessing PA pressure.   3. Left atrial size was mildly dilated.   4. Right atrial size was mildly dilated.   5. The mitral valve is grossly normal. Mild mitral valve  regurgitation.   6. The aortic valve has an indeterminant number of cusps. There is  moderate calcification of the aortic valve. There is moderate thickening  of the aortic valve. Aortic valve regurgitation is not visualized. Mild to  moderate aortic valve  sclerosis/calcification is present, without any evidence of aortic  stenosis.   7. Aortic dilatation noted. There is mild dilatation of the ascending  aorta, measuring 41 mm.   8. Mildly dilated pulmonary artery.  __________   See CV Studies and Care Everywhere in Epic for more remote imaging   EKG:  EKG is not ordered today.    Recent Labs: No results found for requested labs within last 365 days.  Recent Lipid Panel No results found for: CHOL, TRIG, HDL, CHOLHDL, VLDL, LDLCALC, LDLDIRECT  PHYSICAL EXAM:    VS:  BP (!) 160/82 (BP Location: Left Arm, Patient Position: Sitting, Cuff Size: Normal)   Pulse (!) 58   Ht 6' (1.829 m)   Wt 234 lb 4 oz (106.3 kg)   SpO2 97%   BMI 31.77 kg/m   BMI: Body mass index is 31.77 kg/m.  Physical Exam Vitals reviewed.  Constitutional:      Appearance: He is well-developed.  HENT:     Head: Normocephalic and atraumatic.   Eyes:     General:        Right eye: No discharge.        Left eye: No discharge.    Cardiovascular:     Rate and Rhythm: Normal rate and regular rhythm.     Heart sounds: S1 normal and S2 normal. Heart sounds not distant. No midsystolic click and no opening snap. Murmur heard.     Systolic murmur is present with a grade of  1/6 at the upper right sternal border.     No friction rub.  Pulmonary:     Effort: Pulmonary effort is normal. No respiratory distress.     Breath sounds: Normal breath sounds. No decreased breath sounds, wheezing, rhonchi or rales.  Chest:     Chest wall: No tenderness.   Musculoskeletal:     Cervical back: Normal range of motion.   Skin:    General: Skin is warm and dry.     Nails: There is no clubbing.    Neurological:     Mental Status: He is alert and oriented to person, place, and time.   Psychiatric:        Speech: Speech normal.        Behavior: Behavior normal.        Thought Content: Thought content normal.        Judgment: Judgment normal.     Wt Readings from Last 3 Encounters:  12/21/23 234 lb 4 oz (106.3 kg)  12/04/23 233 lb (105.7 kg)  11/20/23 225 lb (102.1 kg)     ASSESSMENT & PLAN:   Syncope/dizziness/bradycardia with first-degree AV block and RBBB: Overall, symptoms are much improved off metoprolol .  Preliminary review of Zio patch shows no evidence of high-grade AV block, prolonged pauses, or significant sustained arrhythmias.  Continue to avoid AV nodal blocking agents.  He has follow-up later this month with EP for consideration of ILR.  Sleep disordered breathing: Has been referred to pulmonology for sleep study.  This was not discussed at today's visit.  CAD status post CABG: No symptoms suggestive of angina or cardiac decompensation.  Continue aggressive risk factor modification and secondary prevention including apixaban  in place of aspirin  to minimize bleeding risk as well as simvastatin .  No indication for ischemic testing at this time.  PAF: Maintaining sinus rhythm on recent Zio patch on amiodarone  200 mg daily.  No longer on metoprolol  as outlined above.  CHA2DS2-VASc at least 4 (HTN, age x 2, vascular disease).  Remains on apixaban  5 mg twice daily and does not meet reduced dosing criteria.  No symptoms concerning for bleeding.  Recent liver function and thyroid normal.  PSVT: Quiescent.  No longer on metoprolol  as outlined above.  Remains on amiodarone  as outlined above.  Aortic stenosis: Mild by echo at [facility] in 11/2023.  Monitor with periodic echo.  Dilated ascending aorta: Aorta was upper normal in size in the visualized segments at [facility] in 11/2023.  Continue to monitor.  This was not discussed at today's visit.  HTN: Blood pressure is mildly elevated  in the office today, though this is likely in the setting of back pain.  Remains on losartan  25 mg daily.  HLD: LDL 73 in 09/2023.  Remains on simvastatin  40 mg.     Disposition: F/u with Dr. Gollan or an APP in 6 months, and EP as directed.    Medication Adjustments/Labs and Tests Ordered: Current medicines are reviewed at length with the patient today.  Concerns regarding medicines are outlined above. Medication changes, Labs and Tests ordered today are summarized above and listed in the Patient Instructions accessible in Encounters.   Signed, Varney Gentleman, PA-C 12/21/2023 12:35 PM     [facility] [place] [address] [place], [place] [address] [phone]

## 2023-12-21 ENCOUNTER — Encounter: Payer: Self-pay | Admitting: Physician Assistant

## 2023-12-21 ENCOUNTER — Ambulatory Visit: Attending: Physician Assistant | Admitting: Physician Assistant

## 2023-12-21 VITALS — BP 160/82 | HR 58 | Ht 72.0 in | Wt 234.2 lb

## 2023-12-21 DIAGNOSIS — I480 Paroxysmal atrial fibrillation: Secondary | ICD-10-CM

## 2023-12-21 DIAGNOSIS — I2510 Atherosclerotic heart disease of native coronary artery without angina pectoris: Secondary | ICD-10-CM

## 2023-12-21 DIAGNOSIS — E785 Hyperlipidemia, unspecified: Secondary | ICD-10-CM

## 2023-12-21 DIAGNOSIS — R42 Dizziness and giddiness: Secondary | ICD-10-CM

## 2023-12-21 DIAGNOSIS — I440 Atrioventricular block, first degree: Secondary | ICD-10-CM

## 2023-12-21 DIAGNOSIS — I4510 Unspecified right bundle-branch block: Secondary | ICD-10-CM | POA: Diagnosis not present

## 2023-12-21 DIAGNOSIS — I350 Nonrheumatic aortic (valve) stenosis: Secondary | ICD-10-CM

## 2023-12-21 DIAGNOSIS — Z951 Presence of aortocoronary bypass graft: Secondary | ICD-10-CM

## 2023-12-21 DIAGNOSIS — I77810 Thoracic aortic ectasia: Secondary | ICD-10-CM

## 2023-12-21 DIAGNOSIS — G4730 Sleep apnea, unspecified: Secondary | ICD-10-CM

## 2023-12-21 DIAGNOSIS — I10 Essential (primary) hypertension: Secondary | ICD-10-CM

## 2023-12-21 DIAGNOSIS — I471 Supraventricular tachycardia, unspecified: Secondary | ICD-10-CM

## 2023-12-21 DIAGNOSIS — R55 Syncope and collapse: Secondary | ICD-10-CM | POA: Diagnosis not present

## 2023-12-21 DIAGNOSIS — D6869 Other thrombophilia: Secondary | ICD-10-CM

## 2023-12-21 NOTE — Patient Instructions (Signed)
 Medication Instructions:  Your physician recommends that you continue on your current medications as directed. Please refer to the Current Medication list given to you today.   *If you need a refill on your cardiac medications before your next appointment, please call your pharmacy*  Lab Work: None ordered at this time   Follow-Up: At [facility], you and your health needs are our priority.  As part of our continuing mission to provide you with exceptional heart care, our providers are all part of one team.  This team includes your primary Cardiologist (physician) and Advanced Practice Providers or APPs (Physician Assistants and Nurse Practitioners) who all work together to provide you with the care you need, when you need it.  Your next appointment:   6 month(s)  Provider:   You may see Timothy Gollan, MD or Varney Gentleman, PA-C

## 2023-12-23 DIAGNOSIS — R55 Syncope and collapse: Secondary | ICD-10-CM | POA: Diagnosis not present

## 2023-12-24 ENCOUNTER — Ambulatory Visit: Payer: Self-pay | Admitting: Physician Assistant

## 2023-12-26 DIAGNOSIS — R42 Dizziness and giddiness: Secondary | ICD-10-CM | POA: Diagnosis not present

## 2024-01-01 ENCOUNTER — Ambulatory Visit: Admitting: Cardiology

## 2024-01-01 DIAGNOSIS — E538 Deficiency of other specified B group vitamins: Secondary | ICD-10-CM | POA: Diagnosis not present

## 2024-01-28 NOTE — Progress Notes (Unsigned)
 Electrophysiology Office Note:   Date:  01/30/2024  ID:  [name], DOB 08-28-1940, MRN [id number]  Primary Cardiologist: Douglas Lunger, MD Electrophysiologist: Douglas Kitty, MD      History of Present Illness:   [name] is a 83 y.o. male with h/o CAD status post prior stenting status post CABG in 2016, PAF on apixaban , PSVT, mild aortic stenosis, CKD stage IIIa, syncope, HTN, HLD and syncope who is being seen today for loop recorder implant at the request of [name], [name].  Discussed the use of AI scribe software for clinical note transcription with the patient, who gave verbal consent to proceed.  History of Present Illness He is an 83 year old male who presents for the scheduled placement of a loop recorder to evaluate episodes of syncope.  He had an episode of syncope, in the setting of his conduction disease, loop recorder was recommended at his last visit.  Patient was agreeable to this.  He has had no recurrent episodes since our last visit.  He reports feeling relatively well.  He has no new or acute complaints today.   Review of systems complete and found to be negative unless listed in HPI.   EP Information / Studies Reviewed:    EKG is ordered today. Personal review as below.  EKG Interpretation Date/Time:  Tuesday January 29 2024 11:45:53 EDT Ventricular Rate:  63 PR Interval:  264 QRS Duration:  154 QT Interval:  464 QTC Calculation: 474 R Axis:   -57  Text Interpretation: Sinus rhythm with 1st degree A-V block with occasional Premature ventricular complexes Right bundle branch block Left anterior fascicular block Bifascicular block When compared with ECG of 04-Dec-2023 09:44, Premature ventricular complexes are now Present Confirmed by Williamson Douglas [phone]) on 01/29/2024 11:48:52 AM   Zio 12/2023:        Physical Exam:   VS:  BP (!) 140/64 (BP Location: Left Arm, Patient Position: Sitting, Cuff Size: Normal)   Pulse 63   Ht 6' (1.829 m)   Wt 221  lb 12.8 oz (100.6 kg)   SpO2 93%   BMI 30.08 kg/m    Wt Readings from Last 3 Encounters:  01/29/24 221 lb 12.8 oz (100.6 kg)  12/21/23 234 lb 4 oz (106.3 kg)  12/04/23 233 lb (105.7 kg)     GEN: Well nourished, well developed in no acute distress NECK: No JVD CARDIAC: Normal rate, regular rhythm. RESPIRATORY:  Clear to auscultation without rales, wheezing or rhonchi  ABDOMEN: Soft, non-distended EXTREMITIES:  No edema; No deformity   ASSESSMENT AND PLAN:    #. Syncope: Discussed role of loop recorder and monitoring for a cause of syncope.  Patient and daughter are understanding of this.  Risk and benefits of loop recorder implant including but not limited to bleeding, pain, infection, damage to nearby structures were discussed with patient and his daughter.  Patient would like to proceed with planned loop recorder implant today.  #. RBBB #. LAFB #. First degree AV delay: - Monitor for more advanced conduction disease such as AV block with loop recorder.   #. Paroxysmal atrial fibrillation:  #. High risk medication use: Amiodarone  - LFTs normal 11/2023 and TSH normal 09/2023.  #. Hypercoagulable state due to AF:  -Continue Eliquis  5mg  BID.   #. CAD s/p CABG:   #. Hypertension -At goal today.  Recommend checking blood pressures 1-2 times per week at home and recording the values.  Recommend bringing these recordings to the primary  care physician.  SURGEON:  Douglas Kitty, MD     PREPROCEDURE DIAGNOSIS:  Syncope    POSTPROCEDURE DIAGNOSIS: Syncope     PROCEDURES:   1. Implantable loop recorder implantation    INTRODUCTION:  [name] presents with a history of syncope The costs of loop recorder monitoring have been discussed with the patient.    DESCRIPTION OF PROCEDURE:  Informed written consent was obtained.   Time Out Completed with RN    The patient required no sedation for the procedure today.  Mapping over the patient's chest was performed to identify the  area where electrograms were most prominent for ILR recording.  This area was found to be the left parasternal region over the 4th intercostal space. The patients left chest was therefore prepped and draped in the usual sterile fashion. The skin overlying the left parasternal region was infiltrated with lidocaine  for local analgesia.  A 0.5-cm incision was made over the left parasternal region over the 3rd intercostal space.  A subcutaneous ILR pocket was fashioned using a combination of sharp and blunt dissection.  A Medtronic Reveal LINQ 2 implantable loop recorder (serial # [id number]) was then placed into the pocket  R waves were very prominent and measuring 0.72mV.  Steri- Strips and a sterile dressing were then applied.  There were no early apparent complications.     CONCLUSIONS:   1. Successful implantation of a implantable loop recorder for a history of Syncope.  2. No early apparent complications.   Follow up with Dr. Kitty as needed.   Signed, Douglas Kitty, MD

## 2024-01-29 ENCOUNTER — Encounter: Payer: Self-pay | Admitting: Cardiology

## 2024-01-29 ENCOUNTER — Ambulatory Visit: Attending: Cardiology | Admitting: Cardiology

## 2024-01-29 VITALS — BP 140/64 | HR 63 | Ht 72.0 in | Wt 221.8 lb

## 2024-01-29 DIAGNOSIS — I440 Atrioventricular block, first degree: Secondary | ICD-10-CM

## 2024-01-29 DIAGNOSIS — I444 Left anterior fascicular block: Secondary | ICD-10-CM | POA: Diagnosis not present

## 2024-01-29 DIAGNOSIS — R55 Syncope and collapse: Secondary | ICD-10-CM

## 2024-01-29 DIAGNOSIS — I4510 Unspecified right bundle-branch block: Secondary | ICD-10-CM

## 2024-01-29 DIAGNOSIS — Z951 Presence of aortocoronary bypass graft: Secondary | ICD-10-CM

## 2024-01-29 DIAGNOSIS — Z79899 Other long term (current) drug therapy: Secondary | ICD-10-CM

## 2024-01-29 DIAGNOSIS — D6869 Other thrombophilia: Secondary | ICD-10-CM

## 2024-01-29 DIAGNOSIS — I480 Paroxysmal atrial fibrillation: Secondary | ICD-10-CM

## 2024-01-29 NOTE — Patient Instructions (Addendum)
 Medication Instructions:  Your physician recommends that you continue on your current medications as directed. Please refer to the Current Medication list given to you today.  Labwork: None ordered.  Testing/Procedures: None ordered.  Follow-Up:  Your physician wants you to follow-up as needed with Dr. Kennyth.    Implantable Loop Recorder Placement, Care After This sheet gives you information about how to care for yourself after your procedure. Your health care provider may also give you more specific instructions. If you have problems or questions, contact your health care provider. What can I expect after the procedure? After the procedure, it is common to have: Soreness or discomfort near the incision. Some swelling or bruising near the incision.  Follow these instructions at home: Incision care  Monitor your cardiac device site for redness, swelling, and drainage. Call the device clinic at [phone] if you experience these symptoms or fever/chills.  Keep the large square bandage on your site for 24 hours and then you may remove it yourself. Keep the steri-strips underneath in place.   You may shower after 72 hours / 3 days from your procedure with the steri-strips in place. They will usually fall off on their own, or may be removed after 10 days. Pat dry.   Avoid lotions, ointments, or perfumes over your incision until it is well-healed.  Please do not submerge in water until your site is completely healed.   Your device is MRI compatible.   Remote monitoring is used to monitor your cardiac device from home. This monitoring is scheduled every month by our office. It allows us  to keep an eye on the function of your device to ensure it is working properly.  If your wound site starts to bleed apply pressure.    For help with the monitor please call [employer] Monitor Support Specialist directly at [phone].    If you have any questions/concerns please call the device  clinic at [phone].  Activity  Return to your normal activities.  General instructions Follow instructions from your health care provider about how to manage your implantable loop recorder and transmit the information. Learn how to activate a recording if this is necessary for your type of device. You may go through a metal detection gate, and you may let someone hold a metal detector over your chest. Show your ID card if needed. Do not have an MRI unless you check with your health care provider first. Take over-the-counter and prescription medicines only as told by your health care provider. Keep all follow-up visits as told by your health care provider. This is important. Contact a health care provider if: You have redness, swelling, or pain around your incision. You have a fever. You have pain that is not relieved by your pain medicine. You have triggered your device because of fainting (syncope) or because of a heartbeat that feels like it is racing, slow, fluttering, or skipping (palpitations). Get help right away if you have: Chest pain. Difficulty breathing. Summary After the procedure, it is common to have soreness or discomfort near the incision. Change your dressing as told by your health care provider. Follow instructions from your health care provider about how to manage your implantable loop recorder and transmit the information. Keep all follow-up visits as told by your health care provider. This is important. This information is not intended to replace advice given to you by your health care provider. Make sure you discuss any questions you have with your health care provider. Document Released: 06/07/2015  Document Revised: 08/11/2017 Document Reviewed: 08/11/2017 Elsevier Patient Education  2020 [employer].

## 2024-02-03 ENCOUNTER — Other Ambulatory Visit: Payer: Self-pay | Admitting: Cardiovascular Disease

## 2024-02-08 ENCOUNTER — Other Ambulatory Visit: Payer: Self-pay | Admitting: Family Medicine

## 2024-02-08 DIAGNOSIS — R0602 Shortness of breath: Secondary | ICD-10-CM

## 2024-02-08 DIAGNOSIS — R053 Chronic cough: Secondary | ICD-10-CM

## 2024-02-14 ENCOUNTER — Ambulatory Visit
Admission: RE | Admit: 2024-02-14 | Discharge: 2024-02-14 | Disposition: A | Discharge status: Home / Self Care | Source: Ambulatory Visit | Attending: Family Medicine | Admitting: Family Medicine

## 2024-02-14 DIAGNOSIS — R0602 Shortness of breath: Secondary | ICD-10-CM | POA: Diagnosis present

## 2024-02-14 DIAGNOSIS — R053 Chronic cough: Secondary | ICD-10-CM | POA: Diagnosis present

## 2024-02-21 ENCOUNTER — Other Ambulatory Visit: Payer: Self-pay | Admitting: Family Medicine

## 2024-02-21 ENCOUNTER — Encounter: Payer: Self-pay | Admitting: Family Medicine

## 2024-02-21 DIAGNOSIS — M5416 Radiculopathy, lumbar region: Secondary | ICD-10-CM

## 2024-02-22 ENCOUNTER — Ambulatory Visit
Admission: RE | Admit: 2024-02-22 | Discharge: 2024-02-22 | Disposition: A | Discharge status: Home / Self Care | Source: Ambulatory Visit | Attending: Family Medicine | Admitting: Family Medicine

## 2024-02-22 DIAGNOSIS — M5416 Radiculopathy, lumbar region: Secondary | ICD-10-CM

## 2024-02-27 ENCOUNTER — Telehealth: Payer: Self-pay | Admitting: Cardiovascular Disease

## 2024-02-27 ENCOUNTER — Encounter: Payer: Self-pay | Admitting: Cardiovascular Disease

## 2024-02-27 NOTE — Telephone Encounter (Signed)
 Pt's daughter is calling to reiterate that pt will be in the hospital at time of remote pacer check and will not be able to complete the check.

## 2024-02-27 NOTE — Telephone Encounter (Signed)
 I called and spoke with the patient's daughter, Odetta (ok per DPR).  She was concerned the patient would need to come to the office for his pacer check that is scheduled on Friday 02/29/24. I advised Patty that the patient's check on Friday is remote and will transmit data during his sleeping hours on Friday.   Patty voices understanding and is agreeable.  She states the patient was recently admitted for pneumonia/ lung issues that were thought to be related to his amiodarone .   Patty states the patient is currently off amiodarone  and metoprolol . He is currently on a Prednisone taper- does is currently 40 mg once daily.   I advised Patty that we will continue to monitor the patient's remote transmissions.   Odetta was very appreciative of the call back.

## 2024-02-29 ENCOUNTER — Ambulatory Visit

## 2024-02-29 DIAGNOSIS — R55 Syncope and collapse: Secondary | ICD-10-CM

## 2024-03-03 LAB — CUP PACEART REMOTE DEVICE CHECK
Date Time Interrogation Session: 20250822205113
Implantable Pulse Generator Implant Date: 20250722

## 2024-03-09 ENCOUNTER — Ambulatory Visit: Payer: Self-pay | Admitting: Cardiology

## 2024-03-13 ENCOUNTER — Encounter: Payer: Self-pay | Admitting: Cardiovascular Disease

## 2024-03-13 NOTE — Telephone Encounter (Signed)
 Called and spoke with daughter per DPR. Daughter reports that patient has had chest pressure and fatigue since July. Daughter reports that patient was admitted to the hospital in August for pneumonia. Daughter states that patient's continues to have chest pressure and fatigue. She says that the patient has an appointment with Pulmonology on 03/25/24 but that she would like the patient to see Dr. Gollan to rule out any cardiac issues. Patient scheduled to see Dr. Gollon 03/18/24. Daughter made aware of ED precaution should any new symptoms develop or worsen.

## 2024-03-17 NOTE — Progress Notes (Unsigned)
 Cardiology Office Note  Date:  03/18/2024   ID:  [name], DOB Sep 30, 1940, MRN [id number]  PCP:  Auston Reyes BIRCH, MD   Chief Complaint  Patient presents with   Chest pressure & fatigue     Patient c/o shortness of breath, fatigue and chest pressure.     HPI:  [name] is a very pleasant 83 year old male with a history of coronary artery disease,  stenting of the LAD and right coronary artery with drug-eluting stents. Catheterization in April 2005, which showed moderate diffuse nonobstructive disease.   bypass surgery x 4 at [facility] 05/2015 For severe 3 vessel dz postoperative atrial fibrillation hypertension,  hyperlipidemia,  kidney stones Never smoked Hx of bronchitis/pulmonary fibrosis Chronic dizziness He presents today for follow-up of his coronary artery disease, paroxysmal atrial fibrillation  LOV with myself 4/24 11/11/23: lightheaded in bathroom,syncope Went down, broke foot Went to the ER ER concerned about metoprolol   Metoprolol  stopped, amiodarone  stopped  Seen in the clinic 12/21/23 Zio monitor ordered, no acute findings  Seen by Dr. Kennyth: loop placed Most recent download reviewed, no significant arrhythmia  Still with lightheadedness episodes, resolves quickly Can happen sitting or standing  Back pain from walking with cane  01/20/24  PNA: ABX, steroids Sx came back Another round of ABX: doxy, steroids Then levaquin, ended up in hospital, hypoxia CT scan chest 02/14/24: in hospital 5 days [facility] Followed by pulmonary  Echo 02/25/24:EF 60%  Anxiety on valium , better on valium   Labs reviewed Total chol 125,LDL 73 HGB 11.4 CR 1.07, BUN 28  EKG personally reviewed by myself on todays visit EKG Interpretation Date/Time:  Tuesday March 18 2024 11:27:16 EDT Ventricular Rate:  70 PR Interval:  244 QRS Duration:  148 QT Interval:  456 QTC Calculation: 492 R Axis:   -52  Text Interpretation: Sinus rhythm with 1st degree A-V block Right  bundle branch block Left anterior fascicular block Bifascicular block When compared with ECG of 29-Jan-2024 11:45, Premature ventricular complexes are no longer Present Confirmed by Perla Lye [phone]) on 03/18/2024 11:47:30 AM   Past medical history reviewed 08/20/20  in atrial fibrillation with RVR at 115 bpm.    started on Eliquis  5 mg twice daily due to CHA2DS2-VASc of at least 4 (agex2, CAD, hypertension).    Toprol   to 50 mg daily.    Echocardiogram 08/24/20 showing EF 50-55%, moderate LVH, grade 1 diastolic dysfunction, RV SF moderately reduced, RV mildly enlarged, bilateral atrium mildly dilated, mild MR, mild to moderate aortic valve sclerosis without stenosis, mild dilation of ascending aorta 41 mm, mildly dilated pulmonary artery.   He is been seen by pulmonary, had CT scan 02/08/2015, showing mild interstitial change  positive stress test (anterior wall ) at the end of 2016 (for chest pain and shortness of breath) leading to cardiac catheterization showing three-vessel disease, severe ostial LAD disease Sent to [facility] for a CABG He had postoperative atrial fibrillation   Myoview in April 2008 which showed a 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 EF 54%.   severe diarrhea October 2015 and was put in the hospital for dehydration He developed the flu in February 2016   History of chronic dizziness which he attributes to in her ear problem.  he is working full-time job.    PMH:   has a past medical history of Arthritis, CAD (coronary artery disease), ED (erectile dysfunction) of non-organic origin, Essential hypertension, History of kidney stones, History of pneumonia, Hyperlipidemia, mixed, Kidney stones, Leg cramps,  Myocardial infarction (HCC) (2000), and Post-operative atrial fibrillation.  PSH:    Past Surgical History:  Procedure Laterality Date   BACK SURGERY     2 lunbar surgeries   CARDIAC CATHETERIZATION N/A 04/30/2015   Procedure: Left Heart Cath;   Surgeon: Evalene JINNY Lunger, MD;  Location: [facility] INVASIVE CV LAB;  Service: Cardiovascular;  Laterality: N/A;   COLONOSCOPY WITH PROPOFOL  N/A 08/22/2017   Procedure: COLONOSCOPY WITH PROPOFOL ;  Surgeon: Toledo, Ladell POUR, MD;  Location: [facility] ENDOSCOPY;  Service: Gastroenterology;  Laterality: N/A;   CORONARY ANGIOPLASTY     3 stents   CORONARY ARTERY BYPASS GRAFT N/A 05/10/2015   Procedure: CORONARY ARTERY BYPASS GRAFTING (CABG), ON PUMP, TIMES FOUR, USING LEFT INTERNAL MAMMARY ARTERY, BILATERAL GREATER SAPHENOUS VEINS HARVESTED ENDOSCOPICALLY, WITH CORONARY ENDARTERECTOMY;  Surgeon: Maude Fleeta Ochoa, MD;  Location: [facility] OR;  Service: Open Heart Surgery;  Laterality: N/A;   RETINAL DETACHMENT SURGERY Left    TEE WITHOUT CARDIOVERSION N/A 05/10/2015   Procedure: TRANSESOPHAGEAL ECHOCARDIOGRAM (TEE);  Surgeon: Maude Fleeta Ochoa, MD;  Location: [facility] OR;  Service: Open Heart Surgery;  Laterality: N/A;   TOOTH EXTRACTION      Current Outpatient Medications  Medication Sig Dispense Refill   acetaminophen  (TYLENOL ) 500 MG tablet Take 1,000 mg by mouth.     clobetasol cream (TEMOVATE) 0.05 % SMARTSIG:1 Topical Every Night     cyanocobalamin  (VITAMIN B12) 1000 MCG/ML injection Inject 1,000 mcg into the muscle every 30 (thirty) days.     diazepam  (VALIUM ) 5 MG tablet TAKE 1 TABLET (5 MG TOTAL) BY MOUTH EVERY 8 (EIGHT) HOURS AS NEEDED FOR ANXIETY FOR UP TO 10 DAYS     ELIQUIS  5 MG TABS tablet TAKE 1 TABLET BY MOUTH TWICE A DAY 60 tablet 6   fexofenadine (ALLEGRA) 180 MG tablet Take 180 mg by mouth as needed.      furosemide  (LASIX ) 20 MG tablet TAKE 1 TABLET (20 MG TOTAL) BY MOUTH DAILY. TAKE 5 DAYS A WEEK (HOLD ON THE WEEKENDS) 180 tablet 1   ibuprofen (ADVIL) 600 MG tablet Take 600 mg by mouth every 4 (four) hours as needed.     losartan  (COZAAR ) 25 MG tablet TAKE 1 TABLET (25 MG TOTAL) BY MOUTH DAILY. 90 tablet 3   predniSONE (DELTASONE) 5 MG tablet Take 5 mg by mouth daily with breakfast.     simvastatin   (ZOCOR ) 40 MG tablet Take 1 tablet (40 mg total) by mouth daily. 90 tablet 2   No current facility-administered medications for this visit.   Allergies:   Codeine and Lisinopril    Social History:  The patient  reports that he has never smoked. He has never used smokeless tobacco. He reports that he does not drink alcohol and does not use drugs.   Family History:   Family history is unknown by patient.    Review of Systems: Review of Systems  Constitutional:  Positive for malaise/fatigue.  HENT: Negative.    Respiratory: Negative.    Cardiovascular: Negative.   Gastrointestinal: Negative.   Musculoskeletal:  Positive for back pain and joint pain.  Neurological: Negative.   Psychiatric/Behavioral: Negative.    All other systems reviewed and are negative.   PHYSICAL EXAM: VS:  BP 110/66 (BP Location: Left Arm, Patient Position: Sitting, Cuff Size: Normal)   Pulse 70   Ht 6' (1.829 m)   Wt 216 lb 4 oz (98.1 kg)   SpO2 92%   BMI 29.33 kg/m  , BMI Body mass index is 29.33  kg/m. Constitutional:  oriented to person, place, and time. No distress.  HENT:  Head: Normocephalic and atraumatic.  Eyes:  no discharge. No scleral icterus.  Neck: Normal range of motion. Neck supple. No JVD present.  Cardiovascular: Normal rate, regular rhythm, normal heart sounds and intact distal pulses. Exam reveals no gallop and no friction rub. No edema No murmur heard. Pulmonary/Chest: Effort normal and breath sounds normal. No stridor. No respiratory distress.  no wheezes.  no rales.  no tenderness.  Abdominal: Soft.  no distension.  no tenderness.  Musculoskeletal: Normal range of motion.  no  tenderness or deformity.  Neurological:  normal muscle tone. Coordination normal. No atrophy Skin: Skin is warm and dry. No rash noted. not diaphoretic.  Psychiatric:  normal mood and affect. behavior is normal. Thought content normal.   Recent Labs: No results found for requested labs within last 365 days.     Lipid Panel No results found for: CHOL, HDL, LDLCALC, TRIG    Wt Readings from Last 3 Encounters:  03/18/24 216 lb 4 oz (98.1 kg)  01/29/24 221 lb 12.8 oz (100.6 kg)  12/21/23 234 lb 4 oz (106.3 kg)      ASSESSMENT AND PLAN:  Essential hypertension -  Blood pressure is well controlled on today's visit. No changes made to the medications.  Atherosclerosis of native coronary artery of native heart with angina pectoris (HCC)  Currently with no symptoms of angina. No further workup at this time. Continue current medication regimen.  Hyperlipidemia LDL goal <70 - Plan: EKG 12-Lead Cholesterol is at goal on the current lipid regimen. No changes to the medications were made.  S/P CABG x 4 - Plan: EKG 12-Lead Nonsmoker, cholesterol at goal On eliquis , off asa and plavix   Shortness of breath Has not been exercising secondary to his foot, walks with a cane, deconditioned Has treadmill at home but unable to ambulate well Reports he continues to go to work daily  Paroxysmal atrial fibrillation Maintaining normal sinus rhythm Continue Eliquis ,  Metoprolol  amiodarone  previously held after syncope Loop monitor in place  Essential hypertension Blood pressure is well controlled on today's visit. No changes made to the medications. Rare orthostasis symptoms    Orders Placed This Encounter  Procedures   EKG 12-Lead     Signed, Velinda Lunger, M.D., Ph.D. 03/18/2024  [facility] [place], [place] [phone]

## 2024-03-18 ENCOUNTER — Ambulatory Visit: Attending: Cardiovascular Disease | Admitting: Cardiovascular Disease

## 2024-03-18 ENCOUNTER — Encounter: Payer: Self-pay | Admitting: Cardiovascular Disease

## 2024-03-18 VITALS — BP 110/66 | HR 70 | Ht 72.0 in | Wt 216.2 lb

## 2024-03-18 DIAGNOSIS — R42 Dizziness and giddiness: Secondary | ICD-10-CM

## 2024-03-18 DIAGNOSIS — Z951 Presence of aortocoronary bypass graft: Secondary | ICD-10-CM

## 2024-03-18 DIAGNOSIS — I480 Paroxysmal atrial fibrillation: Secondary | ICD-10-CM | POA: Diagnosis not present

## 2024-03-18 DIAGNOSIS — R55 Syncope and collapse: Secondary | ICD-10-CM | POA: Diagnosis not present

## 2024-03-18 DIAGNOSIS — I4510 Unspecified right bundle-branch block: Secondary | ICD-10-CM | POA: Diagnosis not present

## 2024-03-18 DIAGNOSIS — I444 Left anterior fascicular block: Secondary | ICD-10-CM | POA: Diagnosis not present

## 2024-03-18 DIAGNOSIS — I25118 Atherosclerotic heart disease of native coronary artery with other forms of angina pectoris: Secondary | ICD-10-CM

## 2024-03-18 NOTE — Patient Instructions (Signed)
 Medication Instructions:  ?No changes ? ?If you need a refill on your cardiac medications before your next appointment, please call your pharmacy.  ? ?Lab work: ?No new labs needed ? ?Testing/Procedures: ?No new testing needed ? ?Follow-Up: ?At [facility], you and your health needs are our priority.  As part of our continuing mission to provide you with exceptional heart care, we have created designated Provider Care Teams.  These Care Teams include your primary Cardiologist (physician) and Advanced Practice Providers (APPs -  Physician Assistants and Nurse Practitioners) who all work together to provide you with the care you need, when you need it. ? ?You will need a follow up appointment in 6 months, APP ok ? ?Providers on your designated Care Team:   ?Nicolasa Ducking, NP ?Eula Listen, PA-C ?Cadence Fransico Michael, PA-C ? ?COVID-19 Vaccine Information can be found at: [email or web address] For questions related to vaccine distribution or appointments, please email [email or web address] or call [phone].  ? ?

## 2024-03-26 NOTE — Progress Notes (Signed)
 Remote Loop Recorder Transmission

## 2024-03-31 ENCOUNTER — Ambulatory Visit (INDEPENDENT_AMBULATORY_CARE_PROVIDER_SITE_OTHER)

## 2024-03-31 DIAGNOSIS — R55 Syncope and collapse: Secondary | ICD-10-CM | POA: Diagnosis not present

## 2024-04-01 LAB — CUP PACEART REMOTE DEVICE CHECK
Date Time Interrogation Session: 20250922205114
Implantable Pulse Generator Implant Date: 20250722

## 2024-04-01 NOTE — Progress Notes (Signed)
 Remote Loop Recorder Transmission

## 2024-04-05 ENCOUNTER — Ambulatory Visit: Payer: Self-pay | Admitting: Cardiology

## 2024-04-30 ENCOUNTER — Encounter

## 2024-05-01 ENCOUNTER — Ambulatory Visit

## 2024-05-01 ENCOUNTER — Encounter

## 2024-05-01 DIAGNOSIS — R55 Syncope and collapse: Secondary | ICD-10-CM

## 2024-05-01 LAB — CUP PACEART REMOTE DEVICE CHECK
Date Time Interrogation Session: 20251022233951
Implantable Pulse Generator Implant Date: 20250722

## 2024-05-03 ENCOUNTER — Ambulatory Visit: Payer: Self-pay | Admitting: Cardiology

## 2024-05-03 NOTE — Progress Notes (Signed)
 Remote Loop Recorder Transmission

## 2024-05-31 ENCOUNTER — Encounter

## 2024-06-01 ENCOUNTER — Ambulatory Visit

## 2024-06-01 ENCOUNTER — Encounter

## 2024-06-04 ENCOUNTER — Ambulatory Visit: Attending: Cardiology

## 2024-06-04 ENCOUNTER — Other Ambulatory Visit: Payer: Self-pay | Admitting: Cardiovascular Disease

## 2024-06-04 DIAGNOSIS — R55 Syncope and collapse: Secondary | ICD-10-CM | POA: Diagnosis not present

## 2024-06-04 DIAGNOSIS — I4891 Unspecified atrial fibrillation: Secondary | ICD-10-CM

## 2024-06-04 NOTE — Telephone Encounter (Signed)
 Eliquis  5mg  refill request received. Patient is 83 years old, weight-98.1kg, Crea-1.4 on 05/08/24 via Care Everywhere from [place], Diagnosis-Afib, and last seen by Dr. Gollan on 03/18/24. Dose is appropriate based on dosing criteria. Will send in refill to requested pharmacy.

## 2024-06-06 LAB — CUP PACEART REMOTE DEVICE CHECK
Date Time Interrogation Session: 20251125234100
Implantable Pulse Generator Implant Date: 20250722

## 2024-06-09 NOTE — Progress Notes (Signed)
 Remote Loop Recorder Transmission

## 2024-06-27 ENCOUNTER — Ambulatory Visit: Payer: Self-pay | Admitting: Cardiology

## 2024-07-01 ENCOUNTER — Encounter

## 2024-07-02 ENCOUNTER — Ambulatory Visit

## 2024-07-03 ENCOUNTER — Encounter

## 2024-07-05 ENCOUNTER — Ambulatory Visit: Attending: Cardiology

## 2024-07-05 DIAGNOSIS — I4891 Unspecified atrial fibrillation: Secondary | ICD-10-CM

## 2024-07-07 LAB — CUP PACEART REMOTE DEVICE CHECK
Date Time Interrogation Session: 20251226232940
Implantable Pulse Generator Implant Date: 20250722

## 2024-07-13 ENCOUNTER — Ambulatory Visit: Payer: Self-pay | Admitting: Cardiology

## 2024-07-14 NOTE — Progress Notes (Signed)
 Remote Loop Recorder Transmission

## 2024-08-01 ENCOUNTER — Encounter

## 2024-08-02 ENCOUNTER — Encounter

## 2024-08-02 ENCOUNTER — Ambulatory Visit

## 2024-08-05 ENCOUNTER — Ambulatory Visit: Attending: Cardiology

## 2024-08-05 DIAGNOSIS — R55 Syncope and collapse: Secondary | ICD-10-CM

## 2024-08-05 LAB — CUP PACEART REMOTE DEVICE CHECK
Date Time Interrogation Session: 20260126234341
Implantable Pulse Generator Implant Date: 20250722

## 2024-08-10 ENCOUNTER — Ambulatory Visit: Payer: Self-pay | Admitting: Cardiology

## 2024-08-14 NOTE — Progress Notes (Signed)
 Remote Loop Recorder Transmission

## 2024-09-01 ENCOUNTER — Encounter

## 2024-09-02 ENCOUNTER — Encounter

## 2024-09-02 ENCOUNTER — Ambulatory Visit

## 2024-09-05 ENCOUNTER — Ambulatory Visit

## 2024-10-03 ENCOUNTER — Ambulatory Visit

## 2024-10-03 ENCOUNTER — Encounter

## 2024-10-06 ENCOUNTER — Ambulatory Visit

## 2024-11-03 ENCOUNTER — Ambulatory Visit

## 2024-11-06 ENCOUNTER — Ambulatory Visit

## 2024-12-04 ENCOUNTER — Ambulatory Visit

## 2024-12-07 ENCOUNTER — Ambulatory Visit

## 2025-01-07 ENCOUNTER — Ambulatory Visit

## 2025-02-07 ENCOUNTER — Ambulatory Visit

## 2025-03-10 ENCOUNTER — Ambulatory Visit

## 2025-04-10 ENCOUNTER — Ambulatory Visit

## 2025-05-11 ENCOUNTER — Ambulatory Visit

## 2025-06-11 ENCOUNTER — Ambulatory Visit

## 2025-07-12 ENCOUNTER — Ambulatory Visit
